# Patient Record
Sex: Female | Born: 1985
Health system: Southern US, Community
[De-identification: ages and names within clinical notes are randomized; demographics above are authoritative.]

---

## 2013-08-30 DIAGNOSIS — S82201B Unspecified fracture of shaft of right tibia, initial encounter for open fracture type I or II: Secondary | ICD-10-CM | POA: Insufficient documentation

## 2013-09-01 DIAGNOSIS — F1120 Opioid dependence, uncomplicated: Secondary | ICD-10-CM | POA: Insufficient documentation

## 2013-09-01 DIAGNOSIS — S93402A Sprain of unspecified ligament of left ankle, initial encounter: Secondary | ICD-10-CM | POA: Insufficient documentation

## 2013-09-01 DIAGNOSIS — S82209A Unspecified fracture of shaft of unspecified tibia, initial encounter for closed fracture: Secondary | ICD-10-CM | POA: Insufficient documentation

## 2019-05-26 ENCOUNTER — Emergency Department (HOSPITAL_COMMUNITY)
Admission: EM | Admit: 2019-05-26 | Discharge: 2019-05-26 | Disposition: A | Discharge status: Home / Self Care | Payer: Medicaid - Out of State | Attending: Emergency Medicine | Admitting: Emergency Medicine

## 2019-05-26 ENCOUNTER — Other Ambulatory Visit: Payer: Self-pay

## 2019-05-26 ENCOUNTER — Encounter (HOSPITAL_COMMUNITY): Payer: Self-pay | Admitting: Emergency Medicine

## 2019-05-26 DIAGNOSIS — Z5321 Procedure and treatment not carried out due to patient leaving prior to being seen by health care provider: Secondary | ICD-10-CM | POA: Insufficient documentation

## 2019-05-26 DIAGNOSIS — R519 Headache, unspecified: Secondary | ICD-10-CM | POA: Insufficient documentation
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
## 2019-05-27 ENCOUNTER — Encounter (HOSPITAL_COMMUNITY): Payer: Self-pay | Admitting: Emergency Medicine

## 2019-05-27 ENCOUNTER — Other Ambulatory Visit: Payer: Self-pay

## 2019-05-27 ENCOUNTER — Emergency Department (HOSPITAL_COMMUNITY)
Admission: EM | Admit: 2019-05-27 | Discharge: 2019-05-27 | Disposition: A | Discharge status: Home / Self Care | Payer: Medicaid - Out of State | Attending: Emergency Medicine | Admitting: Emergency Medicine

## 2019-05-27 DIAGNOSIS — R519 Headache, unspecified: Secondary | ICD-10-CM | POA: Diagnosis present

## 2019-05-27 DIAGNOSIS — Z5321 Procedure and treatment not carried out due to patient leaving prior to being seen by health care provider: Secondary | ICD-10-CM | POA: Insufficient documentation
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
## 2020-04-01 ENCOUNTER — Inpatient Hospital Stay (HOSPITAL_COMMUNITY)
Admission: EM | Admit: 2020-04-01 | Discharge: 2020-04-30 | DRG: 163 | Disposition: A | Discharge status: Home / Self Care | Payer: Medicaid Other | Attending: Internal Medicine | Admitting: Internal Medicine

## 2020-04-01 ENCOUNTER — Other Ambulatory Visit: Payer: Self-pay

## 2020-04-01 DIAGNOSIS — Z938 Other artificial opening status: Secondary | ICD-10-CM

## 2020-04-01 DIAGNOSIS — G629 Polyneuropathy, unspecified: Secondary | ICD-10-CM | POA: Diagnosis not present

## 2020-04-01 DIAGNOSIS — F32A Depression, unspecified: Secondary | ICD-10-CM | POA: Diagnosis present

## 2020-04-01 DIAGNOSIS — Z9689 Presence of other specified functional implants: Secondary | ICD-10-CM

## 2020-04-01 DIAGNOSIS — I071 Rheumatic tricuspid insufficiency: Secondary | ICD-10-CM | POA: Diagnosis present

## 2020-04-01 DIAGNOSIS — I313 Pericardial effusion (noninflammatory): Secondary | ICD-10-CM | POA: Diagnosis present

## 2020-04-01 DIAGNOSIS — F1510 Other stimulant abuse, uncomplicated: Secondary | ICD-10-CM | POA: Diagnosis present

## 2020-04-01 DIAGNOSIS — J90 Pleural effusion, not elsewhere classified: Secondary | ICD-10-CM

## 2020-04-01 DIAGNOSIS — J939 Pneumothorax, unspecified: Secondary | ICD-10-CM

## 2020-04-01 DIAGNOSIS — J850 Gangrene and necrosis of lung: Secondary | ICD-10-CM | POA: Diagnosis not present

## 2020-04-01 DIAGNOSIS — D75839 Thrombocytosis, unspecified: Secondary | ICD-10-CM

## 2020-04-01 DIAGNOSIS — U071 COVID-19: Secondary | ICD-10-CM

## 2020-04-01 DIAGNOSIS — J9811 Atelectasis: Secondary | ICD-10-CM | POA: Diagnosis present

## 2020-04-01 DIAGNOSIS — Z09 Encounter for follow-up examination after completed treatment for conditions other than malignant neoplasm: Secondary | ICD-10-CM

## 2020-04-01 DIAGNOSIS — R197 Diarrhea, unspecified: Secondary | ICD-10-CM | POA: Diagnosis present

## 2020-04-01 DIAGNOSIS — Z720 Tobacco use: Secondary | ICD-10-CM

## 2020-04-01 DIAGNOSIS — J9382 Other air leak: Secondary | ICD-10-CM | POA: Diagnosis not present

## 2020-04-01 DIAGNOSIS — E8809 Other disorders of plasma-protein metabolism, not elsewhere classified: Secondary | ICD-10-CM

## 2020-04-01 DIAGNOSIS — E871 Hypo-osmolality and hyponatremia: Secondary | ICD-10-CM

## 2020-04-01 DIAGNOSIS — F17210 Nicotine dependence, cigarettes, uncomplicated: Secondary | ICD-10-CM | POA: Diagnosis present

## 2020-04-01 DIAGNOSIS — J154 Pneumonia due to other streptococci: Secondary | ICD-10-CM | POA: Diagnosis present

## 2020-04-01 DIAGNOSIS — D539 Nutritional anemia, unspecified: Secondary | ICD-10-CM

## 2020-04-01 DIAGNOSIS — B953 Streptococcus pneumoniae as the cause of diseases classified elsewhere: Secondary | ICD-10-CM | POA: Diagnosis present

## 2020-04-01 DIAGNOSIS — Z681 Body mass index (BMI) 19 or less, adult: Secondary | ICD-10-CM

## 2020-04-01 DIAGNOSIS — R7989 Other specified abnormal findings of blood chemistry: Secondary | ICD-10-CM

## 2020-04-01 DIAGNOSIS — F1910 Other psychoactive substance abuse, uncomplicated: Secondary | ICD-10-CM | POA: Diagnosis present

## 2020-04-01 DIAGNOSIS — R9389 Abnormal findings on diagnostic imaging of other specified body structures: Secondary | ICD-10-CM | POA: Diagnosis present

## 2020-04-01 DIAGNOSIS — R634 Abnormal weight loss: Secondary | ICD-10-CM | POA: Diagnosis present

## 2020-04-01 DIAGNOSIS — F1990 Other psychoactive substance use, unspecified, uncomplicated: Secondary | ICD-10-CM | POA: Diagnosis present

## 2020-04-01 DIAGNOSIS — R7401 Elevation of levels of liver transaminase levels: Secondary | ICD-10-CM

## 2020-04-01 DIAGNOSIS — F1110 Opioid abuse, uncomplicated: Secondary | ICD-10-CM | POA: Diagnosis present

## 2020-04-01 DIAGNOSIS — R000 Tachycardia, unspecified: Secondary | ICD-10-CM

## 2020-04-01 DIAGNOSIS — E43 Unspecified severe protein-calorie malnutrition: Secondary | ICD-10-CM | POA: Insufficient documentation

## 2020-04-01 DIAGNOSIS — N644 Mastodynia: Secondary | ICD-10-CM | POA: Diagnosis not present

## 2020-04-01 DIAGNOSIS — F1410 Cocaine abuse, uncomplicated: Secondary | ICD-10-CM | POA: Diagnosis present

## 2020-04-01 DIAGNOSIS — J1282 Pneumonia due to coronavirus disease 2019: Secondary | ICD-10-CM | POA: Diagnosis present

## 2020-04-01 DIAGNOSIS — J9601 Acute respiratory failure with hypoxia: Secondary | ICD-10-CM | POA: Diagnosis present

## 2020-04-01 DIAGNOSIS — J869 Pyothorax without fistula: Principal | ICD-10-CM

## 2020-04-01 DIAGNOSIS — E222 Syndrome of inappropriate secretion of antidiuretic hormone: Secondary | ICD-10-CM | POA: Diagnosis present

## 2020-04-01 DIAGNOSIS — I959 Hypotension, unspecified: Secondary | ICD-10-CM

## 2020-04-01 DIAGNOSIS — J948 Other specified pleural conditions: Secondary | ICD-10-CM

## 2020-04-01 LAB — RESP PANEL BY RT-PCR (FLU A&B, COVID) ARPGX2
Influenza A by PCR: NEGATIVE
Influenza B by PCR: NEGATIVE
SARS Coronavirus 2 by RT PCR: POSITIVE — AB

## 2020-04-01 NOTE — ED Triage Notes (Signed)
Here with COVID s/s. Positive by affiliation- states that her family member in home was + 2 weeks ago. States that she has been having s/s for 6 days. Says that she has loss of taste and smell, cough, and has been feeling weak.

## 2020-04-02 ENCOUNTER — Inpatient Hospital Stay (HOSPITAL_COMMUNITY): Payer: Medicaid Other

## 2020-04-02 ENCOUNTER — Emergency Department (HOSPITAL_COMMUNITY): Payer: Medicaid Other

## 2020-04-02 ENCOUNTER — Encounter (HOSPITAL_COMMUNITY): Payer: Self-pay | Admitting: Internal Medicine

## 2020-04-02 DIAGNOSIS — J9811 Atelectasis: Secondary | ICD-10-CM | POA: Diagnosis present

## 2020-04-02 DIAGNOSIS — I959 Hypotension, unspecified: Secondary | ICD-10-CM | POA: Diagnosis present

## 2020-04-02 DIAGNOSIS — E8809 Other disorders of plasma-protein metabolism, not elsewhere classified: Secondary | ICD-10-CM

## 2020-04-02 DIAGNOSIS — F1990 Other psychoactive substance use, unspecified, uncomplicated: Secondary | ICD-10-CM | POA: Diagnosis present

## 2020-04-02 DIAGNOSIS — I071 Rheumatic tricuspid insufficiency: Secondary | ICD-10-CM | POA: Diagnosis present

## 2020-04-02 DIAGNOSIS — I313 Pericardial effusion (noninflammatory): Secondary | ICD-10-CM | POA: Diagnosis present

## 2020-04-02 DIAGNOSIS — R7989 Other specified abnormal findings of blood chemistry: Secondary | ICD-10-CM | POA: Diagnosis present

## 2020-04-02 DIAGNOSIS — Z681 Body mass index (BMI) 19 or less, adult: Secondary | ICD-10-CM | POA: Diagnosis not present

## 2020-04-02 DIAGNOSIS — E43 Unspecified severe protein-calorie malnutrition: Secondary | ICD-10-CM | POA: Diagnosis present

## 2020-04-02 DIAGNOSIS — J95812 Postprocedural air leak: Secondary | ICD-10-CM | POA: Diagnosis not present

## 2020-04-02 DIAGNOSIS — U071 COVID-19: Secondary | ICD-10-CM | POA: Diagnosis present

## 2020-04-02 DIAGNOSIS — J939 Pneumothorax, unspecified: Secondary | ICD-10-CM | POA: Diagnosis present

## 2020-04-02 DIAGNOSIS — J90 Pleural effusion, not elsewhere classified: Secondary | ICD-10-CM

## 2020-04-02 DIAGNOSIS — J948 Other specified pleural conditions: Secondary | ICD-10-CM | POA: Diagnosis present

## 2020-04-02 DIAGNOSIS — R000 Tachycardia, unspecified: Secondary | ICD-10-CM | POA: Diagnosis present

## 2020-04-02 DIAGNOSIS — J1282 Pneumonia due to coronavirus disease 2019: Secondary | ICD-10-CM | POA: Diagnosis present

## 2020-04-02 DIAGNOSIS — F32A Depression, unspecified: Secondary | ICD-10-CM | POA: Diagnosis present

## 2020-04-02 DIAGNOSIS — F1410 Cocaine abuse, uncomplicated: Secondary | ICD-10-CM | POA: Diagnosis present

## 2020-04-02 DIAGNOSIS — E871 Hypo-osmolality and hyponatremia: Secondary | ICD-10-CM

## 2020-04-02 DIAGNOSIS — J869 Pyothorax without fistula: Secondary | ICD-10-CM | POA: Diagnosis present

## 2020-04-02 DIAGNOSIS — I38 Endocarditis, valve unspecified: Secondary | ICD-10-CM | POA: Diagnosis not present

## 2020-04-02 DIAGNOSIS — Z8616 Personal history of COVID-19: Secondary | ICD-10-CM | POA: Diagnosis not present

## 2020-04-02 DIAGNOSIS — D539 Nutritional anemia, unspecified: Secondary | ICD-10-CM | POA: Diagnosis not present

## 2020-04-02 DIAGNOSIS — J154 Pneumonia due to other streptococci: Secondary | ICD-10-CM | POA: Diagnosis present

## 2020-04-02 DIAGNOSIS — Z9689 Presence of other specified functional implants: Secondary | ICD-10-CM | POA: Diagnosis not present

## 2020-04-02 DIAGNOSIS — D75839 Thrombocytosis, unspecified: Secondary | ICD-10-CM | POA: Diagnosis present

## 2020-04-02 DIAGNOSIS — J9601 Acute respiratory failure with hypoxia: Secondary | ICD-10-CM | POA: Diagnosis present

## 2020-04-02 DIAGNOSIS — E222 Syndrome of inappropriate secretion of antidiuretic hormone: Secondary | ICD-10-CM | POA: Diagnosis present

## 2020-04-02 DIAGNOSIS — R7401 Elevation of levels of liver transaminase levels: Secondary | ICD-10-CM | POA: Diagnosis not present

## 2020-04-02 DIAGNOSIS — F17210 Nicotine dependence, cigarettes, uncomplicated: Secondary | ICD-10-CM | POA: Diagnosis present

## 2020-04-02 DIAGNOSIS — J9382 Other air leak: Secondary | ICD-10-CM | POA: Diagnosis not present

## 2020-04-02 DIAGNOSIS — J850 Gangrene and necrosis of lung: Secondary | ICD-10-CM | POA: Diagnosis not present

## 2020-04-02 DIAGNOSIS — Z720 Tobacco use: Secondary | ICD-10-CM

## 2020-04-02 LAB — CBC WITH DIFFERENTIAL/PLATELET
Abs Immature Granulocytes: 0.19 10*3/uL — ABNORMAL HIGH (ref 0.00–0.07)
Basophils Absolute: 0.1 10*3/uL (ref 0.0–0.1)
Basophils Relative: 1 %
Eosinophils Absolute: 0 10*3/uL (ref 0.0–0.5)
Eosinophils Relative: 0 %
HCT: 36.2 % (ref 36.0–46.0)
Hemoglobin: 12.3 g/dL (ref 12.0–15.0)
Immature Granulocytes: 2 %
Lymphocytes Relative: 19 %
Lymphs Abs: 1.8 10*3/uL (ref 0.7–4.0)
MCH: 28.6 pg (ref 26.0–34.0)
MCHC: 34 g/dL (ref 30.0–36.0)
MCV: 84.2 fL (ref 80.0–100.0)
Monocytes Absolute: 0.7 10*3/uL (ref 0.1–1.0)
Monocytes Relative: 7 %
Neutro Abs: 6.8 10*3/uL (ref 1.7–7.7)
Neutrophils Relative %: 71 %
Platelets: 558 10*3/uL — ABNORMAL HIGH (ref 150–400)
RBC: 4.3 MIL/uL (ref 3.87–5.11)
RDW: 13.8 % (ref 11.5–15.5)
WBC: 9.5 10*3/uL (ref 4.0–10.5)
nRBC: 0 % (ref 0.0–0.2)

## 2020-04-02 LAB — COMPREHENSIVE METABOLIC PANEL
ALT: 41 U/L (ref 0–44)
AST: 50 U/L — ABNORMAL HIGH (ref 15–41)
Albumin: 2.4 g/dL — ABNORMAL LOW (ref 3.5–5.0)
Alkaline Phosphatase: 68 U/L (ref 38–126)
Anion gap: 10 (ref 5–15)
BUN: 15 mg/dL (ref 6–20)
CO2: 28 mmol/L (ref 22–32)
Calcium: 8.5 mg/dL — ABNORMAL LOW (ref 8.9–10.3)
Chloride: 87 mmol/L — ABNORMAL LOW (ref 98–111)
Creatinine, Ser: 0.69 mg/dL (ref 0.44–1.00)
GFR, Estimated: >60 mL/min (ref >60)
Glucose, Bld: 115 mg/dL — ABNORMAL HIGH (ref 70–99)
Potassium: 3.9 mmol/L (ref 3.5–5.1)
Sodium: 125 mmol/L — ABNORMAL LOW (ref 135–145)
Total Bilirubin: 0.7 mg/dL (ref 0.3–1.2)
Total Protein: 7.9 g/dL (ref 6.5–8.1)

## 2020-04-02 LAB — BODY FLUID CELL COUNT WITH DIFFERENTIAL
Eos, Fluid: 0 %
Lymphs, Fluid: 2 %
Monocyte-Macrophage-Serous Fluid: 33 % — ABNORMAL LOW (ref 50–90)
Neutrophil Count, Fluid: 65 % — ABNORMAL HIGH (ref 0–25)
Total Nucleated Cell Count, Fluid: 119595 cu mm — ABNORMAL HIGH (ref 0–1000)

## 2020-04-02 LAB — SEDIMENTATION RATE: Sed Rate: 98 mm/hr — ABNORMAL HIGH (ref 0–22)

## 2020-04-02 LAB — BASIC METABOLIC PANEL
Anion gap: 9 (ref 5–15)
Anion gap: 15 (ref 5–15)
BUN: 16 mg/dL (ref 6–20)
BUN: 21 mg/dL — ABNORMAL HIGH (ref 6–20)
CO2: 29 mmol/L (ref 22–32)
CO2: 26 mmol/L (ref 22–32)
Calcium: 8.4 mg/dL — ABNORMAL LOW (ref 8.9–10.3)
Calcium: 8.3 mg/dL — ABNORMAL LOW (ref 8.9–10.3)
Chloride: 87 mmol/L — ABNORMAL LOW (ref 98–111)
Chloride: 94 mmol/L — ABNORMAL LOW (ref 98–111)
Creatinine, Ser: 0.7 mg/dL (ref 0.44–1.00)
Creatinine, Ser: 0.7 mg/dL (ref 0.44–1.00)
GFR, Estimated: >60 mL/min (ref >60)
GFR, Estimated: >60 mL/min (ref >60)
Glucose, Bld: 110 mg/dL — ABNORMAL HIGH (ref 70–99)
Glucose, Bld: 96 mg/dL (ref 70–99)
Potassium: 4.3 mmol/L (ref 3.5–5.1)
Potassium: 3.9 mmol/L (ref 3.5–5.1)
Sodium: 125 mmol/L — ABNORMAL LOW (ref 135–145)
Sodium: 135 mmol/L (ref 135–145)

## 2020-04-02 LAB — PHOSPHORUS: Phosphorus: 4.2 mg/dL (ref 2.5–4.6)

## 2020-04-02 LAB — PROTEIN, PLEURAL OR PERITONEAL FLUID: Total protein, fluid: 5.3 g/dL

## 2020-04-02 LAB — D-DIMER, QUANTITATIVE: D-Dimer, Quant: 1.69 ug/mL-FEU — ABNORMAL HIGH (ref 0.00–0.50)

## 2020-04-02 LAB — CBG MONITORING, ED
Glucose-Capillary: 101 mg/dL — ABNORMAL HIGH (ref 70–99)
Glucose-Capillary: 116 mg/dL — ABNORMAL HIGH (ref 70–99)
Glucose-Capillary: 117 mg/dL — ABNORMAL HIGH (ref 70–99)

## 2020-04-02 LAB — GRAM STAIN

## 2020-04-02 LAB — OSMOLALITY: Osmolality: 277 mOsm/kg (ref 275–295)

## 2020-04-02 LAB — LACTATE DEHYDROGENASE, PLEURAL OR PERITONEAL FLUID: LD, Fluid: >10000 U/L — ABNORMAL HIGH (ref 3–23)

## 2020-04-02 LAB — OSMOLALITY, URINE: Osmolality, Ur: 277 mOsm/kg — ABNORMAL LOW (ref 300–900)

## 2020-04-02 LAB — GLUCOSE, PLEURAL OR PERITONEAL FLUID: Glucose, Fluid: 18 mg/dL

## 2020-04-02 LAB — GLUCOSE, CAPILLARY: Glucose-Capillary: 103 mg/dL — ABNORMAL HIGH (ref 70–99)

## 2020-04-02 LAB — MAGNESIUM: Magnesium: 1.8 mg/dL (ref 1.7–2.4)

## 2020-04-02 LAB — HIV ANTIBODY (ROUTINE TESTING W REFLEX): HIV Screen 4th Generation wRfx: NONREACTIVE

## 2020-04-02 MED ORDER — NICOTINE 14 MG/24HR TD PT24
14.0000 mg | MEDICATED_PATCH | Freq: Every day | TRANSDERMAL | Status: DC
  Administered 2020-04-02 – 2020-04-14 (×8): 14 mg via TRANSDERMAL
  Filled 2020-04-02 (×18): qty 1

## 2020-04-02 MED ORDER — HYDROXYZINE HCL 25 MG PO TABS: 25.0000 mg | ORAL_TABLET | Freq: Three times a day (TID) | ORAL | Status: DC | PRN

## 2020-04-02 MED ORDER — INSULIN ASPART 100 UNIT/ML ~~LOC~~ SOLN
0.0000 [IU] | Freq: Three times a day (TID) | SUBCUTANEOUS | Status: DC
  Administered 2020-04-05: 2 [IU] via SUBCUTANEOUS
  Administered 2020-04-06: 1 [IU] via SUBCUTANEOUS

## 2020-04-02 MED ORDER — ALBUTEROL SULFATE HFA 108 (90 BASE) MCG/ACT IN AERS
2.0000 | INHALATION_SPRAY | Freq: Four times a day (QID) | RESPIRATORY_TRACT | Status: DC
  Administered 2020-04-02 – 2020-04-12 (×39): 2 via RESPIRATORY_TRACT
  Filled 2020-04-02 (×2): qty 6.7

## 2020-04-02 MED ORDER — SODIUM CHLORIDE 0.9 % IV SOLN
100.0000 mg | Freq: Once | INTRAVENOUS | Status: AC
  Administered 2020-04-02: 100 mg via INTRAVENOUS
  Filled 2020-04-02: qty 20

## 2020-04-02 MED ORDER — KETOROLAC TROMETHAMINE 30 MG/ML IJ SOLN
30.0000 mg | Freq: Once | INTRAMUSCULAR | Status: AC
  Administered 2020-04-02: 30 mg via INTRAVENOUS
  Filled 2020-04-02: qty 1

## 2020-04-02 MED ORDER — DEXAMETHASONE SODIUM PHOSPHATE 10 MG/ML IJ SOLN
10.0000 mg | Freq: Once | INTRAMUSCULAR | Status: AC
  Administered 2020-04-02: 10 mg via INTRAVENOUS
  Filled 2020-04-02: qty 1

## 2020-04-02 MED ORDER — INSULIN ASPART 100 UNIT/ML ~~LOC~~ SOLN: 0.0000 [IU] | Freq: Every day | SUBCUTANEOUS | Status: DC

## 2020-04-02 MED ORDER — ALPRAZOLAM 0.5 MG PO TABS
0.5000 mg | ORAL_TABLET | Freq: Three times a day (TID) | ORAL | Status: DC | PRN
  Administered 2020-04-02 – 2020-04-29 (×47): 0.5 mg via ORAL
  Filled 2020-04-02 (×50): qty 1

## 2020-04-02 MED ORDER — PIPERACILLIN-TAZOBACTAM 3.375 G IVPB
3.3750 g | Freq: Three times a day (TID) | INTRAVENOUS | Status: DC
  Administered 2020-04-02 – 2020-04-05 (×8): 3.375 g via INTRAVENOUS
  Filled 2020-04-02 (×9): qty 50

## 2020-04-02 MED ORDER — ACETAMINOPHEN 325 MG PO TABS
650.0000 mg | ORAL_TABLET | Freq: Four times a day (QID) | ORAL | Status: DC | PRN
  Administered 2020-04-02: 650 mg via ORAL
  Filled 2020-04-02 (×3): qty 2

## 2020-04-02 MED ORDER — PREDNISONE 5 MG PO TABS
50.0000 mg | ORAL_TABLET | Freq: Every day | ORAL | Status: DC
  Administered 2020-04-05: 50 mg via ORAL
  Filled 2020-04-02: qty 2

## 2020-04-02 MED ORDER — GUAIFENESIN-DM 100-10 MG/5ML PO SYRP
10.0000 mL | ORAL_SOLUTION | ORAL | Status: DC | PRN
  Administered 2020-04-04 – 2020-04-07 (×6): 10 mL via ORAL
  Filled 2020-04-02 (×6): qty 10

## 2020-04-02 MED ORDER — VANCOMYCIN HCL 1250 MG/250ML IV SOLN: 1250.0000 mg | INTRAVENOUS | Status: DC

## 2020-04-02 MED ORDER — SODIUM CHLORIDE 0.9 % IV SOLN: 100.0000 mg | Freq: Every day | INTRAVENOUS | Status: DC

## 2020-04-02 MED ORDER — ENSURE ENLIVE PO LIQD
237.0000 mL | Freq: Two times a day (BID) | ORAL | Status: DC
  Filled 2020-04-02 (×8): qty 237

## 2020-04-02 MED ORDER — PIPERACILLIN-TAZOBACTAM 3.375 G IVPB 30 MIN
3.3750 g | Freq: Once | INTRAVENOUS | Status: AC
  Administered 2020-04-02: 3.375 g via INTRAVENOUS
  Filled 2020-04-02: qty 50

## 2020-04-02 MED ORDER — IOHEXOL 350 MG/ML SOLN
100.0000 mL | Freq: Once | INTRAVENOUS | Status: AC | PRN
  Administered 2020-04-02: 100 mL via INTRAVENOUS

## 2020-04-02 MED ORDER — METHYLPREDNISOLONE SODIUM SUCC 40 MG IJ SOLR
0.5000 mg/kg | Freq: Two times a day (BID) | INTRAMUSCULAR | Status: AC
  Administered 2020-04-02 – 2020-04-04 (×6): 26 mg via INTRAVENOUS
  Filled 2020-04-02 (×7): qty 1

## 2020-04-02 MED ORDER — VANCOMYCIN HCL 500 MG/100ML IV SOLN
500.0000 mg | Freq: Two times a day (BID) | INTRAVENOUS | Status: DC
  Administered 2020-04-03: 500 mg via INTRAVENOUS
  Filled 2020-04-02 (×6): qty 100

## 2020-04-02 MED ORDER — VANCOMYCIN HCL 1250 MG/250ML IV SOLN
1250.0000 mg | Freq: Once | INTRAVENOUS | Status: AC
  Administered 2020-04-02: 1250 mg via INTRAVENOUS
  Filled 2020-04-02: qty 250

## 2020-04-02 MED ORDER — SODIUM CHLORIDE 0.9 % IV SOLN
100.0000 mg | Freq: Every day | INTRAVENOUS | Status: AC
  Administered 2020-04-03 – 2020-04-06 (×4): 100 mg via INTRAVENOUS
  Filled 2020-04-02 (×3): qty 20
  Filled 2020-04-02: qty 2.78

## 2020-04-02 MED ORDER — ALBUTEROL SULFATE HFA 108 (90 BASE) MCG/ACT IN AERS
2.0000 | INHALATION_SPRAY | RESPIRATORY_TRACT | Status: DC | PRN
  Administered 2020-04-04: 2 via RESPIRATORY_TRACT
  Filled 2020-04-02: qty 6.7

## 2020-04-02 MED ORDER — HYDROCOD POLST-CPM POLST ER 10-8 MG/5ML PO SUER
5.0000 mL | Freq: Two times a day (BID) | ORAL | Status: DC | PRN
  Administered 2020-04-03: 5 mL via ORAL
  Filled 2020-04-02: qty 5

## 2020-04-02 MED ORDER — DM-GUAIFENESIN ER 30-600 MG PO TB12
1.0000 | ORAL_TABLET | Freq: Two times a day (BID) | ORAL | Status: DC
  Administered 2020-04-02 – 2020-04-30 (×49): 1 via ORAL
  Filled 2020-04-02 (×54): qty 1

## 2020-04-02 MED ORDER — SODIUM CHLORIDE 0.9 % IV SOLN: 200.0000 mg | Freq: Once | INTRAVENOUS | Status: DC

## 2020-04-02 MED ORDER — SODIUM CHLORIDE 0.9 % IV SOLN: Freq: Once | INTRAVENOUS | Status: AC

## 2020-04-02 MED ORDER — PANTOPRAZOLE SODIUM 40 MG PO TBEC
40.0000 mg | DELAYED_RELEASE_TABLET | Freq: Every day | ORAL | Status: DC
  Administered 2020-04-03 – 2020-04-30 (×27): 40 mg via ORAL
  Filled 2020-04-02 (×28): qty 1

## 2020-04-02 MED ORDER — BUSPIRONE HCL 10 MG PO TABS
10.0000 mg | ORAL_TABLET | Freq: Three times a day (TID) | ORAL | Status: DC
  Administered 2020-04-02 – 2020-04-30 (×83): 10 mg via ORAL
  Filled 2020-04-02 (×2): qty 2
  Filled 2020-04-02: qty 1
  Filled 2020-04-02: qty 2
  Filled 2020-04-02 (×2): qty 1
  Filled 2020-04-02: qty 2
  Filled 2020-04-02 (×4): qty 1
  Filled 2020-04-02: qty 2
  Filled 2020-04-02 (×6): qty 1
  Filled 2020-04-02 (×2): qty 2
  Filled 2020-04-02 (×3): qty 1
  Filled 2020-04-02: qty 2
  Filled 2020-04-02 (×3): qty 1
  Filled 2020-04-02: qty 2
  Filled 2020-04-02 (×4): qty 1
  Filled 2020-04-02 (×6): qty 2
  Filled 2020-04-02 (×6): qty 1
  Filled 2020-04-02 (×3): qty 2
  Filled 2020-04-02: qty 1
  Filled 2020-04-02 (×3): qty 2
  Filled 2020-04-02 (×2): qty 1
  Filled 2020-04-02: qty 2
  Filled 2020-04-02 (×3): qty 1
  Filled 2020-04-02: qty 2
  Filled 2020-04-02 (×2): qty 1
  Filled 2020-04-02: qty 2
  Filled 2020-04-02 (×3): qty 1
  Filled 2020-04-02 (×3): qty 2
  Filled 2020-04-02: qty 1
  Filled 2020-04-02: qty 2
  Filled 2020-04-02 (×4): qty 1
  Filled 2020-04-02: qty 2
  Filled 2020-04-02: qty 1
  Filled 2020-04-02: qty 2
  Filled 2020-04-02: qty 1
  Filled 2020-04-02: qty 2
  Filled 2020-04-02 (×3): qty 1
  Filled 2020-04-02 (×2): qty 2

## 2020-04-02 MED ORDER — TRAZODONE HCL 50 MG PO TABS
100.0000 mg | ORAL_TABLET | Freq: Every day | ORAL | Status: DC
  Administered 2020-04-02 – 2020-04-29 (×28): 100 mg via ORAL
  Filled 2020-04-02: qty 2
  Filled 2020-04-02 (×2): qty 1
  Filled 2020-04-02: qty 2
  Filled 2020-04-02: qty 1
  Filled 2020-04-02 (×3): qty 2
  Filled 2020-04-02: qty 1
  Filled 2020-04-02 (×16): qty 2
  Filled 2020-04-02: qty 1
  Filled 2020-04-02: qty 2

## 2020-04-02 MED ORDER — LACTATED RINGERS IV BOLUS
1000.0000 mL | Freq: Once | INTRAVENOUS | Status: AC
  Administered 2020-04-02: 1000 mL via INTRAVENOUS

## 2020-04-02 NOTE — Progress Notes (Signed)
Patient planned for placement of large bore chest with moderate sedation for left empyema 04/03/20 at 0900 in CT at [facility] - patient may have bed available for today at [facility], however currently in [facility] ED.   Discussed patient with Casimiro Needle, RN at [facility] who confirms patient is planned for bed placement, she will also require midline placement due to poor venous access - in the event she is not able to receive a bed at [facility] prior to tomorrow's procedure she will need to be transported to [facility] via [facility] by 0900. She will need to be brought directly to CT1 for procedure given her COVID (+) status. Casimiro Needle, RN to setup [facility] so that we can proceed even if she is not transferred to [facility] before tomorrow's procedure. If she is brought to [facility] tonight we will call for patient when ready for 0900 procedure.  Patient to be NPO after midnight, hold anticoagulation until post procedure, AM labs ordered. IR PA will see for consult/consent when arrives to [facility].  Please call on call interventional radiologist with any questions or concerns overnight/weekend.  Lynnette Caffey, PA-C
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
## 2020-04-02 NOTE — Progress Notes (Addendum)
Pharmacy Antibiotic Note  Alyssa Vargas is a 34 y.o. female admitted on 04/01/2020 with pneumonia.  Pharmacy has been consulted for Zosyn dosing.  Plan: Vancomycin 1250 mg IV x 1 dose. Vancomycin 500 mg IV every 12 hours. Zosyn 3.375g IV every 8 hours. Monitor labs, c/s, and patient improvement.  Height: 5\' 8"  (172.7 cm) Weight: 52.2 kg (115 lb) IBW/kg (Calculated) : 63.9  Temp (24hrs), Avg:98.1 F (36.7 C), Min:98 F (36.7 C), Max:98.1 F (36.7 C)  Recent Labs  Lab 04/02/20 0158 04/02/20 0728  WBC 9.5  --   CREATININE 0.69 0.70    Estimated Creatinine Clearance: 83.2 mL/min (by C-G formula based on SCr of 0.7 mg/dL).    No Known Allergies  Antimicrobials this admission: Zosyn 12/17 >> Vanco 12/17 >> Remdesivir 12/17 >>    12/17 BCx >> pending 12/17 Pleural fluid: pending COVID +    Thank you for allowing pharmacy to be a part of this patient's care.  1/18 04/02/2020 1:33 PM
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
## 2020-04-02 NOTE — Progress Notes (Addendum)
Patient Active Problem List   Diagnosis Date Noted  . Massive left-sided empyema lung with mediastinal shift 04/02/2020    Priority: High  . 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 COVID-19 virus infection 04/02/2020  . Pleural effusion on left 04/02/2020  . Tobacco abuse 04/02/2020  . Hyponatremia 04/02/2020  . Hypoalbuminemia 04/02/2020  . Thrombocytosis 04/02/2020  . Elevated d-dimer 04/02/2020      Patient seen and evaluated, chart reviewed, please see EMR for updated orders. Please see full H&P dictated by admitting physician Dr. Thomes Dinning for same date of service.    Brief Summary:- 34 yo WF with H/o IVDU (iv Heroin and iv Methamphetamine) and Tobacco abuse admitted on 04/02/2020 with dyspnea, productive cough, loss of sense of taste and smell, anorexia, generalized aches, pains, malaise fatigue and myalgias and found to be positive for COVID-19 infection with chest x-ray consistent with very very large left-sided pleural effusion with mediastinal shift,-left-sided thoracentesis on 04/02/2020 you did purulent fluid consistent with empyema with Gram stain showing GPC -Pulmonology and CT surgery consult appreciated -Patient awaiting IR to place large bore chest tube under CT guidance  A/p 1)Large left-sided Empyema--- as per Dr Tyrone Sage from cardiothoracic surgery we need to consult IR to place large bore chest tube to drain purulent / empyema fluid--- no plans for VATS at this time -IR consult from Dr. Fredia Sorrow requested, -- pt is scheduled  for CT guided chest tube placement on 04/03/20 at 0900 am at [facility] -Pleural fluid studies pending,  --started on IV Zosyn and Vancomycin on 04/02/20 by Dr Sherene Sires (PCCM) -Blood cultures and transthoracic echo requested -Status post left-sided ultrasound-guided thoracentesis on 04/02/2020 with   1.88 L of cloudy tan particulate-containing purulent appearing  -Gram stain with  GPC -Given IVDU, low threshold for getting TEE -Transfer to [facility]  2)Covid 19 Infection--- this does NOT appear to be the primary driver for patient's respiratory symptoms at this time -We will check and trend inflammatory markers -Continue IV steroids and remdesivir as ordered by admitting physician -Continue bronchodilators -Supplemental oxygen as ordered  3) acute Hypoxic respiratory Failure--- mostly due to #1 above -Management as above #1 #2   4)Depression and Anorexia--- denies suicidal or homicidal ideation or plan, will benefit from TTS/behavioral health consult prior to discharge -xanax prn   5) hyponatremia--multifactorial, hydrate, recheck BMP in a.m.  6)H/o IVDU (iv Heroin and iv Methamphetamine) --- patient admits that she last used IV methamphetamine and IV heroin on Sunday, 12/28/2019--patient is Not interested in drug rehab at this time  7) Tobacco abuse --- nicotine patch requested  --Total care time over 43 minutes  --Case discussed with pulmonary critical care service Dr. Sandrea Hughs, cardiothoracic surgery service Dr. Tyrone Sage...   And interventional radiology Dr. Fredia Sorrow -- Patient seen and evaluated, chart reviewed, please see EMR for updated orders. Please see full H&P dictated by admitting physician Dr. Thomes Dinning for same date of service.   -

## 2020-04-02 NOTE — Consult Note (Signed)
NAME:  Alyssa Vargas, MRN:  [id number], DOB:  1986/03/19, LOS: 0 ADMISSION DATE:  04/01/2020, CONSULTATION DATE:  12/17 REFERRING MD:  Dondra Prader , CHIEF COMPLAINT:  Massive L effusion in covid Pos pt    Brief History:  34 yowf smoker sick x either 1 week or 4 weeks (not clear which) with sob on prsentation to er 12/16 with tension hydrothorax which proved to by empyema on Tap 12/17 so PCCM asked to consult   History of Present Illness:    34 y.o. female with medical history significant for tobacco abuse who presents to the emergency department due to 1 week of worsening shortness of breath associated with cough, loss of sense of taste with decreased appetite and generalized body aches. Patient states that she was exposed to someone with Covid, she complained of chills and sweats, but was unsure if she had fever. She states that she has lost about 18 pounds within 1 month.  H/o IV drug use noted         Past Medical History:   Smoker without obvious sequelae, on no meds PTA  Significant Hospital Events:    Consults:  PCCM  12/17  Procedures:  L Tecentesis  12/17 :    1880 cc  purulent fluid c/w empyema with glucose 18  Echo 12/17 >>>  Significant Diagnostic Tests:  CT chest pre tap 12/17  There is a massive left pleural effusion which completely fills the left hemithorax, completely collapses the left lung, and demonstrates marked mass effect upon the mediastinum with marked mediastinal shift to the right. There is interstitial gas within a portion of the left lower lobe likely representing the lateral segment of the left lower lobe which may represent necrosis of the setting of necrotizing pneumonia. Mild patchy ground-glass infiltrate within the a right upper lobe anteriorly is nonspecific, possibly infectious or inflammatory. A bilobed fluid density structure is seen within the right middle lobe measuring 2.3 x 3.6 x 1.6 cm in greatest dimension, nonspecific, but possibly  the sequela of prior infection or trauma.  Micro Data:  Resp viral panel  12/16   POS COVID  Tcentesis  2/17 >>> gpc's >>>  Antimicrobials:  Remdesovir 12/17 >>> Zosyn 12/17 >>> gpc's Vanc 12/17   Scheduled Meds: . albuterol  2 puff Inhalation Q6H  . dextromethorphan-guaiFENesin  1 tablet Oral BID  . feeding supplement  237 mL Oral BID BM  . insulin aspart  0-5 Units Subcutaneous QHS  . insulin aspart  0-9 Units Subcutaneous TID WC  . methylPREDNISolone (SOLU-MEDROL) injection  0.5 mg/kg Intravenous Q12H   Followed by  . [START ON 04/05/2020] predniSONE  50 mg Oral Daily  . pantoprazole  40 mg Oral Daily   Continuous Infusions: . [START ON 04/03/2020] remdesivir 100 mg in NS 100 mL     PRN Meds:.acetaminophen, albuterol, chlorpheniramine-HYDROcodone, guaiFENesin-dextromethorphan    Interim History / Subjective:  Able to lie flat in fetal position L side down p tap today   Objective   Blood pressure 103/76, pulse (!) 110, temperature 98.1 F (36.7 C), temperature source Oral, resp. rate (!) 25, height 5\' 8"  (1.727 m), weight 52.2 kg, SpO2 95 %.        Intake/Output Summary (Last 24 hours) at 04/02/2020 1223 Last data filed at 04/02/2020 0824 Gross per 24 hour  Intake 5461.2 ml  Output --  Net 5461.2 ml   Filed Weights   04/01/20 2125  Weight: 52.2 kg    Examination: Tmax  98.1  General: chronically > acutely ill Lungs: decreaesed bs on L  Cardiovascular: RRR  Abdomen: soft Extremities: warm s edema Neuro: intact      I personally reviewed images and agree with radiology impression as follows:  CXR:   12/17 p tap portable: No pneumothorax following LEFT thoracentesis.  Resolution of LEFT to RIGHT mediastinal shift.  Persistent nodular foci at inferior RIGHT hemithorax.     Resolved Hospital Problem list      Assessment & Plan:   1)  Necrotizing Pna L lung with empyema in setting of covid 19  >>>  rx zosyn/vanc  pending ID and  sensivity >>> agree will need tx to [facility]/ cvts eval   2) Covd 19 pna without resp failure so far  3) Hyponatremia  ? siadh  4)  Smoker prior to admit    Clinical presentation is more c/w bacterial pna/empyema with superimposed COVID 19 than covid c/b bacterial pna but it's difficult to sort out.    >>> needs trx to [facility] for T surgery consultation/ pccm service can see there prn   Best practice (evaluated daily)  Diet: per [facility] Pain/Anxiety/Delirium protocol (if indicated): per [facility]  VAP protocol (if indicated):  DVT prophylaxis: per [facility] GI prophylaxis: per [facility] Glucose control: per [facility] Mobility: up as tol Disposition:to [facility]      Labs   CBC: Recent Labs  Lab 04/02/20 0158  WBC 9.5  NEUTROABS 6.8  HGB 12.3  HCT 36.2  MCV 84.2  PLT 558*    Basic Metabolic Panel: Recent Labs  Lab 04/02/20 0158 04/02/20 0728  NA 125* 125*  K 3.9 4.3  CL 87* 87*  CO2 28 29  GLUCOSE 115* 110*  BUN 15 16  CREATININE 0.69 0.70  CALCIUM 8.5* 8.4*  MG 1.8  --   PHOS 4.2  --    GFR: Estimated Creatinine Clearance: 83.2 mL/min (by C-G formula based on SCr of 0.7 mg/dL). Recent Labs  Lab 04/02/20 0158  WBC 9.5    Liver Function Tests: Recent Labs  Lab 04/02/20 0158  AST 50*  ALT 41  ALKPHOS 68  BILITOT 0.7  PROT 7.9  ALBUMIN 2.4*   No results for input(s): LIPASE, AMYLASE in the last 168 hours. No results for input(s): AMMONIA in the last 168 hours.  ABG No results found for: PHART, PCO2ART, PO2ART, HCO3, TCO2, ACIDBASEDEF, O2SAT   Coagulation Profile: No results for input(s): INR, PROTIME in the last 168 hours.  Cardiac Enzymes: No results for input(s): CKTOTAL, CKMB, CKMBINDEX, TROPONINI in the last 168 hours.  HbA1C: No results found for: HGBA1C  CBG: Recent Labs  Lab 04/02/20 1016 04/02/20 1120  GLUCAP 101* 116*       Past Medical History:  She,  has no past medical history on file.   Surgical History:  No past surgical history on file.    Social History:   reports that she has been smoking cigarettes. She has been smoking about 0.50 packs per day. She has never used smokeless tobacco. She reports previous alcohol use. She reports previous drug use.   Family History:  Her family history is not on file.   Allergies No Known Allergies   Home Medications  Prior to Admission medications   Not on File       Sandrea Hughs, MD Pulmonary and Critical Care Medicine [facility] Cell [phone]   After 7:00 pm call Elink  [phone]

## 2020-04-02 NOTE — ED Notes (Signed)
Report given to [facility]. 

## 2020-04-02 NOTE — ED Notes (Signed)
Date and time results received: 04/02/20 1440 (use smartphrase ".now" to insert current time)  Test: Pleural Fluid Stain Critical Value: Gram Positive Cocci  Name of Provider Notified: Jayme Cloud, MD  Orders Received? Or Actions Taken?:

## 2020-04-02 NOTE — Procedures (Signed)
PreOperative Dx: LEFT pleural effusion Postoperative Dx: LEFT empyema Procedure:   US guided left thoracentesis Radiologist:  Tyron Russell Anesthesia:  10 ml of 1% lidocaine Specimen:  1.88 L of cloudy tan particulate-containing purulent appearing fluid EBL:   < 1 ml Complications: None

## 2020-04-02 NOTE — Sedation Documentation (Signed)
PT tolerated left sided thoracentesis procedure well and labs collected and sent for processing. 1880 mL of purulent tannish cloudy fluid removed and Hospitalist made aware of description of pleural fluid. Post chest xray performed at bedside post procedure. Vital signs stable at completion of procedure.

## 2020-04-02 NOTE — ED Provider Notes (Signed)
[facility] EMERGENCY DEPARTMENT Provider Note   CSN: [id number] Arrival date & time: 04/01/20  2112   History Chief Complaint  Patient presents with  . Covid Exposure    Alyssa Vargas is a 34 y.o. female.  The history is provided by the patient.  She was exposed to COVID-19, and started getting sick about 1 week ago.  She has had a nonproductive cough, shortness of breath, loss of sense of smell, loss of sense of taste, generalized body aches.  She has also had some mild diarrhea.  She denies nausea or vomiting.  There have been chills and sweats but she has not had any documented fevers.  She has not tried any treatment for any of the symptoms.  She usually smokes cigarettes, but has not been able to smoke since she got sick.  No past medical history on file.  There are no problems to display for this patient.   No past surgical history on file.   OB History   No obstetric history on file.     No family history on file.  Social History   Tobacco Use  . Smoking status: Current Every Day Smoker    Packs/day: 0.50    Types: Cigarettes  . Smokeless tobacco: Never Used  Vaping Use  . Vaping Use: Never used  Substance Use Topics  . Alcohol use: Not Currently  . Drug use: Not Currently    Home Medications Prior to Admission medications   Not on File    Allergies    Patient has no known allergies.  Review of Systems   Review of Systems  All other systems reviewed and are negative.   Physical Exam Updated Vital Signs BP 107/84   Pulse (!) 129   Temp 98 F (36.7 C)   Resp 20   Ht 5\' 8"  (1.727 m)   Wt 52.2 kg   SpO2 96%   BMI 17.49 kg/m   Physical Exam Vitals and nursing note reviewed.   34 year old female, resting comfortably and in no acute distress. Vital signs are significant for rapid heart rate. Oxygen saturation is 96%, which is normal. Head is normocephalic and atraumatic. PERRLA, EOMI. Oropharynx is clear. Neck is nontender and supple  without adenopathy or JVD. Back is nontender and there is no CVA tenderness. Lungs have slightly diminished breath sounds on the left, coarse rhonchi present throughout. Chest is nontender. Heart is tachycardic without murmur. Abdomen is soft, flat, nontender without masses or hepatosplenomegaly and peristalsis is normoactive. Extremities have no cyanosis or edema, full range of motion is present. Skin is warm and dry without rash. Neurologic: Mental status is normal, cranial nerves are intact, there are no motor or sensory deficits.  ED Results / Procedures / Treatments   Labs (all labs ordered are listed, but only abnormal results are displayed) Labs Reviewed  RESP PANEL BY RT-PCR (FLU A&B, COVID) ARPGX2 - Abnormal; Notable for the following components:      Result Value   SARS Coronavirus 2 by RT PCR POSITIVE (*)    All other components within normal limits  CBC WITH DIFFERENTIAL/PLATELET - Abnormal; Notable for the following components:   Platelets 558 (*)    Abs Immature Granulocytes 0.19 (*)    All other components within normal limits  COMPREHENSIVE METABOLIC PANEL - Abnormal; Notable for the following components:   Sodium 125 (*)    Chloride 87 (*)    Glucose, Bld 115 (*)    Calcium 8.5 (*)  Albumin 2.4 (*)    AST 50 (*)    All other components within normal limits  D-DIMER, QUANTITATIVE (NOT AT [facility]) - Abnormal; Notable for the following components:   D-Dimer, Quant 1.69 (*)    All other components within normal limits   Radiology No results found.  Procedures Procedures   Medications Ordered in ED Medications  ketorolac (TORADOL) 30 MG/ML injection 30 mg (has no administration in time range)  lactated ringers bolus 1,000 mL (has no administration in time range)    ED Course  I have reviewed the triage vital signs and the nursing notes.  Pertinent labs & imaging results that were available during my care of the patient were reviewed by me and considered in  my medical decision making (see chart for details).  MDM Rules/Calculators/A&P Influenza-like illness.  In the setting of COVID-19 pandemic and with exposure to COVID-19, strongly suggestive of COVID-19 infection.  Respiratory pathogen panel is positive for COVID-19.  With her tachycardia, will give IV fluids and will check D-dimer.  Old records are reviewed, and she has no relevant past visits.  She has no comorbidities, so therefore is not a candidate for monoclonal antibody infusion.  Chest x-ray shows opacification of the left lung with tracheal shift to the right.  D-dimer is elevated so she is sent for CT angiogram which shows massive left pleural effusion.  This is felt to be unlikely to be due to Covid.  Labs show hyponatremia, minimal elevation of AST, thrombocytosis.  Plan will be to admit the patient and obtain both diagnostic and therapeutic thoracentesis later today.  Case is discussed with Dr. Thomes Dinning of [facility] hospital[facility], who agrees to admit the patient.  Alyssa Vargas was evaluated in Emergency Department on 04/02/2020 for the symptoms described in the history of present illness. She was evaluated in the context of the global COVID-19 pandemic, which necessitated consideration that the patient might be at risk for infection with the SARS-CoV-2 virus that causes COVID-19. Institutional protocols and algorithms that pertain to the evaluation of patients at risk for COVID-19 are in a state of rapid change based on information released by regulatory bodies including the CDC and federal and state organizations. These policies and algorithms were followed during the patient's care in the ED.  Final Clinical Impression(s) / ED Diagnoses Final diagnoses:  Pleural effusion, left  COVID-19 virus infection  Hyponatremia  Thrombocytosis  Elevated AST (SGOT)    Rx / DC Orders ED Discharge Orders    None       Dione Booze, MD 04/02/20 0745

## 2020-04-02 NOTE — H&P (Signed)
History and Physical  Alyssa Vargas JZP:[id number] DOB: [birth year] DOA: 04/01/2020  Referring physician: Dione Booze PCP: Patient, No Pcp Per  Patient coming from: Home  Chief Complaint: Shortness of breath  HPI: Alyssa Vargas is a 34 y.o. female with medical history significant for tobacco abuse who presents to the emergency department due to 1 week of worsening shortness of breath associated with cough, loss of sense of taste with decreased appetite and generalized body aches. Patient states that she was exposed to someone with Covid, she complained of chills and sweats, but was unsure if she had fever. She states that she has lost about 18 pounds within 1 month and states that this was due to being depressed prior to onset of current symptoms. Last smoking of cigarettes was yesterday. She denies any thoughts to harm herself or anyone.  ED Course: In the emergency department, she was tachycardic and intermittently tachypneic. Work-up in the ED showed thrombocytosis, hyponatremia, hypoalbuminemia, D-dimer 1.69, AST 50, ALT 41. SARS coronavirus 2 was positive. CT angiography chest with and without contrast showed no PE, but showed massive left pleural effusion, possibly representing a parapneumonic effusion or empyema, demonstrating marked mass-effect upon the mediastinal with marked left to right mediastinal shift. Chest x-ray showed complete opacification of the left hemithorax likely related to effusion and airspace disease. Patchy right midlung airspace disease also noted IV hydration was provided and patient was started on IV Decadron.  Review of Systems: Constitutional: Negative for chills and fever.  HENT: Negative for ear pain and sore throat.   Eyes: Negative for pain and visual disturbance.  Respiratory: Positive for cough and shortness of breath.   Cardiovascular: Negative for chest pain and palpitations.  Gastrointestinal: Negative for abdominal pain and vomiting.  Endocrine:  Negative for polyphagia and polyuria.  Genitourinary: Negative for decreased urine volume, dysuria, enuresis, hematuria Musculoskeletal: Negative for arthralgias and back pain.  Skin: Negative for color change and rash.  Allergic/Immunologic: Negative for immunocompromised state.  Neurological: Negative for tremors, syncope, speech difficulty, weakness, light-headedness and headaches.  Hematological: Does not bruise/bleed easily.  All other systems reviewed and are negative  No past medical history on file. No past surgical history on file.  Social History:  reports that she has been smoking cigarettes. She has been smoking about 0.50 packs per day. She has never used smokeless tobacco. She reports previous alcohol use. She reports previous drug use.   No Known Allergies  No family history on file.    Prior to Admission medications   Not on File    Physical Exam: BP 103/80   Pulse (!) 109   Temp 98.1 F (36.7 C) (Oral)   Resp (!) 25   Ht 5\' 8"  (1.727 m)   Wt 52.2 kg   SpO2 99%   BMI 17.49 kg/m   . General: 34 y.o. year-old female well developed well nourished in no acute distress.  Alert and oriented x3. 34 HEENT: NCAT, EOMI . Neck: Supple, trachea medial . Cardiovascular: Regular rate and rhythm with no rubs or gallops.  No thyromegaly or JVD noted.  No lower extremity edema. 2/4 pulses in all 4 extremities. Marland Kitchen Respiratory: Diminished breath sounds in left lobes. No wheezes  . Abdomen: Soft nontender nondistended with normal bowel sounds x4 quadrants. . Muskuloskeletal: No cyanosis, clubbing or edema noted bilaterally . Neuro: CN II-XII intact, strength, sensation, reflexes . Skin: No ulcerative lesions noted or rashes . Psychiatry: Judgement and insight appear normal. Mood is appropriate for condition and  setting          Labs on Admission:  Basic Metabolic Panel: Recent Labs  Lab 04/02/20 0158  NA 125*  K 3.9  CL 87*  CO2 28  GLUCOSE 115*  BUN 15   CREATININE 0.69  CALCIUM 8.5*  MG 1.8  PHOS 4.2   Liver Function Tests: Recent Labs  Lab 04/02/20 0158  AST 50*  ALT 41  ALKPHOS 68  BILITOT 0.7  PROT 7.9  ALBUMIN 2.4*   No results for input(s): LIPASE, AMYLASE in the last 168 hours. No results for input(s): AMMONIA in the last 168 hours. CBC: Recent Labs  Lab 04/02/20 0158  WBC 9.5  NEUTROABS 6.8  HGB 12.3  HCT 36.2  MCV 84.2  PLT 558*   Cardiac Enzymes: No results for input(s): CKTOTAL, CKMB, CKMBINDEX, TROPONINI in the last 168 hours.  BNP (last 3 results) No results for input(s): BNP in the last 8760 hours.  ProBNP (last 3 results) No results for input(s): PROBNP in the last 8760 hours.  CBG: No results for input(s): GLUCAP in the last 168 hours.  Radiological Exams on Admission: CT Angio Chest PE W and/or Wo Contrast  Result Date: 04/02/2020 CLINICAL DATA:  COVID exposure, dyspnea, cough, malaise, positive D-dimer EXAM: CT ANGIOGRAPHY CHEST WITH CONTRAST TECHNIQUE: Multidetector CT imaging of the chest was performed using the standard protocol during bolus administration of intravenous contrast. Multiplanar CT image reconstructions and MIPs were obtained to evaluate the vascular anatomy. CONTRAST:  OMNIPAQUE IOHEXOL 350 MG/ML SOLN COMPARISON:  None. FINDINGS: Cardiovascular: There is adequate opacification of the pulmonary arterial tree. There is no intraluminal filling defect identified to suggest acute pulmonary embolism. The central pulmonary arteries are of normal caliber. There is marked mediastinal shift to the right. Global cardiac size within normal limits. No significant coronary artery calcification. No pericardial effusion. The thoracic aorta is unremarkable. Mediastinum/Nodes: Thyroid unremarkable. Soft tissue within the anterior mediastinum likely represents rebound thymic or residual thymic tissue. The esophagus is unremarkable. No pathologic thoracic adenopathy is identified. Lungs/Pleura:  There is a massive left pleural effusion which completely fills the left hemithorax, completely collapses the left lung, and demonstrates marked mass effect upon the mediastinum with marked mediastinal shift to the right. There is interstitial gas within a portion of the left lower lobe likely representing the lateral segment of the left lower lobe which may represent necrosis of the setting of necrotizing pneumonia. Mild patchy ground-glass infiltrate within the a right upper lobe anteriorly is nonspecific, possibly infectious or inflammatory. A bilobed fluid density structure is seen within the right middle lobe measuring 2.3 x 3.6 x 1.6 cm in greatest dimension, nonspecific, but possibly the sequela of prior infection or trauma. Upper Abdomen: No acute abnormality. Musculoskeletal: No acute bone abnormality. Review of the MIP images confirms the above findings. IMPRESSION: No pulmonary embolism. Massive left pleural effusion, possibly representing a a parapneumonic effusion or empyema, demonstrating marked mass effect upon the mediastinum with marked left right mediastinal shift. Extensive interstitial gas within the probable lateral segment of the right lower lobe suggesting parenchymal necrosis in the setting of necrotizing pneumonia. Bilobed fluid-filled structure within the right middle lobe measuring 3.6 cm possibly representing the sequela of remote trauma or inflammation. Minimal patchy infiltrate within the right upper lobe, likely infectious or inflammatory. Electronically Signed   By: Helyn Numbers MD   On: 04/02/2020 04:18   DG Chest Port 1 View  Result Date: 04/02/2020 CLINICAL DATA:  COVID, shortness of  breath EXAM: PORTABLE CHEST 1 VIEW COMPARISON:  None. FINDINGS: Complete whiteout of the left hemithorax, likely related to a combination of effusion and airspace disease. Heart and mediastinal structures are shifted to the right. Right mid lung patchy airspace disease. No effusion on the right.  No acute bony abnormality. IMPRESSION: IMPRESSION Complete opacification of the left hemithorax, likely related to effusion and airspace disease. Patchy right mid lung airspace disease. Electronically Signed   By: Charlett Nose M.D.   On: 04/02/2020 02:48    EKG: I independently viewed the EKG done and my findings are as followed: EKG was not done in the ED  Assessment/Plan Present on Admission: . COVID-19 virus infection  Principal Problem:   COVID-19 virus infection Active Problems:   Pleural effusion on left   Tobacco abuse   Hyponatremia   Hypoalbuminemia   Thrombocytosis   Elevated d-dimer  Left pleural effusion in the setting of acute respiratory failure with hypoxia due to COVID-19 virus infection Chest x-ray showed showed complete opacification of the left hemithorax CT angiography of chest showed massive left pleural effusion Patient was requiring supplemental oxygen via [place] at 2 LPM IR will be consulted for left-sided paracentesis with subsequent pleural fluid analysis Continue albuterol q.6h Continue IV Solu-Medrol per pharmacy dosing  Continue IV Remdesivir per pharmacy protocol Continue Mucinex, Robitussin and Tussionex Continue Tylenol p.r.n. for fever Continue supplemental oxygen to maintain O2 sat > or = 94% with plan to wean patient off supplemental oxygen as tolerated (of note, patient does not use oxygen at baseline) Continue incentive spirometry and flutter valve q6min as tolerated Encourage proning, early ambulation, and side laying as tolerated Continue airborne isolation precaution Inflammatory markers: D-dimer: 1.69 Continue monitoring daily inflammatory markers Physician PPE:  Surgical mask with face shield, N-95, nonsterile gloves, disposable gown, head and shoe cover s Patient PPE:  Face mask   Hyponatremia Na 125 Continue gentle hydration Continue to monitor sodium with serial BMPs Urine osmolality and urine sodium will be  checked  Hypoalbuminemia possibly secondary to moderate protein calorie malnutrition Albumin 2.4; protein supplement will be provided  Thrombocytosis possibly reactive Platelets 558, Continue to monitor platelet level   Elevated D-dimer Possibly due to COVID-19 virus infection No PE per CT angiography of chest  Tobacco abuse Patient was counseled on tobacco abuse cessation  DVT prophylaxis: SCDs  Code Status: Full code  Family Communication: None at bedside  Disposition Plan:  Patient is from:                        home Anticipated DC to:                   home Anticipated DC date:               2-3 days Anticipated DC barriers:          Patient is unstable to be discharged at this time due to hypoxia secondary to COVID-19 virus infection and large pleural effusion requiring thoracentesis and pleural fluid analysis    Consults called: IR  Admission status: Inpatient    Frankey Shown MD [facility]  04/02/2020, 6:25 AM

## 2020-04-03 ENCOUNTER — Inpatient Hospital Stay (HOSPITAL_COMMUNITY): Payer: Medicaid Other

## 2020-04-03 ENCOUNTER — Encounter (HOSPITAL_COMMUNITY): Payer: Self-pay | Admitting: Family Medicine

## 2020-04-03 DIAGNOSIS — R7401 Elevation of levels of liver transaminase levels: Secondary | ICD-10-CM

## 2020-04-03 DIAGNOSIS — I38 Endocarditis, valve unspecified: Secondary | ICD-10-CM

## 2020-04-03 LAB — CBC WITH DIFFERENTIAL/PLATELET
Abs Immature Granulocytes: 0.35 10*3/uL — ABNORMAL HIGH (ref 0.00–0.07)
Basophils Absolute: 0 10*3/uL (ref 0.0–0.1)
Basophils Relative: 1 %
Eosinophils Absolute: 0 10*3/uL (ref 0.0–0.5)
Eosinophils Relative: 0 %
HCT: 33.4 % — ABNORMAL LOW (ref 36.0–46.0)
Hemoglobin: 11.2 g/dL — ABNORMAL LOW (ref 12.0–15.0)
Immature Granulocytes: 4 %
Lymphocytes Relative: 30 %
Lymphs Abs: 2.5 10*3/uL (ref 0.7–4.0)
MCH: 27.7 pg (ref 26.0–34.0)
MCHC: 33.5 g/dL (ref 30.0–36.0)
MCV: 82.5 fL (ref 80.0–100.0)
Monocytes Absolute: 0.9 10*3/uL (ref 0.1–1.0)
Monocytes Relative: 11 %
Neutro Abs: 4.7 10*3/uL (ref 1.7–7.7)
Neutrophils Relative %: 54 %
Platelets: 584 10*3/uL — ABNORMAL HIGH (ref 150–400)
RBC: 4.05 MIL/uL (ref 3.87–5.11)
RDW: 13.5 % (ref 11.5–15.5)
WBC: 8.5 10*3/uL (ref 4.0–10.5)
nRBC: 0 % (ref 0.0–0.2)

## 2020-04-03 LAB — FERRITIN: Ferritin: 249 ng/mL (ref 11–307)

## 2020-04-03 LAB — COMPREHENSIVE METABOLIC PANEL
ALT: 38 U/L (ref 0–44)
AST: 43 U/L — ABNORMAL HIGH (ref 15–41)
Albumin: 1.7 g/dL — ABNORMAL LOW (ref 3.5–5.0)
Alkaline Phosphatase: 56 U/L (ref 38–126)
Anion gap: 11 (ref 5–15)
BUN: 23 mg/dL — ABNORMAL HIGH (ref 6–20)
CO2: 26 mmol/L (ref 22–32)
Calcium: 8 mg/dL — ABNORMAL LOW (ref 8.9–10.3)
Chloride: 96 mmol/L — ABNORMAL LOW (ref 98–111)
Creatinine, Ser: 0.69 mg/dL (ref 0.44–1.00)
GFR, Estimated: >60 mL/min (ref >60)
Glucose, Bld: 107 mg/dL — ABNORMAL HIGH (ref 70–99)
Potassium: 3.6 mmol/L (ref 3.5–5.1)
Sodium: 133 mmol/L — ABNORMAL LOW (ref 135–145)
Total Bilirubin: 0.5 mg/dL (ref 0.3–1.2)
Total Protein: 5.7 g/dL — ABNORMAL LOW (ref 6.5–8.1)

## 2020-04-03 LAB — ECHOCARDIOGRAM LIMITED
Area-P 1/2: 5.23 cm2
Height: 67 in
S' Lateral: 2.3 cm
Weight: 1770.73 oz

## 2020-04-03 LAB — PROTIME-INR
INR: 1.1 (ref 0.8–1.2)
Prothrombin Time: 14.2 seconds (ref 11.4–15.2)

## 2020-04-03 LAB — C-REACTIVE PROTEIN: CRP: 6.1 mg/dL — ABNORMAL HIGH (ref <1.0)

## 2020-04-03 LAB — APTT: aPTT: 33 seconds (ref 24–36)

## 2020-04-03 LAB — MAGNESIUM: Magnesium: 1.9 mg/dL (ref 1.7–2.4)

## 2020-04-03 LAB — GLUCOSE, CAPILLARY: Glucose-Capillary: 115 mg/dL — ABNORMAL HIGH (ref 70–99)

## 2020-04-03 LAB — D-DIMER, QUANTITATIVE: D-Dimer, Quant: 1.52 ug/mL-FEU — ABNORMAL HIGH (ref 0.00–0.50)

## 2020-04-03 LAB — MRSA PCR SCREENING: MRSA by PCR: NEGATIVE

## 2020-04-03 LAB — AMYLASE, PLEURAL OR PERITONEAL FLUID: Amylase, Fluid: 26 U/L

## 2020-04-03 LAB — PHOSPHORUS: Phosphorus: 4.5 mg/dL (ref 2.5–4.6)

## 2020-04-03 MED ORDER — MIDAZOLAM HCL 2 MG/2ML IJ SOLN
INTRAMUSCULAR | Status: AC | PRN
  Administered 2020-04-03 (×2): 1 mg via INTRAVENOUS

## 2020-04-03 MED ORDER — LIDOCAINE HCL 1 % IJ SOLN
INTRAMUSCULAR | Status: AC
  Filled 2020-04-03: qty 20

## 2020-04-03 MED ORDER — FENTANYL CITRATE (PF) 100 MCG/2ML IJ SOLN
INTRAMUSCULAR | Status: AC
  Filled 2020-04-03: qty 4

## 2020-04-03 MED ORDER — HYDROMORPHONE HCL 1 MG/ML IJ SOLN
1.0000 mg | INTRAMUSCULAR | Status: DC | PRN
  Administered 2020-04-03 – 2020-04-05 (×11): 1 mg via INTRAVENOUS
  Filled 2020-04-03 (×11): qty 1

## 2020-04-03 MED ORDER — VANCOMYCIN HCL 750 MG/150ML IV SOLN
750.0000 mg | Freq: Two times a day (BID) | INTRAVENOUS | Status: DC
  Administered 2020-04-03 – 2020-04-05 (×4): 750 mg via INTRAVENOUS
  Filled 2020-04-03 (×4): qty 150

## 2020-04-03 MED ORDER — OXYCODONE-ACETAMINOPHEN 5-325 MG PO TABS
1.0000 | ORAL_TABLET | ORAL | Status: DC | PRN
  Administered 2020-04-03 – 2020-04-04 (×4): 2 via ORAL
  Administered 2020-04-05: 1 via ORAL
  Filled 2020-04-03 (×6): qty 2

## 2020-04-03 MED ORDER — FENTANYL CITRATE (PF) 100 MCG/2ML IJ SOLN
INTRAMUSCULAR | Status: AC | PRN
  Administered 2020-04-03 (×2): 50 ug via INTRAVENOUS

## 2020-04-03 MED ORDER — MIDAZOLAM HCL 2 MG/2ML IJ SOLN
INTRAMUSCULAR | Status: AC
  Filled 2020-04-03: qty 4

## 2020-04-03 MED ORDER — HYDROXYZINE HCL 25 MG PO TABS
25.0000 mg | ORAL_TABLET | Freq: Three times a day (TID) | ORAL | Status: DC | PRN
  Administered 2020-04-06 – 2020-04-22 (×4): 25 mg via ORAL
  Filled 2020-04-03 (×5): qty 1

## 2020-04-03 NOTE — Progress Notes (Signed)
[facility]                                                                              Patient Demographics  Alyssa Vargas, is a 34 y.o. female, DOB - 31-Jul-1985, ZOX:[id number]  Admit date - 04/01/2020   Admitting Physician Courage Mariea Clonts, MD  Outpatient Primary MD for the patient is Patient, No Pcp Per  Outpatient specialists:   LOS - 1  days   Medical records reviewed and are as summarized below:    Chief Complaint  Patient presents with  . Covid Exposure       Brief summary   34 yo WF with H/o IVDU (iv Heroin and iv Methamphetamine) and Tobacco abuse admitted on 04/02/2020 to [facility] with dyspnea, productive cough, loss of sense of taste and smell, anorexia, generalized aches, pains, malaise fatigue and myalgias and found to be positive for COVID-19 infection with chest x-ray consistent with very very large left-sided pleural effusion with mediastinal shift,-left-sided thoracentesis on 04/02/2020 you did purulent fluid consistent with empyema with Gram stain showing GPC -Pulmonology and CT surgery consult appreciated Patient was transferred to [facility] for chest tube placement under CT guidance.  Assessment & Plan    Principal Problem:   Massive left-sided empyema lung with mediastinal shift -Large left-sided empyema, CT surgery, Dr. Tyrone Sage was consulted by Dr Mariea Clonts, recommended IR to place large bore chest tube to drain the purulent/empyema fluid, no plans for VATS -IR consulted, scheduled for CT-guided chest tube placement today -Continue IV Zosyn and vancomycin, pulmonology following -Follow pleural fluid studies and cultures, blood cultures -Status post left-sided thoracentesis on 12/17, 1.88 L purulent appearing fluid removed, Gram stain with GPC, sensitivities still pending  Active Problems: Acute hypoxic respiratory failure  -Multifactorial due to acute COVID-19 viral pneumonia and due to large left-sided empyema - Continue IV solumedrol,   Remdesivir per pharmacy protocol,  - Continue Supportive care: albuterol, Tylenol, I-S, flutter valve, proning advised. - Continue to wean oxygen, ambulatory O2 screening daily as tolerated  - Oxygen - SpO2: 99 % O2 Flow Rate (L/min): 2 L/min - Continue to follow labs as below  Lab Results  Component Value Date   SARSCOV2NAA POSITIVE (A) 04/01/2020     Recent Labs  Lab 04/02/20 0158  DDIMER 1.69*  ALT 41    History of IV drug use (IV heroin, methamphetamine) -Patient reported she last used IV methamphetamine and heroin on Sunday, 03/28/20 -Follow closely for any withdrawals  Depression, anorexia -Denies suicidal or homicidal ideation or plan -Continue BuSpar   Hyponatremia -Sodium 125 on 12/17, awaiting labs, patient was hydrated with IV fluids  Tobacco use -Nicotine patch placed    Code Status: Full CODE STATUS DVT Prophylaxis: Held for procedure today Family Communication: Discussed all imaging results, lab results, explained to the patient  Disposition Plan:     Status is: Inpatient  Remains inpatient appropriate because:Inpatient level of care appropriate due to severity of illness   Dispo: The patient is from: Home              Anticipated d/c is to: Home  Anticipated d/c date is: > 3 days              Patient currently is not medically stable to d/c.  Will need chest tube placement, on active Covid treatment      Time Spent in minutes   35 minutes Procedures:  None  Consultants:   CT surgery Intervention radiology  Antimicrobials:   Anti-infectives (From admission, onward)   Start     Dose/Rate Route Frequency Ordered Stop   04/03/20 1000  remdesivir 100 mg in sodium chloride 0.9 % 100 mL IVPB  Status:  Discontinued       "Followed by" Linked Group Details   100 mg 200 mL/hr over 30 Minutes Intravenous Daily 04/02/20 0654 04/02/20 0656   04/03/20 1000  remdesivir 100 mg in sodium chloride 0.9 % 100 mL IVPB       "Followed by"  Linked Group Details   100 mg 200 mL/hr over 30 Minutes Intravenous Daily 04/02/20 0658 04/07/20 0959   04/03/20 0400  vancomycin (VANCOREADY) IVPB 500 mg/100 mL        500 mg 100 mL/hr over 60 Minutes Intravenous Every 12 hours 04/02/20 1459     04/02/20 2200  piperacillin-tazobactam (ZOSYN) IVPB 3.375 g        3.375 g 12.5 mL/hr over 240 Minutes Intravenous Every 8 hours 04/02/20 1332     04/02/20 1515  vancomycin (VANCOREADY) IVPB 1250 mg/250 mL  Status:  Discontinued        1,250 mg 166.7 mL/hr over 90 Minutes Intravenous Every 24 hours 04/02/20 1449 04/02/20 1449   04/02/20 1515  vancomycin (VANCOREADY) IVPB 1250 mg/250 mL        1,250 mg 166.7 mL/hr over 90 Minutes Intravenous  Once 04/02/20 1449 04/02/20 1701   04/02/20 1345  piperacillin-tazobactam (ZOSYN) IVPB 3.375 g        3.375 g 100 mL/hr over 30 Minutes Intravenous  Once 04/02/20 1332 04/02/20 1434   04/02/20 0900  remdesivir 100 mg in sodium chloride 0.9 % 100 mL IVPB       "Followed by" Linked Group Details   100 mg 200 mL/hr over 30 Minutes Intravenous  Once 04/02/20 0658 04/02/20 1251   04/02/20 0800  remdesivir 100 mg in sodium chloride 0.9 % 100 mL IVPB       "Followed by" Linked Group Details   100 mg 200 mL/hr over 30 Minutes Intravenous  Once 04/02/20 0658 04/02/20 0853   04/02/20 0700  remdesivir 200 mg in sodium chloride 0.9% 250 mL IVPB  Status:  Discontinued       "Followed by" Linked Group Details   200 mg 580 mL/hr over 30 Minutes Intravenous Once 04/02/20 0654 04/02/20 0656          Medications  Scheduled Meds: . albuterol  2 puff Inhalation Q6H  . busPIRone  10 mg Oral TID  . dextromethorphan-guaiFENesin  1 tablet Oral BID  . feeding supplement  237 mL Oral BID BM  . insulin aspart  0-5 Units Subcutaneous QHS  . insulin aspart  0-9 Units Subcutaneous TID WC  . methylPREDNISolone (SOLU-MEDROL) injection  0.5 mg/kg Intravenous Q12H   Followed by  . [START ON 04/05/2020] predniSONE  50 mg  Oral Daily  . nicotine  14 mg Transdermal Daily  . pantoprazole  40 mg Oral Daily  . traZODone  100 mg Oral QHS   Continuous Infusions: . piperacillin-tazobactam (ZOSYN)  IV 3.375 g (04/03/20 0609)  . remdesivir 100 mg  in NS 100 mL 100 mg (04/03/20 1040)  . vancomycin 500 mg (04/03/20 0436)   PRN Meds:.acetaminophen, albuterol, ALPRAZolam, chlorpheniramine-HYDROcodone, guaiFENesin-dextromethorphan, hydrOXYzine      Subjective:   Rosalena Mccorry was seen and examined today.  No acute complaints except pleuritic left chest pain 4-5/10.  No fevers or chills.  Patient denies dizziness, abdominal pain, new weakness, numbess, tingling. No acute events overnight.    Objective:   Vitals:   04/02/20 2000 04/02/20 2220 04/03/20 0449 04/03/20 0812  BP: 107/79 112/80  110/78  Pulse:  (!) 102 100 96  Resp:  20 (!) 22 19  Temp:  98.1 F (36.7 C) 98.4 F (36.9 C) 97.8 F (36.6 C)  TempSrc:  Oral Oral Oral  SpO2:  98%  99%  Weight:  50.2 kg    Height:  5\' 7"  (1.702 m)      Intake/Output Summary (Last 24 hours) at 04/03/2020 1040 Last data filed at 04/02/2020 1701 Gross per 24 hour  Intake 250 ml  Output --  Net 250 ml     Wt Readings from Last 3 Encounters:  04/02/20 50.2 kg  05/27/19 58.1 kg  05/26/19 58.1 kg     Exam  General: Alert and oriented x 3, NAD  Cardiovascular: S1 S2 auscultated, no murmurs, RRR  Respiratory: Diminished breath sounds left side, no wheezing  Gastrointestinal: Soft, nontender, nondistended, + bowel sounds  Ext: no pedal edema bilaterally  Neuro no new FND's  Musculoskeletal: No digital cyanosis, clubbing  Skin: No rashes  Psych: Normal affect and demeanor, alert and oriented x3    Data Reviewed:  I have personally reviewed following labs and imaging studies  Micro Results Recent Results (from the past 240 hour(s))  Resp Panel by RT-PCR (Flu A&B, Covid) Nasopharyngeal Swab     Status: Abnormal   Collection Time: 04/01/20 10:42 PM    Specimen: Nasopharyngeal Swab; Nasopharyngeal(NP) swabs in vial transport medium  Result Value Ref Range Status   SARS Coronavirus 2 by RT PCR POSITIVE (A) NEGATIVE Final    Comment: RESULT CALLED TO, READ BACK BY AND VERIFIED WITH: T WALKER,RN@2337  04/01/20 MKELLY (NOTE) SARS-CoV-2 target nucleic acids are DETECTED.  The SARS-CoV-2 RNA is generally detectable in upper respiratory specimens during the acute phase of infection. Positive results are indicative of the presence of the identified virus, but do not rule out bacterial infection or co-infection with other pathogens not detected by the test. Clinical correlation with patient history and other diagnostic information is necessary to determine patient infection status. The expected result is Negative.  Fact Sheet for Patients: 04/03/20  Fact Sheet for Healthcare Providers: BloggerCourse.com  This test is not yet approved or cleared by the [place] FDA and  has been authorized for detection and/or diagnosis of SARS-CoV-2 by FDA under an Emergency Use Authorization (EUA).  This EUA will remain in effect (meaning this test can be  used) for the duration of  the COVID-19 declaration under Section 564(b)(1) of the Act, 21 U.S.C. section 360bbb-3(b)(1), unless the authorization is terminated or revoked sooner.     Influenza A by PCR NEGATIVE NEGATIVE Final   Influenza B by PCR NEGATIVE NEGATIVE Final    Comment: (NOTE) The Xpert Xpress SARS-CoV-2/FLU/RSV plus assay is intended as an aid in the diagnosis of influenza from Nasopharyngeal swab specimens and should not be used as a sole basis for treatment. Nasal washings and aspirates are unacceptable for Xpert Xpress SARS-CoV-2/FLU/RSV testing.  Fact Sheet for Patients: [place]  Fact  Sheet for Healthcare Providers: SeriousBroker.ithttps://www.fda.gov/media/152162/download  This test is not  yet approved or cleared by the [place] FDA and has been authorized for detection and/or diagnosis of SARS-CoV-2 by FDA under an Emergency Use Authorization (EUA). This EUA will remain in effect (meaning this test can be used) for the duration of the COVID-19 declaration under Section 564(b)(1) of the Act, 21 U.S.C. section 360bbb-3(b)(1), unless the authorization is terminated or revoked.  Performed at [facility], [address] [place], [place] [address]   Gram stain     Status: None   Collection Time: 04/02/20 11:40 AM   Specimen: Pleura; Body Fluid  Result Value Ref Range Status   Specimen Description PLEURAL  Final   Special Requests NONE  Final   Gram Stain   Final    GRAM POSITIVE COCCI Gram Stain Report Called to,Read Back By and Verified With: EASTER,T@1417  by MATTHEWS, B 12.17.21 WBC PRESENT,BOTH PMN AND MONONUCLEAR Performed at [facility], [address] [place], [place] [address]    Report Status 04/02/2020 FINAL  Final  Culture, body fluid-bottle     Status: None (Preliminary result)   Collection Time: 04/02/20 11:40 AM   Specimen: Pleura  Result Value Ref Range Status   Specimen Description PLEURAL  Final   Special Requests NONE  Final   Gram Stain   Final    GRAM POSITIVE COCCI AEROBIC BOTTLE Gram Stain Report Called to,Read Back By and Verified With: EASTER,T ON 04/02/20 AT 2040 BY LOY,C Performed at [facility] ANAEROBIC BOTTLE ALSO    Culture   Final    NO GROWTH < 24 HOURS Performed at [facility], [address] [place], [place] [address]    Report Status PENDING  Incomplete  Culture, blood (Routine X 2) w Reflex to ID Panel     Status: None (Preliminary result)   Collection Time: 04/02/20  2:48 PM   Specimen: BLOOD  Result Value Ref Range Status   Specimen Description BLOOD LEFT ANTECUBITAL  Final   Special Requests   Final    BOTTLES DRAWN AEROBIC AND ANAEROBIC Blood Culture adequate volume   Culture   Final    NO GROWTH < 12  HOURS Performed at [facility], [address] [place], [place] [address]    Report Status PENDING  Incomplete  MRSA PCR Screening     Status: None   Collection Time: 04/03/20  7:11 AM   Specimen: Nasal Mucosa; Nasopharyngeal  Result Value Ref Range Status   MRSA by PCR NEGATIVE NEGATIVE Final    Comment:        The GeneXpert MRSA Assay (FDA approved for NASAL specimens only), is one component of a comprehensive MRSA colonization surveillance program. It is not intended to diagnose MRSA infection nor to guide or monitor treatment for MRSA infections. Performed at [facility], [address] [place], [place] [address]     Radiology Reports CT Angio Chest PE W and/or Wo Contrast  Result Date: 04/02/2020 CLINICAL DATA:  COVID exposure, dyspnea, cough, malaise, positive D-dimer EXAM: CT ANGIOGRAPHY CHEST WITH CONTRAST TECHNIQUE: Multidetector CT imaging of the chest was performed using the standard protocol during bolus administration of intravenous contrast. Multiplanar CT image reconstructions and MIPs were obtained to evaluate the vascular anatomy. CONTRAST:  100mL OMNIPAQUE IOHEXOL 350 MG/ML SOLN COMPARISON:  None. FINDINGS: Cardiovascular: There is adequate opacification of the pulmonary arterial tree. There is no intraluminal filling defect identified to suggest acute pulmonary embolism. The central pulmonary  arteries are of normal caliber. There is marked mediastinal shift to the right. Global cardiac size within normal limits. No significant coronary artery calcification. No pericardial effusion. The thoracic aorta is unremarkable. Mediastinum/Nodes: Thyroid unremarkable. Soft tissue within the anterior mediastinum likely represents rebound thymic or residual thymic tissue. The esophagus is unremarkable. No pathologic thoracic adenopathy is identified. Lungs/Pleura: There is a massive left pleural effusion which completely fills the left hemithorax, completely collapses the  left lung, and demonstrates marked mass effect upon the mediastinum with marked mediastinal shift to the right. There is interstitial gas within a portion of the left lower lobe likely representing the lateral segment of the left lower lobe which may represent necrosis of the setting of necrotizing pneumonia. Mild patchy ground-glass infiltrate within the a right upper lobe anteriorly is nonspecific, possibly infectious or inflammatory. A bilobed fluid density structure is seen within the right middle lobe measuring 2.3 x 3.6 x 1.6 cm in greatest dimension, nonspecific, but possibly the sequela of prior infection or trauma. Upper Abdomen: No acute abnormality. Musculoskeletal: No acute bone abnormality. Review of the MIP images confirms the above findings. IMPRESSION: No pulmonary embolism. Massive left pleural effusion, possibly representing a a parapneumonic effusion or empyema, demonstrating marked mass effect upon the mediastinum with marked left right mediastinal shift. Extensive interstitial gas within the probable lateral segment of the right lower lobe suggesting parenchymal necrosis in the setting of necrotizing pneumonia. Bilobed fluid-filled structure within the right middle lobe measuring 3.6 cm possibly representing the sequela of remote trauma or inflammation. Minimal patchy infiltrate within the right upper lobe, likely infectious or inflammatory. Electronically Signed   By: Helyn Numbers MD   On: 04/02/2020 04:18   DG Chest Portable 1 View  Result Date: 04/02/2020 CLINICAL DATA:  Large LEFT pleural effusion EXAM: PORTABLE CHEST 1 VIEW COMPARISON:  Portable exam at 1203 compared to 0227 hrs FINDINGS: Resolution of previously identified LEFT to RIGHT mediastinal shift. Unable to assess heart size due to subtotal opacification of the LEFT hemithorax. Minimal aeration now seen in LEFT lung. No pneumothorax. Nodular foci at inferior RIGHT lung again seen with minimal RIGHT basilar atelectasis.  Osseous structures unremarkable. IMPRESSION: No pneumothorax following LEFT thoracentesis. Resolution of LEFT to RIGHT mediastinal shift. Persistent nodular foci at inferior RIGHT hemithorax. Electronically Signed   By: Ulyses Southward M.D.   On: 04/02/2020 12:32   DG Chest Port 1 View  Result Date: 04/02/2020 CLINICAL DATA:  COVID, shortness of breath EXAM: PORTABLE CHEST 1 VIEW COMPARISON:  None. FINDINGS: Complete whiteout of the left hemithorax, likely related to a combination of effusion and airspace disease. Heart and mediastinal structures are shifted to the right. Right mid lung patchy airspace disease. No effusion on the right. No acute bony abnormality. IMPRESSION: IMPRESSION Complete opacification of the left hemithorax, likely related to effusion and airspace disease. Patchy right mid lung airspace disease. Electronically Signed   By: Charlett Nose M.D.   On: 04/02/2020 02:48   US THORACENTESIS ASP PLEURAL SPACE W/IMG GUIDE  Result Date: 04/02/2020 INDICATION: Large LEFT pleural effusion EXAM: ULTRASOUND GUIDED DIAGNSOTIC AND THERAPEUTIC LEFT THORACENTESIS MEDICATIONS: None. COMPLICATIONS: None immediate. PROCEDURE: Procedure, benefits, and risks of procedure were discussed with patient. Written informed consent for procedure was obtained. Time out protocol followed. Pleural effusion localized by ultrasound at the posterior LEFT hemithorax. Fluid is markedly complex containing diffuse internal echogenicity as well as dependent echogenicity. Skin prepped and draped in usual sterile fashion. Skin and soft tissues anesthetized with 10  mL of 1% lidocaine. 8 French thoracentesis catheter placed into the LEFT pleural space. 1.88 L of complex cloudy tan fluid with particulates with purulent appearance aspirated by syringe pump. Findings most likely represent empyema. Procedure tolerated well by patient without immediate complication. FINDINGS: Complex purulent appearing LEFT pleural fluid likely  representing empyema. IMPRESSION: Successful ultrasound guided LEFT thoracentesis yielding 1.88 L of pleural fluid. Electronically Signed   By: Ulyses Southward M.D.   On: 04/02/2020 12:39    Lab Data:  CBC: Recent Labs  Lab 04/02/20 0158 04/03/20 0008  WBC 9.5 8.5  NEUTROABS 6.8 4.7  HGB 12.3 11.2*  HCT 36.2 33.4*  MCV 84.2 82.5  PLT 558* 584*   Basic Metabolic Panel: Recent Labs  Lab 04/02/20 0158 04/02/20 0728  NA 125* 125*  K 3.9 4.3  CL 87* 87*  CO2 28 29  GLUCOSE 115* 110*  BUN 15 16  CREATININE 0.69 0.70  CALCIUM 8.5* 8.4*  MG 1.8  --   PHOS 4.2  --    GFR: Estimated Creatinine Clearance: 80 mL/min (by C-G formula based on SCr of 0.7 mg/dL). Liver Function Tests: Recent Labs  Lab 04/02/20 0158  AST 50*  ALT 41  ALKPHOS 68  BILITOT 0.7  PROT 7.9  ALBUMIN 2.4*   No results for input(s): LIPASE, AMYLASE in the last 168 hours. No results for input(s): AMMONIA in the last 168 hours. Coagulation Profile: No results for input(s): INR, PROTIME in the last 168 hours. Cardiac Enzymes: No results for input(s): CKTOTAL, CKMB, CKMBINDEX, TROPONINI in the last 168 hours. BNP (last 3 results) No results for input(s): PROBNP in the last 8760 hours. HbA1C: No results for input(s): HGBA1C in the last 72 hours. CBG: Recent Labs  Lab 04/02/20 1016 04/02/20 1120 04/02/20 1623 04/02/20 2323  GLUCAP 101* 116* 117* 103*   Lipid Profile: No results for input(s): CHOL, HDL, LDLCALC, TRIG, CHOLHDL, LDLDIRECT in the last 72 hours. Thyroid Function Tests: No results for input(s): TSH, T4TOTAL, FREET4, T3FREE, THYROIDAB in the last 72 hours. Anemia Panel: No results for input(s): VITAMINB12, FOLATE, FERRITIN, TIBC, IRON, RETICCTPCT in the last 72 hours. Urine analysis: No results found for: COLORURINE, APPEARANCEUR, LABSPEC, PHURINE, GLUCOSEU, HGBUR, BILIRUBINUR, KETONESUR, PROTEINUR, UROBILINOGEN, NITRITE, LEUKOCYTESUR   Mikalah Skyles M.D. [facility] 04/03/2020, 10:40 AM   Call night coverage person covering after 7pm

## 2020-04-03 NOTE — Progress Notes (Addendum)
Pharmacy Antibiotic Note  Alyssa Vargas is a 34 y.o. female admitted on 04/01/2020 with empyema.  Pharmacy has been consulted for Vancomycin and Zosyn dosing.  SCr is stable. WBC wnl. Large empyema - to go for IR drain placement today. Pleural fluid with GPC - speciation and susceptibilities pending. Blood cultures are no growth x1 day.   Plan: Continue Zosyn 3.375g IV every 8 hours - extended infusion.  Increase Vancomycin to 750 mg IV every 12 hours based on CrCl and age and site of infection.  Monitor renal function, culture results for ability to narrow, and clinical status.   Height: 5\' 7"  (170.2 cm) Weight: 50.2 kg (110 lb 10.7 oz) IBW/kg (Calculated) : 61.6  Temp (24hrs), Avg:98.2 F (36.8 C), Min:97.8 F (36.6 C), Max:98.4 F (36.9 C)  Recent Labs  Lab 04/02/20 0158 04/02/20 0728 04/03/20 0008  WBC 9.5  --  8.5  CREATININE 0.69 0.70  --     Estimated Creatinine Clearance: 80 mL/min (by C-G formula based on SCr of 0.7 mg/dL).    No Known Allergies  Antimicrobials this admission: Vanc 12/18 >> Zosyn 12/18 >>  Dose adjustments this admission:  Microbiology results: 12/17 Pleural fluid culture >> GPC  Thank you for allowing pharmacy to be a part of this patient's care.  1/18, PharmD, BCPS, BCCCP Clinical Pharmacist Please refer to [facility] for [facility] Pharmacy numbers 04/03/2020 2:55 PM

## 2020-04-03 NOTE — Procedures (Signed)
Interventional Radiology Procedure Note  Procedure: CT Guided Drainage of left empyema  Complications: None  Estimated Blood Loss: < 10 mL  Findings: 14 Fr drain placed in left pleural empyema with return of puruent fluid. Drain attached to [employer].  Will follow.  Jodi Marble. Fredia Sorrow, M.D Pager:  [phone]

## 2020-04-03 NOTE — Progress Notes (Signed)
  Echocardiogram 2D Echocardiogram has been performed.  Delcie Roch 04/03/2020, 2:19 PM

## 2020-04-03 NOTE — Progress Notes (Signed)
Chief Complaint: Patient was seen in consultation today for left chest drain  Referring Physician(s): Dr. Isidoro Donningai  Supervising Physician: Irish LackYamagata, Glenn  Patient Status: [facility] - [facility]pt  History of Present Illness: Alyssa Vargas is a 34 y.o. female with hx of IVDU [facility] with large left pleural effusion and COVID+. Thoracentesis was performed there with concerns for empyema. [facility] level [facility] care. IR is asked to place chest tube for evacuation. PMHx, meds, labs, imaging, allergies reviewed. Has been NPO today as directed.   History reviewed. No pertinent past medical history.  History reviewed. No pertinent surgical history.  Allergies: Patient has no known allergies.  Medications:  Current Facility-Administered Medications:  .  fentaNYL (SUBLIMAZE) 100 MCG/2ML injection, , , ,  .  midazolam (VERSED) 2 MG/2ML injection, , , ,  .  acetaminophen (TYLENOL) tablet 650 mg, 650 mg, Oral, Q6H PRN, Adefeso, Oladapo, DO, 650 mg at 04/02/20 2013 .  albuterol (VENTOLIN HFA) 108 (90 Base) MCG/ACT inhaler 2 puff, 2 puff, Inhalation, Q4H PRN, Adefeso, Oladapo, DO .  albuterol (VENTOLIN HFA) 108 (90 Base) MCG/ACT inhaler 2 puff, 2 puff, Inhalation, Q6H, Adefeso, Oladapo, DO, 2 puff at 04/03/20 1042 .  ALPRAZolam Prudy Feeler(XANAX) tablet 0.5 mg, 0.5 mg, Oral, TID PRN, Mariea ClontsEmokpae, Courage, MD, 0.5 mg at 04/03/20 0608 .  busPIRone (BUSPAR) tablet 10 mg, 10 mg, Oral, TID, Emokpae, Courage, MD, 10 mg at 04/02/20 2309 .  chlorpheniramine-HYDROcodone (TUSSIONEX) 10-8 MG/5ML suspension 5 mL, 5 mL, Oral, Q12H PRN, Adefeso, Oladapo, DO, 5 mL at 04/03/20 0607 .  dextromethorphan-guaiFENesin (MUCINEX DM) 30-600 MG per 12 hr tablet 1 tablet, 1 tablet, Oral, BID, Adefeso, Oladapo, DO, 1 tablet at 04/02/20 2309 .  feeding supplement (ENSURE ENLIVE / ENSURE PLUS) liquid 237 mL, 237 mL, Oral, BID BM, Adefeso, Oladapo, DO .  guaiFENesin-dextromethorphan (ROBITUSSIN DM) 100-10 MG/5ML  syrup 10 mL, 10 mL, Oral, Q4H PRN, Adefeso, Oladapo, DO .  hydrOXYzine (ATARAX/VISTARIL) tablet 25 mg, 25 mg, Oral, TID PRN, Rai, Ripudeep K, MD .  insulin aspart (novoLOG) injection 0-5 Units, 0-5 Units, Subcutaneous, QHS, Adefeso, Oladapo, DO .  insulin aspart (novoLOG) injection 0-9 Units, 0-9 Units, Subcutaneous, TID WC, Adefeso, Oladapo, DO .  methylPREDNISolone sodium succinate (SOLU-MEDROL) 40 mg/mL injection 26 mg, 0.5 mg/kg, Intravenous, Q12H, 26 mg at 04/03/20 1035 **FOLLOWED BY** [START ON 04/05/2020] predniSONE (DELTASONE) tablet 50 mg, 50 mg, Oral, Daily, Adefeso, Oladapo, DO .  nicotine (NICODERM CQ - dosed in mg/24 hours) patch 14 mg, 14 mg, Transdermal, Daily, Emokpae, Courage, MD, 14 mg at 04/03/20 1034 .  pantoprazole (PROTONIX) EC tablet 40 mg, 40 mg, Oral, Daily, Adefeso, Oladapo, DO .  piperacillin-tazobactam (ZOSYN) IVPB 3.375 g, 3.375 g, Intravenous, Q8H, Emokpae, Courage, MD, Last Rate: 12.5 mL/hr at 04/03/20 0609, 3.375 g at 04/03/20 0609 .  [COMPLETED] remdesivir 100 mg in sodium chloride 0.9 % 100 mL IVPB, 100 mg, Intravenous, Once, Stopped at 04/02/20 0853 **FOLLOWED BY** [COMPLETED] remdesivir 100 mg in sodium chloride 0.9 % 100 mL IVPB, 100 mg, Intravenous, Once, Stopped at 04/02/20 1251 **FOLLOWED BY** remdesivir 100 mg in sodium chloride 0.9 % 100 mL IVPB, 100 mg, Intravenous, Daily, Bell, Michelle T, RPH, Last Rate: 200 mL/hr at 04/03/20 1040, 100 mg at 04/03/20 1040 .  traZODone (DESYREL) tablet 100 mg, 100 mg, Oral, QHS, Emokpae, Courage, MD, 100 mg at 04/02/20 2309 .  vancomycin (VANCOREADY) IVPB 500 mg/100 mL, 500 mg, Intravenous, Q12H, Nyoka CowdenWert, Michael B, MD, Last Rate:  100 mL/hr at 04/03/20 0436, 500 mg at 04/03/20 0436    History reviewed. No pertinent family history.  Social History   Socioeconomic History  . Marital status: Single    Spouse name: Not on file  . Number of children: Not on file  . Years of education: Not on file  . Highest education level:  Not on file  Occupational History  . Not on file  Tobacco Use  . Smoking status: Current Every Day Smoker    Packs/day: 0.50    Types: Cigarettes  . Smokeless tobacco: Never Used  Vaping Use  . Vaping Use: Never used  Substance and Sexual Activity  . Alcohol use: Not Currently  . Drug use: Not Currently    Comment: hx of opoid abuse and methadone  . Sexual activity: Yes  Other Topics Concern  . Not on file  Social History Narrative  . Not on file   Social Determinants of Health   Financial Resource Strain: Not on file  Food Insecurity: Not on file  Transportation Needs: Not on file  Physical Activity: Not on file  Stress: Not on file  Social Connections: Not on file    Review of Systems: A 12 point ROS discussed and pertinent positives are indicated in the HPI above.  All other systems are negative.  Review of Systems  Vital Signs: BP 110/78 (BP Location: Right Arm)   Pulse 96   Temp 97.8 F (36.6 C) (Oral)   Resp 19   Ht 5\' 7"  (1.702 m)   Wt 50.2 kg   SpO2 99%   BMI 17.33 kg/m   Physical Exam Constitutional:      Appearance: Normal appearance.  HENT:     Mouth/Throat:     Mouth: Mucous membranes are moist.     Pharynx: Oropharynx is clear.  Cardiovascular:     Rate and Rhythm: Normal rate and regular rhythm.     Heart sounds: Normal heart sounds.  Pulmonary:     Effort: Pulmonary effort is normal. No respiratory distress.     Comments: Absent left BS Skin:    General: Skin is warm and dry.  Neurological:     General: No focal deficit present.     Mental Status: She is alert and oriented to person, place, and time.  Psychiatric:        Mood and Affect: Mood normal.        Thought Content: Thought content normal.        Judgment: Judgment normal.     Imaging: CT Angio Chest PE W and/or Wo Contrast  Result Date: 04/02/2020 CLINICAL DATA:  COVID exposure, dyspnea, cough, malaise, positive D-dimer EXAM: CT ANGIOGRAPHY CHEST WITH CONTRAST  TECHNIQUE: Multidetector CT imaging of the chest was performed using the standard protocol during bolus administration of intravenous contrast. Multiplanar CT image reconstructions and MIPs were obtained to evaluate the vascular anatomy. CONTRAST:  04/04/2020 OMNIPAQUE IOHEXOL 350 MG/ML SOLN COMPARISON:  None. FINDINGS: Cardiovascular: There is adequate opacification of the pulmonary arterial tree. There is no intraluminal filling defect identified to suggest acute pulmonary embolism. The central pulmonary arteries are of normal caliber. There is marked mediastinal shift to the right. Global cardiac size within normal limits. No significant coronary artery calcification. No pericardial effusion. The thoracic aorta is unremarkable. Mediastinum/Nodes: Thyroid unremarkable. Soft tissue within the anterior mediastinum likely represents rebound thymic or residual thymic tissue. The esophagus is unremarkable. No pathologic thoracic adenopathy is identified. Lungs/Pleura: There is a massive left  pleural effusion which completely fills the left hemithorax, completely collapses the left lung, and demonstrates marked mass effect upon the mediastinum with marked mediastinal shift to the right. There is interstitial gas within a portion of the left lower lobe likely representing the lateral segment of the left lower lobe which may represent necrosis of the setting of necrotizing pneumonia. Mild patchy ground-glass infiltrate within the a right upper lobe anteriorly is nonspecific, possibly infectious or inflammatory. A bilobed fluid density structure is seen within the right middle lobe measuring 2.3 x 3.6 x 1.6 cm in greatest dimension, nonspecific, but possibly the sequela of prior infection or trauma. Upper Abdomen: No acute abnormality. Musculoskeletal: No acute bone abnormality. Review of the MIP images confirms the above findings. IMPRESSION: No pulmonary embolism. Massive left pleural effusion, possibly representing a a  parapneumonic effusion or empyema, demonstrating marked mass effect upon the mediastinum with marked left right mediastinal shift. Extensive interstitial gas within the probable lateral segment of the right lower lobe suggesting parenchymal necrosis in the setting of necrotizing pneumonia. Bilobed fluid-filled structure within the right middle lobe measuring 3.6 cm possibly representing the sequela of remote trauma or inflammation. Minimal patchy infiltrate within the right upper lobe, likely infectious or inflammatory. Electronically Signed   By: Helyn Numbers MD   On: 04/02/2020 04:18   DG Chest Portable 1 View  Result Date: 04/02/2020 CLINICAL DATA:  Large LEFT pleural effusion EXAM: PORTABLE CHEST 1 VIEW COMPARISON:  Portable exam at 1203 compared to 0227 hrs FINDINGS: Resolution of previously identified LEFT to RIGHT mediastinal shift. Unable to assess heart size due to subtotal opacification of the LEFT hemithorax. Minimal aeration now seen in LEFT lung. No pneumothorax. Nodular foci at inferior RIGHT lung again seen with minimal RIGHT basilar atelectasis. Osseous structures unremarkable. IMPRESSION: No pneumothorax following LEFT thoracentesis. Resolution of LEFT to RIGHT mediastinal shift. Persistent nodular foci at inferior RIGHT hemithorax. Electronically Signed   By: Ulyses Southward M.D.   On: 04/02/2020 12:32   DG Chest Port 1 View  Result Date: 04/02/2020 CLINICAL DATA:  COVID, shortness of breath EXAM: PORTABLE CHEST 1 VIEW COMPARISON:  None. FINDINGS: Complete whiteout of the left hemithorax, likely related to a combination of effusion and airspace disease. Heart and mediastinal structures are shifted to the right. Right mid lung patchy airspace disease. No effusion on the right. No acute bony abnormality. IMPRESSION: IMPRESSION Complete opacification of the left hemithorax, likely related to effusion and airspace disease. Patchy right mid lung airspace disease. Electronically Signed   By:  Charlett Nose M.D.   On: 04/02/2020 02:48   US THORACENTESIS ASP PLEURAL SPACE W/IMG GUIDE  Result Date: 04/02/2020 INDICATION: Large LEFT pleural effusion EXAM: ULTRASOUND GUIDED DIAGNSOTIC AND THERAPEUTIC LEFT THORACENTESIS MEDICATIONS: None. COMPLICATIONS: None immediate. PROCEDURE: Procedure, benefits, and risks of procedure were discussed with patient. Written informed consent for procedure was obtained. Time out protocol followed. Pleural effusion localized by ultrasound at the posterior LEFT hemithorax. Fluid is markedly complex containing diffuse internal echogenicity as well as dependent echogenicity. Skin prepped and draped in usual sterile fashion. Skin and soft tissues anesthetized with 10 mL of 1% lidocaine. 8 French thoracentesis catheter placed into the LEFT pleural space. 1.88 L of complex cloudy tan fluid with particulates with purulent appearance aspirated by syringe pump. Findings most likely represent empyema. Procedure tolerated well by patient without immediate complication. FINDINGS: Complex purulent appearing LEFT pleural fluid likely representing empyema. IMPRESSION: Successful ultrasound guided LEFT thoracentesis yielding 1.88 L of pleural fluid.  Electronically Signed   By: Ulyses Southward M.D.   On: 04/02/2020 12:39    Labs:  CBC: Recent Labs    04/02/20 0158 04/03/20 0008  WBC 9.5 8.5  HGB 12.3 11.2*  HCT 36.2 33.4*  PLT 558* 584*    COAGS: No results for input(s): INR, APTT in the last 8760 hours.  BMP: Recent Labs    04/02/20 0158 04/02/20 0728  NA 125* 125*  K 3.9 4.3  CL 87* 87*  CO2 28 29  GLUCOSE 115* 110*  BUN 15 16  CALCIUM 8.5* 8.4*  CREATININE 0.69 0.70  GFRNONAA >60 >60    LIVER FUNCTION TESTS: Recent Labs    04/02/20 0158  BILITOT 0.7  AST 50*  ALT 41  ALKPHOS 68  PROT 7.9  ALBUMIN 2.4*    TUMOR MARKERS: No results for input(s): AFPTM, CEA, CA199, CHROMGRNA in the last 8760 hours.  Assessment and Plan: Left chest empyema For  image guided chest tube placement. Labs reviewed. Risks and benefits discussed with the patient including bleeding, infection, damage to adjacent structures, lung perforation/fistula connection, and sepsis.  All of the patient's questions were answered, patient is agreeable to proceed. Consent signed and in chart.    Thank you for this interesting consult.  I greatly enjoyed meeting Alyssa Vargas and look forward to participating in their care.  A copy of this report was sent to the requesting provider on this date.  Electronically Signed: Brayton El, PA-C 04/03/2020, 11:15 AM   I spent a total of 20 minutes in face to face in clinical consultation, greater than 50% of which was counseling/coordinating care for left chest empyema drain

## 2020-04-04 ENCOUNTER — Inpatient Hospital Stay (HOSPITAL_COMMUNITY): Payer: Medicaid Other

## 2020-04-04 DIAGNOSIS — J869 Pyothorax without fistula: Secondary | ICD-10-CM

## 2020-04-04 DIAGNOSIS — Z9689 Presence of other specified functional implants: Secondary | ICD-10-CM

## 2020-04-04 DIAGNOSIS — F1990 Other psychoactive substance use, unspecified, uncomplicated: Secondary | ICD-10-CM

## 2020-04-04 LAB — COMPREHENSIVE METABOLIC PANEL
ALT: 30 U/L (ref 0–44)
AST: 27 U/L (ref 15–41)
Albumin: 1.7 g/dL — ABNORMAL LOW (ref 3.5–5.0)
Alkaline Phosphatase: 47 U/L (ref 38–126)
Anion gap: 10 (ref 5–15)
BUN: 17 mg/dL (ref 6–20)
CO2: 25 mmol/L (ref 22–32)
Calcium: 7.9 mg/dL — ABNORMAL LOW (ref 8.9–10.3)
Chloride: 101 mmol/L (ref 98–111)
Creatinine, Ser: 0.73 mg/dL (ref 0.44–1.00)
GFR, Estimated: >60 mL/min (ref >60)
Glucose, Bld: 121 mg/dL — ABNORMAL HIGH (ref 70–99)
Potassium: 4 mmol/L (ref 3.5–5.1)
Sodium: 136 mmol/L (ref 135–145)
Total Bilirubin: 0.5 mg/dL (ref 0.3–1.2)
Total Protein: 5.8 g/dL — ABNORMAL LOW (ref 6.5–8.1)

## 2020-04-04 LAB — D-DIMER, QUANTITATIVE: D-Dimer, Quant: 1.41 ug/mL-FEU — ABNORMAL HIGH (ref 0.00–0.50)

## 2020-04-04 LAB — PHOSPHORUS: Phosphorus: 3.1 mg/dL (ref 2.5–4.6)

## 2020-04-04 LAB — CBC WITH DIFFERENTIAL/PLATELET
Abs Immature Granulocytes: 0.28 10*3/uL — ABNORMAL HIGH (ref 0.00–0.07)
Basophils Absolute: 0 10*3/uL (ref 0.0–0.1)
Basophils Relative: 0 %
Eosinophils Absolute: 0 10*3/uL (ref 0.0–0.5)
Eosinophils Relative: 0 %
HCT: 38.7 % (ref 36.0–46.0)
Hemoglobin: 12.3 g/dL (ref 12.0–15.0)
Immature Granulocytes: 5 %
Lymphocytes Relative: 27 %
Lymphs Abs: 1.6 10*3/uL (ref 0.7–4.0)
MCH: 27.2 pg (ref 26.0–34.0)
MCHC: 31.8 g/dL (ref 30.0–36.0)
MCV: 85.4 fL (ref 80.0–100.0)
Monocytes Absolute: 0.4 10*3/uL (ref 0.1–1.0)
Monocytes Relative: 7 %
Neutro Abs: 3.7 10*3/uL (ref 1.7–7.7)
Neutrophils Relative %: 61 %
Platelets: 485 10*3/uL — ABNORMAL HIGH (ref 150–400)
RBC: 4.53 MIL/uL (ref 3.87–5.11)
RDW: 13.6 % (ref 11.5–15.5)
WBC: 6 10*3/uL (ref 4.0–10.5)
nRBC: 0 % (ref 0.0–0.2)

## 2020-04-04 LAB — GLUCOSE, CAPILLARY
Glucose-Capillary: 66 mg/dL — ABNORMAL LOW (ref 70–99)
Glucose-Capillary: 91 mg/dL (ref 70–99)
Glucose-Capillary: 93 mg/dL (ref 70–99)

## 2020-04-04 LAB — MAGNESIUM: Magnesium: 1.8 mg/dL (ref 1.7–2.4)

## 2020-04-04 LAB — C-REACTIVE PROTEIN: CRP: 1.9 mg/dL — ABNORMAL HIGH (ref <1.0)

## 2020-04-04 LAB — FERRITIN: Ferritin: 264 ng/mL (ref 11–307)

## 2020-04-04 MED ORDER — SODIUM CHLORIDE 0.9% FLUSH
10.0000 mL | INTRAVENOUS | Status: DC | PRN
Start: 2020-04-04 — End: 2020-04-05

## 2020-04-04 MED ORDER — SODIUM CHLORIDE 0.9% FLUSH: 10.0000 mL | Freq: Two times a day (BID) | INTRAVENOUS | Status: DC

## 2020-04-04 MED ORDER — ENOXAPARIN SODIUM 40 MG/0.4ML ~~LOC~~ SOLN
40.0000 mg | SUBCUTANEOUS | Status: DC
  Administered 2020-04-04 – 2020-04-15 (×11): 40 mg via SUBCUTANEOUS
  Filled 2020-04-04 (×13): qty 0.4

## 2020-04-04 NOTE — Progress Notes (Signed)
Patient able to ambulate to bathroom with standby assist, denies SOB.  PRN dilaudid given for complaint of pain.  Chest tube dressing c/d/i, draining without difficulty.  50 ml dark yellow drainage noted.  Patient resting comfortably in bed at this time, side rails up x 3.  Told to call for assistance getting up.

## 2020-04-04 NOTE — Progress Notes (Addendum)
[address]       [place] [address]             [phone]         Subjective: Feels pain from chest, not significantly SOB, Covid + , large empyema on left  Objective: Vital signs in last 24 hours: Temp:  [97.7 F (36.5 C)-98.7 F (37.1 C)] 97.7 F (36.5 C) (12/19 1201) Pulse Rate:  [86-104] 101 (12/19 1201) Cardiac Rhythm: Normal sinus rhythm (12/19 0833) Resp:  [19-21] 19 (12/19 1201) BP: (89-113)/(62-82) 111/81 (12/19 1201) SpO2:  [99 %-100 %] 100 % (12/19 1201)  Hemodynamic parameters for last 24 hours:    Intake/Output from previous day: 12/18 0701 - 12/19 0700 In: 838 [P.O.:838] Out: 510 [Chest Tube:510] Intake/Output this shift: No intake/output data recorded.  Physical Exam  Constitutional: No distress. She appears acutely ill.  HENT:  Nose: No nasal discharge.  Mouth/Throat: No dental caries. Oropharynx is clear. Pharynx is normal.  Eyes: Pupils are equal, round, and reactive to light.  Neck: No JVD present. No neck adenopathy.  Cardiovascular: Normal rate, regular rhythm, normal heart sounds and normal pulses.  Pulmonary/Chest: She has no wheezes. She has no rales.  Dim BS left hemithorax  Abdominal: Soft. Bowel sounds are normal. She exhibits no distension and no mass. There is no abdominal tenderness.  Musculoskeletal:        General: Normal range of motion.     Cervical back: Normal range of motion and neck supple.  Neurological: She is oriented to person, place, and time.  Skin: Skin is warm and dry. No cyanosis. No jaundice. Nails show no clubbing.   History reviewed. No pertinent past medical history. History reviewed. No pertinent surgical history.  NKA  Review of Systems  Respiratory: Positive for cough and sputum production.   All other systems reviewed and are negative.   Social History   Occupational History  . Not on file  Tobacco Use  . Smoking status: Current Every Day Smoker    Packs/day: 0.50     Types: Cigarettes  . Smokeless tobacco: Never Used  Vaping Use  . Vaping Use: Never used  Substance and Sexual Activity  . Alcohol use: Not Currently  . Drug use: Not Currently    Comment: hx of opoid abuse and methadone  . Sexual activity: Yes    Lab Results: Recent Labs    04/03/20 0008 04/04/20 0415  WBC 8.5 6.0  HGB 11.2* 12.3  HCT 33.4* 38.7  PLT 584* 485*   BMET:  Recent Labs    04/02/20 0728 04/04/20 0415  NA 125* 136  K 4.3 4.0  CL 87* 101  CO2 29 25  GLUCOSE 110* 121*  BUN 16 17  CREATININE 0.70 0.73  CALCIUM 8.4* 7.9*    PT/INR: No results for input(s): LABPROT, INR in the last 72 hours. ABG No results found for: PHART, HCO3, TCO2, ACIDBASEDEF, O2SAT CBG (last 3)  Recent Labs    04/02/20 2323 04/03/20 2028 04/04/20 0756  GLUCAP 103* 115* 66*    Meds Scheduled Meds: . albuterol  2 puff Inhalation Q6H  . busPIRone  10 mg Oral TID  . dextromethorphan-guaiFENesin  1 tablet Oral BID  . enoxaparin (LOVENOX) injection  40 mg Subcutaneous Q24H  . feeding supplement  237 mL Oral BID BM  . insulin aspart  0-5 Units Subcutaneous QHS  . insulin aspart  0-9 Units Subcutaneous TID WC  . methylPREDNISolone (SOLU-MEDROL)  injection  0.5 mg/kg Intravenous Q12H   Followed by  . [START ON 04/05/2020] predniSONE  50 mg Oral Daily  . nicotine  14 mg Transdermal Daily  . pantoprazole  40 mg Oral Daily  . traZODone  100 mg Oral QHS   Continuous Infusions: . piperacillin-tazobactam (ZOSYN)  IV 3.375 g (04/04/20 0514)  . remdesivir 100 mg in NS 100 mL 100 mg (04/04/20 0842)  . vancomycin 750 mg (04/04/20 0358)   PRN Meds:.acetaminophen, albuterol, ALPRAZolam, chlorpheniramine-HYDROcodone, guaiFENesin-dextromethorphan, HYDROmorphone (DILAUDID) injection, hydrOXYzine, oxyCODONE-acetaminophen  Xrays DG Chest Port 1 View  Result Date: 04/04/2020 CLINICAL DATA:  Shortness of breath.  History of COVID. EXAM: PORTABLE CHEST 1 VIEW COMPARISON:  Chest radiograph  04/02/2020. FINDINGS: Monitoring leads overlie the patient. Cardiac and mediastinal contours largely obscured. Interval insertion left chest tube. Interval decrease in size of left pleural fluid within the upper hemithorax and mid hemithorax. Similar-appearing nodular opacity right mid lung. IMPRESSION: 1. Interval insertion left chest tube with interval decrease in size of left pleural fluid. 2. Similar-appearing nodular opacity right mid lung. Electronically Signed   By: Annia Beltrew  Vinsant M.D.   On: 04/04/2020 10:46   DG Chest Portable 1 View  Result Date: 04/02/2020 CLINICAL DATA:  Large LEFT pleural effusion EXAM: PORTABLE CHEST 1 VIEW COMPARISON:  Portable exam at 1203 compared to 0227 hrs FINDINGS: Resolution of previously identified LEFT to RIGHT mediastinal shift. Unable to assess heart size due to subtotal opacification of the LEFT hemithorax. Minimal aeration now seen in LEFT lung. No pneumothorax. Nodular foci at inferior RIGHT lung again seen with minimal RIGHT basilar atelectasis. Osseous structures unremarkable. IMPRESSION: No pneumothorax following LEFT thoracentesis. Resolution of LEFT to RIGHT mediastinal shift. Persistent nodular foci at inferior RIGHT hemithorax. Electronically Signed   By: Ulyses SouthwardMark  Boles M.D.   On: 04/02/2020 12:32   [facility] CATH W/IMG GUIDE  Result Date: 04/03/2020 CLINICAL DATA:  Large left pleural empyema, COVID-19 infection and history of IV drug use. Request for percutaneous thoracostomy tube placement to drain the empyema. EXAM: CT GUIDED CATHETER DRAINAGE OF LEFT PLEURAL EMPYEMA ANESTHESIA/SEDATION: 2.0 mg IV Versed 100 mcg IV Fentanyl Total Moderate Sedation Time:  10 minutes The patient's level of consciousness and physiologic status were continuously monitored during the procedure by Radiology nursing. PROCEDURE: The procedure, risks, benefits, and alternatives were explained to the patient. Questions regarding the procedure were encouraged and  answered. The patient understands and consents to the procedure. A time out was performed prior to initiating the procedure. CT was performed through the chest in a supine position. The left chest wall was prepped with chlorhexidine in a sterile fashion, and a sterile drape was applied covering the operative field. A sterile gown and sterile gloves were used for the procedure. Local anesthesia was provided with 1% Lidocaine. Under CT guidance, an 18 gauge trocar needle was advanced into the left pleural space from an anterolateral approach. After confirming needle tip position, fluid was aspirated. A guidewire was advanced and the needle removed. The percutaneous tract was dilated and a 14 French pigtail drainage catheter advanced into the left pleural space. Catheter position was confirmed by CT. The pigtail catheter was connected to a Pleur-evac device. The catheter was secured at the skin with a Prolene retention suture, StatLock device and dressed with a Vaseline gauze dressing. COMPLICATIONS: None FINDINGS: A large amount of residual left pleural fluid remains despite large volume thoracentesis yesterday. Aspiration from the pleural space yielded grossly purulent fluid. Repeat laboratory  analysis was not performed as the fluid removed at the time of thoracentesis yesterday was sent for laboratory testing. After placement of the 14 French drainage catheter, there is good return of purulent fluid. The Pleur-evac will be connected to wall suction at -20 cm of water when the patient returns to her hospital room. IMPRESSION: CT-guided placement of 14 French left chest tube. A 14 French drainage catheter was placed with return of purulent fluid. The drainage catheter was attached to a Pleur-evac device which will be attached to wall suction. Electronically Signed   By: Irish Lack M.D.   On: 04/03/2020 13:33   ECHOCARDIOGRAM LIMITED  Result Date: 04/03/2020    ECHOCARDIOGRAM LIMITED REPORT   Patient Name:    TAUNIA Ocallaghan Date of Exam: 04/03/2020 Medical Rec #:  [id number]     Height:       67.0 in Accession #:    [id number]    Weight:       110.7 lb Date of Birth:  1985/09/04     BSA:          1.573 m Patient Age:    34 years      BP:           109/79 mmHg Patient Gender: F             HR:           99 bpm. Exam Location:  Inpatient Procedure: Limited Echo, Limited Color Doppler and Cardiac Doppler Indications:    endocarditis  History:        Patient has no prior history of Echocardiogram examinations.                 Covid. Pleural effusion.; Risk Factors:Current Smoker and IV                 drug use.  Sonographer:    Delcie Roch Referring Phys: [id number] COURAGE EMOKPAE  Sonographer Comments: Image acquisition challenging due to respiratory motion. IMPRESSIONS  1. Left ventricular ejection fraction, by estimation, is 60 to 65%. The left ventricle has normal function.  2. Right ventricular systolic function is normal. The right ventricular size is normal.  3. A small pericardial effusion is present. The pericardial effusion is circumferential.  4. The mitral valve is normal in structure. No evidence of mitral valve regurgitation. No evidence of mitral stenosis.  5. The aortic valve was not well visualized. Aortic valve regurgitation is not visualized. No aortic stenosis is present.  6. The inferior vena cava is normal in size with greater than 50% respiratory variability, suggesting right atrial pressure of 3 mmHg. FINDINGS  Left Ventricle: Left ventricular ejection fraction, by estimation, is 60 to 65%. The left ventricle has normal function. Right Ventricle: The right ventricular size is normal. Right vetricular wall thickness was not well visualized. Right ventricular systolic function is normal. Pericardium: A small pericardial effusion is present. The pericardial effusion is circumferential. Mitral Valve: The mitral valve is normal in structure. No evidence of mitral valve stenosis. Tricuspid Valve: The  tricuspid valve is normal in structure. Tricuspid valve regurgitation is not demonstrated. No evidence of tricuspid stenosis. Aortic Valve: The aortic valve was not well visualized. Aortic valve regurgitation is not visualized. No aortic stenosis is present. Pulmonic Valve: The pulmonic valve was normal in structure. Pulmonic valve regurgitation is not visualized. No evidence of pulmonic stenosis. Venous: The inferior vena cava is normal in size with greater than 50% respiratory variability, suggesting right atrial pressure  of 3 mmHg. LEFT VENTRICLE PLAX 2D LVIDd:         3.90 cm LVIDs:         2.30 cm LV IVS:        0.70 cm LVOT diam:     1.60 cm LVOT Area:     2.01 cm  IVC IVC diam: 1.90 cm LEFT ATRIUM         Index LA diam:    2.10 cm 1.34 cm/m   AORTA Ao Root diam: 2.20 cm MITRAL VALVE MV Area (PHT): 5.23 cm    SHUNTS MV Decel Time: 145 msec    Systemic Diam: 1.60 cm MV E velocity: 88.70 cm/s MV A velocity: 61.70 cm/s MV E/A ratio:  1.44 Dina Rich MD Electronically signed by Dina Rich MD Signature Date/Time: 04/03/2020/2:31:28 PM    Final     Assessment/Plan:  Large left empyema, will make NPO after MN Poss VATS tomorrow       LOS: 2 days    Rowe Clack PA-C 04/04/2020   Will keep patient NPO past MN Follow up chest xray in am If no improvement  will consider left VATS tomorrow  I have seen and examined Bennie Pierini and agree with the above assessment  and plan.  Delight Ovens MD Beeper [phone] Office [phone] 04/04/2020 2:55 PM

## 2020-04-04 NOTE — Progress Notes (Signed)
[facility]                                                                              Patient Demographics  Alyssa Vargas, is a 34 y.o. female, DOB - [birth year], ZOX:[id number]  Admit date - 04/01/2020   Admitting Physician Courage Mariea Clonts, MD  Outpatient Primary MD for the patient is Patient, No Pcp Per  Outpatient specialists:   LOS - 2  days   Medical records reviewed and are as summarized below:    Chief Complaint  Patient presents with  . Covid Exposure       Brief summary   34 yo WF with H/o IVDU (iv Heroin and iv Methamphetamine) and Tobacco abuse admitted on 04/02/2020 to [facility] with dyspnea, productive cough, loss of sense of taste and smell, anorexia, generalized aches, pains, malaise fatigue and myalgias and found to be positive for COVID-19 infection with chest x-ray consistent with very very large left-sided pleural effusion with mediastinal shift,-left-sided thoracentesis on 04/02/2020 you did purulent fluid consistent with empyema with Gram stain showing GPC -Pulmonology and CT surgery consult appreciated Patient was transferred to [facility] for chest tube placement under CT guidance.  Subjective:   Alyssa Vargas was seen and examined today, she does deny any fever, chills, she reports generalized weakness, and planes of pain at chest tube insertion site.  Assessment & Plan   Massive left-sided empyema lung with mediastinal shift -Large left-sided empyema, CT surgery, Dr. Tyrone Sage was consulted by Dr Mariea Clonts, recommended IR to place large bore chest tube to drain the purulent/empyema fluid, no plans for VATS -Continue IV Zosyn and vancomycin, pulmonology following -Pleural cultures growing gram-positive cocci. -Status post left-sided thoracentesis on 12/17, 1.88 L purulent appearing fluid removed, Gram stain with GPC, sensitivities still pending -Status post 14 French drain placement in the left pleural empyema by IR on 12/18.  Active  Problems: Acute hypoxic respiratory failure  -Multifactorial due to acute COVID-19 viral pneumonia and due to large left-sided empyema - Continue IV solumedrol,  Remdesivir per pharmacy protocol,  - Continue Supportive care: albuterol, Tylenol, I-S, flutter valve, proning advised. - Continue to wean oxygen, ambulatory O2 screening daily as tolerated  - Oxygen - SpO2: 99 % O2 Flow Rate (L/min): 2 L/min - Continue to follow labs as below  COVID-19 infection -Continue with IV steroids and Remdesivir  Lab Results  Component Value Date   SARSCOV2NAA POSITIVE (A) 04/01/2020     Recent Labs  Lab 04/02/20 0158 04/04/20 0415  DDIMER 1.69* 1.41*  FERRITIN  --  264  CRP  --  1.9*  ALT 41 30    History of IV drug use (IV heroin, methamphetamine) -Patient reported she last used IV methamphetamine and heroin on Sunday, 03/28/20 -Follow closely for any withdrawals  Depression, anorexia -Denies suicidal or homicidal ideation or plan -Continue BuSpar   Hyponatremia -Resolved with hydration.  Tobacco use -Nicotine patch placed    Code Status: Full CODE STATUS DVT Prophylaxis: Subcu Lovenox Family Communication: Discussed all imaging results, lab results, explained to the patient  Disposition Plan:     Status is: Inpatient  Remains inpatient appropriate because:Inpatient  level of care appropriate due to severity of illness   Dispo: The patient is from: Home              Anticipated d/c is to: Home              Anticipated d/c date is: > 3 days              Patient currently is not medically stable to d/c.  Will need chest tube placement, on active Covid treatment       Procedures:  -Status post left-sided thoracentesis on 12/17, 1.88 L purulent appearing fluid removed, Gram stain with GPC, sensitivities still pending -Status post 59 French drain placement in the left pleural empyema by IR on 12/18.  Consultants:   PCCM CT surgery Intervention  radiology  Antimicrobials:   Anti-infectives (From admission, onward)   Start     Dose/Rate Route Frequency Ordered Stop   04/03/20 1600  vancomycin (VANCOREADY) IVPB 750 mg/150 mL        750 mg 150 mL/hr over 60 Minutes Intravenous Every 12 hours 04/03/20 1500     04/03/20 1000  remdesivir 100 mg in sodium chloride 0.9 % 100 mL IVPB  Status:  Discontinued       "Followed by" Linked Group Details   100 mg 200 mL/hr over 30 Minutes Intravenous Daily 04/02/20 0654 04/02/20 0656   04/03/20 1000  remdesivir 100 mg in sodium chloride 0.9 % 100 mL IVPB       "Followed by" Linked Group Details   100 mg 200 mL/hr over 30 Minutes Intravenous Daily 04/02/20 0658 04/07/20 0959   04/03/20 0400  vancomycin (VANCOREADY) IVPB 500 mg/100 mL  Status:  Discontinued        500 mg 100 mL/hr over 60 Minutes Intravenous Every 12 hours 04/02/20 1459 04/03/20 1500   04/02/20 2200  piperacillin-tazobactam (ZOSYN) IVPB 3.375 g        3.375 g 12.5 mL/hr over 240 Minutes Intravenous Every 8 hours 04/02/20 1332     04/02/20 1515  vancomycin (VANCOREADY) IVPB 1250 mg/250 mL  Status:  Discontinued        1,250 mg 166.7 mL/hr over 90 Minutes Intravenous Every 24 hours 04/02/20 1449 04/02/20 1449   04/02/20 1515  vancomycin (VANCOREADY) IVPB 1250 mg/250 mL        1,250 mg 166.7 mL/hr over 90 Minutes Intravenous  Once 04/02/20 1449 04/02/20 1701   04/02/20 1345  piperacillin-tazobactam (ZOSYN) IVPB 3.375 g        3.375 g 100 mL/hr over 30 Minutes Intravenous  Once 04/02/20 1332 04/02/20 1434   04/02/20 0900  remdesivir 100 mg in sodium chloride 0.9 % 100 mL IVPB       "Followed by" Linked Group Details   100 mg 200 mL/hr over 30 Minutes Intravenous  Once 04/02/20 0658 04/02/20 1251   04/02/20 0800  remdesivir 100 mg in sodium chloride 0.9 % 100 mL IVPB       "Followed by" Linked Group Details   100 mg 200 mL/hr over 30 Minutes Intravenous  Once 04/02/20 0658 04/02/20 0853   04/02/20 0700  remdesivir 200 mg in  sodium chloride 0.9% 250 mL IVPB  Status:  Discontinued       "Followed by" Linked Group Details   200 mg 580 mL/hr over 30 Minutes Intravenous Once 04/02/20 0654 04/02/20 0656         Medications  Scheduled Meds: . albuterol  2 puff Inhalation Q6H  .  busPIRone  10 mg Oral TID  . dextromethorphan-guaiFENesin  1 tablet Oral BID  . feeding supplement  237 mL Oral BID BM  . insulin aspart  0-5 Units Subcutaneous QHS  . insulin aspart  0-9 Units Subcutaneous TID WC  . methylPREDNISolone (SOLU-MEDROL) injection  0.5 mg/kg Intravenous Q12H   Followed by  . [START ON 04/05/2020] predniSONE  50 mg Oral Daily  . nicotine  14 mg Transdermal Daily  . pantoprazole  40 mg Oral Daily  . traZODone  100 mg Oral QHS   Continuous Infusions: . piperacillin-tazobactam (ZOSYN)  IV 3.375 g (04/04/20 0514)  . remdesivir 100 mg in NS 100 mL 100 mg (04/04/20 0842)  . vancomycin 750 mg (04/04/20 0358)   PRN Meds:.acetaminophen, albuterol, ALPRAZolam, chlorpheniramine-HYDROcodone, guaiFENesin-dextromethorphan, HYDROmorphone (DILAUDID) injection, hydrOXYzine, oxyCODONE-acetaminophen       Objective:   Vitals:   04/03/20 1938 04/04/20 0036 04/04/20 0447 04/04/20 0752  BP: 107/73 111/82 102/71 113/79  Pulse: 100  86 100  Resp: 20  (!) 21 20  Temp: 98 F (36.7 C) 98.2 F (36.8 C) 98.6 F (37 C) 98.2 F (36.8 C)  TempSrc: Oral Oral Oral Oral  SpO2: 99%  100% 99%  Weight:      Height:        Intake/Output Summary (Last 24 hours) at 04/04/2020 1138 Last data filed at 04/04/2020 0514 Gross per 24 hour  Intake 838 ml  Output 510 ml  Net 328 ml     Wt Readings from Last 3 Encounters:  04/02/20 50.2 kg  05/27/19 58.1 kg  05/26/19 58.1 kg     Exam  Awake Alert, Oriented X 3, No new F.N deficits, Normal affect Symmetrical Chest wall movement, left side chest tube, diminished air entry in the lrft lung RRR,No Gallops,Rubs or new Murmurs, No Parasternal Heave +ve B.Sounds, Abd  Soft, No tenderness, No rebound - guarding or rigidity. No Cyanosis, Clubbing or edema, No new Rash or bruise     Data Reviewed:  I have personally reviewed following labs and imaging studies  Micro Results Recent Results (from the past 240 hour(s))  Resp Panel by RT-PCR (Flu A&B, Covid) Nasopharyngeal Swab     Status: Abnormal   Collection Time: 04/01/20 10:42 PM   Specimen: Nasopharyngeal Swab; Nasopharyngeal(NP) swabs in vial transport medium  Result Value Ref Range Status   SARS Coronavirus 2 by RT PCR POSITIVE (A) NEGATIVE Final    Comment: RESULT CALLED TO, READ BACK BY AND VERIFIED WITH: T WALKER,RN@2337  04/01/20 MKELLY (NOTE) SARS-CoV-2 target nucleic acids are DETECTED.  The SARS-CoV-2 RNA is generally detectable in upper respiratory specimens during the acute phase of infection. Positive results are indicative of the presence of the identified virus, but do not rule out bacterial infection or co-infection with other pathogens not detected by the test. Clinical correlation with patient history and other diagnostic information is necessary to determine patient infection status. The expected result is Negative.  Fact Sheet for Patients: BloggerCourse.com  Fact Sheet for Healthcare Providers: SeriousBroker.it  This test is not yet approved or cleared by the [place] FDA and  has been authorized for detection and/or diagnosis of SARS-CoV-2 by FDA under an Emergency Use Authorization (EUA).  This EUA will remain in effect (meaning this test can be  used) for the duration of  the COVID-19 declaration under Section 564(b)(1) of the Act, 21 U.S.C. section 360bbb-3(b)(1), unless the authorization is terminated or revoked sooner.     Influenza A by PCR NEGATIVE  NEGATIVE Final   Influenza B by PCR NEGATIVE NEGATIVE Final    Comment: (NOTE) The Xpert Xpress SARS-CoV-2/FLU/RSV plus assay is intended as an aid in the  diagnosis of influenza from Nasopharyngeal swab specimens and should not be used as a sole basis for treatment. Nasal washings and aspirates are unacceptable for Xpert Xpress SARS-CoV-2/FLU/RSV testing.  Fact Sheet for Patients: BloggerCourse.com  Fact Sheet for Healthcare Providers: SeriousBroker.it  This test is not yet approved or cleared by the [place] FDA and has been authorized for detection and/or diagnosis of SARS-CoV-2 by FDA under an Emergency Use Authorization (EUA). This EUA will remain in effect (meaning this test can be used) for the duration of the COVID-19 declaration under Section 564(b)(1) of the Act, 21 U.S.C. section 360bbb-3(b)(1), unless the authorization is terminated or revoked.  Performed at [facility], [address] [place], [place] [address]   Gram stain     Status: None   Collection Time: 04/02/20 11:40 AM   Specimen: Pleura; Body Fluid  Result Value Ref Range Status   Specimen Description PLEURAL  Final   Special Requests NONE  Final   Gram Stain   Final    GRAM POSITIVE COCCI Gram Stain Report Called to,Read Back By and Verified With: EASTER,T@1417  by MATTHEWS, B 12.17.21 WBC PRESENT,BOTH PMN AND MONONUCLEAR Performed at [facility], [address], [place], [place] [address]    Report Status 04/02/2020 FINAL  Final  Culture, body fluid-bottle     Status: None (Preliminary result)   Collection Time: 04/02/20 11:40 AM   Specimen: Pleura  Result Value Ref Range Status   Specimen Description   Final    PLEURAL Performed at [facility], [address] [place], [place] [address]    Special Requests   Final    NONE Performed at [facility], [address] [place], [place] [address]    Gram Stain   Final    GRAM POSITIVE COCCI AEROBIC BOTTLE Gram Stain Report Called to,Read Back By and Verified With: EASTER,T ON 04/02/20 AT 2040 BY LOY,C Performed at [facility] ANAEROBIC  BOTTLE ALSO Performed at [facility], [address], [place], [place] [address]    Culture [facility] POSITIVE COCCI  Final   Report Status PENDING  Incomplete  Culture, blood (Routine X 2) w Reflex to ID Panel     Status: None (Preliminary result)   Collection Time: 04/02/20  2:48 PM   Specimen: BLOOD  Result Value Ref Range Status   Specimen Description BLOOD LEFT ANTECUBITAL  Final   Special Requests   Final    BOTTLES DRAWN AEROBIC AND ANAEROBIC Blood Culture adequate volume   Culture   Final    NO GROWTH < 12 HOURS Performed at [facility], [address] [place], [place] [address]    Report Status PENDING  Incomplete  MRSA PCR Screening     Status: None   Collection Time: 04/03/20  7:11 AM   Specimen: Nasal Mucosa; Nasopharyngeal  Result Value Ref Range Status   MRSA by PCR NEGATIVE NEGATIVE Final    Comment:        The GeneXpert MRSA Assay (FDA approved for NASAL specimens only), is one component of a comprehensive MRSA colonization surveillance program. It is not intended to diagnose MRSA infection nor to guide or monitor treatment for MRSA infections. Performed at [facility], [address], [place], [place] [address]     Radiology Reports CT Angio Chest PE W and/or  Wo Contrast  Result Date: 04/02/2020 CLINICAL DATA:  COVID exposure, dyspnea, cough, malaise, positive D-dimer EXAM: CT ANGIOGRAPHY CHEST WITH CONTRAST TECHNIQUE: Multidetector CT imaging of the chest was performed using the standard protocol during bolus administration of intravenous contrast. Multiplanar CT image reconstructions and MIPs were obtained to evaluate the vascular anatomy. CONTRAST:  OMNIPAQUE IOHEXOL 350 MG/ML SOLN COMPARISON:  None. FINDINGS: Cardiovascular: There is adequate opacification of the pulmonary arterial tree. There is no intraluminal filling defect identified to suggest acute pulmonary embolism. The central pulmonary arteries are of normal caliber. There is marked  mediastinal shift to the right. Global cardiac size within normal limits. No significant coronary artery calcification. No pericardial effusion. The thoracic aorta is unremarkable. Mediastinum/Nodes: Thyroid unremarkable. Soft tissue within the anterior mediastinum likely represents rebound thymic or residual thymic tissue. The esophagus is unremarkable. No pathologic thoracic adenopathy is identified. Lungs/Pleura: There is a massive left pleural effusion which completely fills the left hemithorax, completely collapses the left lung, and demonstrates marked mass effect upon the mediastinum with marked mediastinal shift to the right. There is interstitial gas within a portion of the left lower lobe likely representing the lateral segment of the left lower lobe which may represent necrosis of the setting of necrotizing pneumonia. Mild patchy ground-glass infiltrate within the a right upper lobe anteriorly is nonspecific, possibly infectious or inflammatory. A bilobed fluid density structure is seen within the right middle lobe measuring 2.3 x 3.6 x 1.6 cm in greatest dimension, nonspecific, but possibly the sequela of prior infection or trauma. Upper Abdomen: No acute abnormality. Musculoskeletal: No acute bone abnormality. Review of the MIP images confirms the above findings. IMPRESSION: No pulmonary embolism. Massive left pleural effusion, possibly representing a a parapneumonic effusion or empyema, demonstrating marked mass effect upon the mediastinum with marked left right mediastinal shift. Extensive interstitial gas within the probable lateral segment of the right lower lobe suggesting parenchymal necrosis in the setting of necrotizing pneumonia. Bilobed fluid-filled structure within the right middle lobe measuring 3.6 cm possibly representing the sequela of remote trauma or inflammation. Minimal patchy infiltrate within the right upper lobe, likely infectious or inflammatory. Electronically Signed   By: Helyn Numbers MD   On: 04/02/2020 04:18   DG Chest Port 1 View  Result Date: 04/04/2020 CLINICAL DATA:  Shortness of breath.  History of COVID. EXAM: PORTABLE CHEST 1 VIEW COMPARISON:  Chest radiograph 04/02/2020. FINDINGS: Monitoring leads overlie the patient. Cardiac and mediastinal contours largely obscured. Interval insertion left chest tube. Interval decrease in size of left pleural fluid within the upper hemithorax and mid hemithorax. Similar-appearing nodular opacity right mid lung. IMPRESSION: 1. Interval insertion left chest tube with interval decrease in size of left pleural fluid. 2. Similar-appearing nodular opacity right mid lung. Electronically Signed   By: Annia Belt M.D.   On: 04/04/2020 10:46   DG Chest Portable 1 View  Result Date: 04/02/2020 CLINICAL DATA:  Large LEFT pleural effusion EXAM: PORTABLE CHEST 1 VIEW COMPARISON:  Portable exam at 1203 compared to 0227 hrs FINDINGS: Resolution of previously identified LEFT to RIGHT mediastinal shift. Unable to assess heart size due to subtotal opacification of the LEFT hemithorax. Minimal aeration now seen in LEFT lung. No pneumothorax. Nodular foci at inferior RIGHT lung again seen with minimal RIGHT basilar atelectasis. Osseous structures unremarkable. IMPRESSION: No pneumothorax following LEFT thoracentesis. Resolution of LEFT to RIGHT mediastinal shift. Persistent nodular foci at inferior RIGHT hemithorax. Electronically Signed   By: Angelyn Punt.D.  On: 04/02/2020 12:32   DG Chest Port 1 View  Result Date: 04/02/2020 CLINICAL DATA:  COVID, shortness of breath EXAM: PORTABLE CHEST 1 VIEW COMPARISON:  None. FINDINGS: Complete whiteout of the left hemithorax, likely related to a combination of effusion and airspace disease. Heart and mediastinal structures are shifted to the right. Right mid lung patchy airspace disease. No effusion on the right. No acute bony abnormality. IMPRESSION: IMPRESSION Complete opacification of the left  hemithorax, likely related to effusion and airspace disease. Patchy right mid lung airspace disease. Electronically Signed   By: Charlett NoseKevin  Dover M.D.   On: 04/02/2020 02:48   [facility]/IMG GUIDE  Result Date: 04/03/2020 CLINICAL DATA:  Large left pleural empyema, COVID-19 infection and history of IV drug use. Request for percutaneous thoracostomy tube placement to drain the empyema. EXAM: CT GUIDED CATHETER DRAINAGE OF LEFT PLEURAL EMPYEMA ANESTHESIA/SEDATION: 2.0 mg IV Versed 100 mcg IV Fentanyl Total Moderate Sedation Time:  10 minutes The patient's level of consciousness and physiologic status were continuously monitored during the procedure by Radiology nursing. PROCEDURE: The procedure, risks, benefits, and alternatives were explained to the patient. Questions regarding the procedure were encouraged and answered. The patient understands and consents to the procedure. A time out was performed prior to initiating the procedure. CT was performed through the chest in a supine position. The left chest wall was prepped with chlorhexidine in a sterile fashion, and a sterile drape was applied covering the operative field. A sterile gown and sterile gloves were used for the procedure. Local anesthesia was provided with 1% Lidocaine. Under CT guidance, an 18 gauge trocar needle was advanced into the left pleural space from an anterolateral approach. After confirming needle tip position, fluid was aspirated. A guidewire was advanced and the needle removed. The percutaneous tract was dilated and a 14 French pigtail drainage catheter advanced into the left pleural space. Catheter position was confirmed by CT. The pigtail catheter was connected to a Pleur-evac device. The catheter was secured at the skin with a Prolene retention suture, StatLock device and dressed with a Vaseline gauze dressing. COMPLICATIONS: None FINDINGS: A large amount of residual left pleural fluid remains despite large  volume thoracentesis yesterday. Aspiration from the pleural space yielded grossly purulent fluid. Repeat laboratory analysis was not performed as the fluid removed at the time of thoracentesis yesterday was sent for laboratory testing. After placement of the 14 French drainage catheter, there is good return of purulent fluid. The Pleur-evac will be connected to wall suction at -20 cm of water when the patient returns to her hospital room. IMPRESSION: CT-guided placement of 14 French left chest tube. A 14 French drainage catheter was placed with return of purulent fluid. The drainage catheter was attached to a Pleur-evac device which will be attached to wall suction. Electronically Signed   By: Irish LackGlenn  Yamagata M.D.   On: 04/03/2020 13:33   ECHOCARDIOGRAM LIMITED  Result Date: 04/03/2020    ECHOCARDIOGRAM LIMITED REPORT   Patient Name:   Herbert SetaHEATHER Settlemyre Date of Exam: 04/03/2020 Medical Rec #:  [id number]     Height:       67.0 in Accession #:    [id number]    Weight:       110.7 lb Date of Birth:  Dec 29, 1985     BSA:          1.573 m Patient Age:    34 years      BP:  109/79 mmHg Patient Gender: F             HR:           99 bpm. Exam Location:  Inpatient Procedure: Limited Echo, Limited Color Doppler and Cardiac Doppler Indications:    endocarditis  History:        Patient has no prior history of Echocardiogram examinations.                 Covid. Pleural effusion.; Risk Factors:Current Smoker and IV                 drug use.  Sonographer:    Delcie Roch Referring Phys: [id number] COURAGE EMOKPAE  Sonographer Comments: Image acquisition challenging due to respiratory motion. IMPRESSIONS  1. Left ventricular ejection fraction, by estimation, is 60 to 65%. The left ventricle has normal function.  2. Right ventricular systolic function is normal. The right ventricular size is normal.  3. A small pericardial effusion is present. The pericardial effusion is circumferential.  4. The mitral valve is normal in  structure. No evidence of mitral valve regurgitation. No evidence of mitral stenosis.  5. The aortic valve was not well visualized. Aortic valve regurgitation is not visualized. No aortic stenosis is present.  6. The inferior vena cava is normal in size with greater than 50% respiratory variability, suggesting right atrial pressure of 3 mmHg. FINDINGS  Left Ventricle: Left ventricular ejection fraction, by estimation, is 60 to 65%. The left ventricle has normal function. Right Ventricle: The right ventricular size is normal. Right vetricular wall thickness was not well visualized. Right ventricular systolic function is normal. Pericardium: A small pericardial effusion is present. The pericardial effusion is circumferential. Mitral Valve: The mitral valve is normal in structure. No evidence of mitral valve stenosis. Tricuspid Valve: The tricuspid valve is normal in structure. Tricuspid valve regurgitation is not demonstrated. No evidence of tricuspid stenosis. Aortic Valve: The aortic valve was not well visualized. Aortic valve regurgitation is not visualized. No aortic stenosis is present. Pulmonic Valve: The pulmonic valve was normal in structure. Pulmonic valve regurgitation is not visualized. No evidence of pulmonic stenosis. Venous: The inferior vena cava is normal in size with greater than 50% respiratory variability, suggesting right atrial pressure of 3 mmHg. LEFT VENTRICLE PLAX 2D LVIDd:         3.90 cm LVIDs:         2.30 cm LV IVS:        0.70 cm LVOT diam:     1.60 cm LVOT Area:     2.01 cm  IVC IVC diam: 1.90 cm LEFT ATRIUM         Index LA diam:    2.10 cm 1.34 cm/m   AORTA Ao Root diam: 2.20 cm MITRAL VALVE MV Area (PHT): 5.23 cm    SHUNTS MV Decel Time: 145 msec    Systemic Diam: 1.60 cm MV E velocity: 88.70 cm/s MV A velocity: 61.70 cm/s MV E/A ratio:  1.44 Dina Rich MD Electronically signed by Dina Rich MD Signature Date/Time: 04/03/2020/2:31:28 PM    Final    US THORACENTESIS ASP  PLEURAL SPACE W/IMG GUIDE  Result Date: 04/02/2020 INDICATION: Large LEFT pleural effusion EXAM: ULTRASOUND GUIDED DIAGNSOTIC AND THERAPEUTIC LEFT THORACENTESIS MEDICATIONS: None. COMPLICATIONS: None immediate. PROCEDURE: Procedure, benefits, and risks of procedure were discussed with patient. Written informed consent for procedure was obtained. Time out protocol followed. Pleural effusion localized by ultrasound at the posterior LEFT hemithorax. Fluid is markedly  complex containing diffuse internal echogenicity as well as dependent echogenicity. Skin prepped and draped in usual sterile fashion. Skin and soft tissues anesthetized with 10 mL of 1% lidocaine. 8 French thoracentesis catheter placed into the LEFT pleural space. 1.88 L of complex cloudy tan fluid with particulates with purulent appearance aspirated by syringe pump. Findings most likely represent empyema. Procedure tolerated well by patient without immediate complication. FINDINGS: Complex purulent appearing LEFT pleural fluid likely representing empyema. IMPRESSION: Successful ultrasound guided LEFT thoracentesis yielding 1.88 L of pleural fluid. Electronically Signed   By: Ulyses Southward M.D.   On: 04/02/2020 12:39    Lab Data:  CBC: Recent Labs  Lab 04/02/20 0158 04/03/20 0008 04/04/20 0415  WBC 9.5 8.5 6.0  NEUTROABS 6.8 4.7 3.7  HGB 12.3 11.2* 12.3  HCT 36.2 33.4* 38.7  MCV 84.2 82.5 85.4  PLT 558* 584* 485*   Basic Metabolic Panel: Recent Labs  Lab 04/02/20 0158 04/02/20 0728 04/04/20 0415  NA 125* 125* 136  K 3.9 4.3 4.0  CL 87* 87* 101  CO2 28 29 25   GLUCOSE 115* 110* 121*  BUN 15 16 17   CREATININE 0.69 0.70 0.73  CALCIUM 8.5* 8.4* 7.9*  MG 1.8  --  1.8  PHOS 4.2  --  3.1   GFR: Estimated Creatinine Clearance: 80 mL/min (by C-G formula based on SCr of 0.73 mg/dL). Liver Function Tests: Recent Labs  Lab 04/02/20 0158 04/04/20 0415  AST 50* 27  ALT 41 30  ALKPHOS 68 47  BILITOT 0.7 0.5  PROT 7.9 5.8*   ALBUMIN 2.4* 1.7*   No results for input(s): LIPASE, AMYLASE in the last 168 hours. No results for input(s): AMMONIA in the last 168 hours. Coagulation Profile: No results for input(s): INR, PROTIME in the last 168 hours. Cardiac Enzymes: No results for input(s): CKTOTAL, CKMB, CKMBINDEX, TROPONINI in the last 168 hours. BNP (last 3 results) No results for input(s): PROBNP in the last 8760 hours. HbA1C: No results for input(s): HGBA1C in the last 72 hours. CBG: Recent Labs  Lab 04/02/20 1120 04/02/20 1623 04/02/20 2323 04/03/20 2028 04/04/20 0756  GLUCAP 116* 117* 103* 115* 66*   Lipid Profile: No results for input(s): CHOL, HDL, LDLCALC, TRIG, CHOLHDL, LDLDIRECT in the last 72 hours. Thyroid Function Tests: No results for input(s): TSH, T4TOTAL, FREET4, T3FREE, THYROIDAB in the last 72 hours. Anemia Panel: Recent Labs    04/04/20 0415  FERRITIN 264   Urine analysis: No results found for: COLORURINE, APPEARANCEUR, LABSPEC, PHURINE, GLUCOSEU, HGBUR, BILIRUBINUR, KETONESUR, PROTEINUR, UROBILINOGEN, NITRITE, [place] Lakelyn Straus M.D. [facility] 04/04/2020, 11:38 AM   Call night coverage person covering after 7pm

## 2020-04-05 ENCOUNTER — Inpatient Hospital Stay (HOSPITAL_COMMUNITY): Payer: Medicaid Other

## 2020-04-05 ENCOUNTER — Encounter (HOSPITAL_COMMUNITY)
Admission: EM | Disposition: A | Discharge status: Home / Self Care | Payer: Self-pay | Source: Home / Self Care | Attending: Internal Medicine

## 2020-04-05 ENCOUNTER — Inpatient Hospital Stay (HOSPITAL_COMMUNITY): Payer: Medicaid Other | Admitting: Registered Nurse

## 2020-04-05 DIAGNOSIS — J869 Pyothorax without fistula: Principal | ICD-10-CM

## 2020-04-05 HISTORY — PX: VIDEO ASSISTED THORACOSCOPY (VATS)/EMPYEMA: SHX6172

## 2020-04-05 LAB — CBC WITH DIFFERENTIAL/PLATELET
Abs Immature Granulocytes: 0 10*3/uL (ref 0.00–0.07)
Basophils Absolute: 0 10*3/uL (ref 0.0–0.1)
Basophils Relative: 0 %
Eosinophils Absolute: 0 10*3/uL (ref 0.0–0.5)
Eosinophils Relative: 0 %
HCT: 32.9 % — ABNORMAL LOW (ref 36.0–46.0)
Hemoglobin: 10.6 g/dL — ABNORMAL LOW (ref 12.0–15.0)
Lymphocytes Relative: 25 %
Lymphs Abs: 1.8 10*3/uL (ref 0.7–4.0)
MCH: 27.8 pg (ref 26.0–34.0)
MCHC: 32.2 g/dL (ref 30.0–36.0)
MCV: 86.4 fL (ref 80.0–100.0)
Monocytes Absolute: 0.3 10*3/uL (ref 0.1–1.0)
Monocytes Relative: 4 %
Neutro Abs: 5.2 10*3/uL (ref 1.7–7.7)
Neutrophils Relative %: 71 %
Platelets: 584 10*3/uL — ABNORMAL HIGH (ref 150–400)
RBC: 3.81 MIL/uL — ABNORMAL LOW (ref 3.87–5.11)
RDW: 13.9 % (ref 11.5–15.5)
WBC: 7.3 10*3/uL (ref 4.0–10.5)
nRBC: 0 % (ref 0.0–0.2)
nRBC: 0 /100 WBC

## 2020-04-05 LAB — CYTOLOGY - NON PAP

## 2020-04-05 LAB — CULTURE, BODY FLUID W GRAM STAIN -BOTTLE

## 2020-04-05 LAB — D-DIMER, QUANTITATIVE: D-Dimer, Quant: 1.85 ug/mL-FEU — ABNORMAL HIGH (ref 0.00–0.50)

## 2020-04-05 LAB — ABO/RH: ABO/RH(D): A POS

## 2020-04-05 LAB — COMPREHENSIVE METABOLIC PANEL
ALT: 21 U/L (ref 0–44)
AST: 19 U/L (ref 15–41)
Albumin: 1.6 g/dL — ABNORMAL LOW (ref 3.5–5.0)
Alkaline Phosphatase: 40 U/L (ref 38–126)
Anion gap: 8 (ref 5–15)
BUN: 13 mg/dL (ref 6–20)
CO2: 25 mmol/L (ref 22–32)
Calcium: 7.5 mg/dL — ABNORMAL LOW (ref 8.9–10.3)
Chloride: 106 mmol/L (ref 98–111)
Creatinine, Ser: 0.76 mg/dL (ref 0.44–1.00)
GFR, Estimated: >60 mL/min (ref >60)
Glucose, Bld: 114 mg/dL — ABNORMAL HIGH (ref 70–99)
Potassium: 4.4 mmol/L (ref 3.5–5.1)
Sodium: 139 mmol/L (ref 135–145)
Total Bilirubin: 0.3 mg/dL (ref 0.3–1.2)
Total Protein: 5.2 g/dL — ABNORMAL LOW (ref 6.5–8.1)

## 2020-04-05 LAB — GLUCOSE, CAPILLARY
Glucose-Capillary: 100 mg/dL — ABNORMAL HIGH (ref 70–99)
Glucose-Capillary: 158 mg/dL — ABNORMAL HIGH (ref 70–99)

## 2020-04-05 LAB — C-REACTIVE PROTEIN: CRP: 1 mg/dL — ABNORMAL HIGH (ref <1.0)

## 2020-04-05 LAB — TYPE AND SCREEN
ABO/RH(D): A POS
Antibody Screen: NEGATIVE

## 2020-04-05 LAB — MAGNESIUM: Magnesium: 1.7 mg/dL (ref 1.7–2.4)

## 2020-04-05 LAB — PHOSPHORUS: Phosphorus: 3.1 mg/dL (ref 2.5–4.6)

## 2020-04-05 LAB — FERRITIN: Ferritin: 191 ng/mL (ref 11–307)

## 2020-04-05 SURGERY — VIDEO ASSISTED THORACOSCOPY (VATS)/EMPYEMA
Anesthesia: General | Site: Chest | Laterality: Left

## 2020-04-05 MED ORDER — KETAMINE HCL 10 MG/ML IJ SOLN
INTRAMUSCULAR | Status: DC | PRN
  Administered 2020-04-05: 50 mg via INTRAVENOUS

## 2020-04-05 MED ORDER — HYDROMORPHONE HCL 1 MG/ML IJ SOLN
0.2500 mg | INTRAMUSCULAR | Status: DC | PRN
  Administered 2020-04-05 (×4): 0.5 mg via INTRAVENOUS

## 2020-04-05 MED ORDER — SODIUM CHLORIDE 0.9 % IV SOLN: INTRAVENOUS | Status: DC

## 2020-04-05 MED ORDER — MAGNESIUM SULFATE 2 GM/50ML IV SOLN
2.0000 g | Freq: Once | INTRAVENOUS | Status: AC
  Administered 2020-04-05: 2 g via INTRAVENOUS
  Filled 2020-04-05: qty 50

## 2020-04-05 MED ORDER — HYDROMORPHONE HCL 1 MG/ML IJ SOLN
1.0000 mg | INTRAMUSCULAR | Status: DC | PRN
Start: 2020-04-05 — End: 2020-04-06
  Administered 2020-04-05 – 2020-04-06 (×5): 1 mg via INTRAVENOUS
  Filled 2020-04-05 (×6): qty 1

## 2020-04-05 MED ORDER — ONDANSETRON HCL 4 MG/2ML IJ SOLN
INTRAMUSCULAR | Status: DC | PRN
  Administered 2020-04-05: 4 mg via INTRAVENOUS

## 2020-04-05 MED ORDER — SODIUM CHLORIDE 0.9 % IV SOLN: INTRAVENOUS | Status: DC | PRN

## 2020-04-05 MED ORDER — SUGAMMADEX SODIUM 200 MG/2ML IV SOLN
INTRAVENOUS | Status: DC | PRN
  Administered 2020-04-05 (×2): 100 mg via INTRAVENOUS

## 2020-04-05 MED ORDER — FENTANYL CITRATE (PF) 250 MCG/5ML IJ SOLN
INTRAMUSCULAR | Status: AC
  Filled 2020-04-05: qty 5

## 2020-04-05 MED ORDER — HYDROMORPHONE HCL 1 MG/ML IJ SOLN
1.0000 mg | INTRAMUSCULAR | Status: DC | PRN
  Administered 2020-04-05: 1 mg via INTRAVENOUS
  Filled 2020-04-05: qty 1

## 2020-04-05 MED ORDER — HYDROMORPHONE HCL 1 MG/ML IJ SOLN
0.5000 mg | INTRAMUSCULAR | Status: AC | PRN
Start: 2020-04-05 — End: 2020-04-05
  Administered 2020-04-05 (×2): 0.5 mg via INTRAVENOUS

## 2020-04-05 MED ORDER — MIDAZOLAM HCL 2 MG/2ML IJ SOLN
INTRAMUSCULAR | Status: AC
  Filled 2020-04-05: qty 2

## 2020-04-05 MED ORDER — ACETAMINOPHEN 160 MG/5ML PO SOLN: 1000.0000 mg | Freq: Four times a day (QID) | ORAL | Status: DC

## 2020-04-05 MED ORDER — ACETAMINOPHEN 500 MG PO TABS
1000.0000 mg | ORAL_TABLET | Freq: Four times a day (QID) | ORAL | Status: DC
  Administered 2020-04-05 – 2020-04-07 (×8): 1000 mg via ORAL
  Filled 2020-04-05 (×8): qty 2

## 2020-04-05 MED ORDER — STERILE WATER FOR IRRIGATION IR SOLN
Status: DC | PRN
  Administered 2020-04-05: 1000 mL

## 2020-04-05 MED ORDER — DEXAMETHASONE SODIUM PHOSPHATE 10 MG/ML IJ SOLN
INTRAMUSCULAR | Status: DC | PRN
  Administered 2020-04-05: 10 mg via INTRAVENOUS

## 2020-04-05 MED ORDER — SODIUM CHLORIDE 0.9 % IR SOLN
Status: DC | PRN
  Administered 2020-04-05: 3000 mL

## 2020-04-05 MED ORDER — ROCURONIUM BROMIDE 10 MG/ML (PF) SYRINGE
PREFILLED_SYRINGE | INTRAVENOUS | Status: DC | PRN
  Administered 2020-04-05: 30 mg via INTRAVENOUS
  Administered 2020-04-05: 60 mg via INTRAVENOUS

## 2020-04-05 MED ORDER — HYDROMORPHONE HCL 1 MG/ML IJ SOLN
INTRAMUSCULAR | Status: AC
  Filled 2020-04-05: qty 2

## 2020-04-05 MED ORDER — SUCCINYLCHOLINE CHLORIDE 200 MG/10ML IV SOSY
PREFILLED_SYRINGE | INTRAVENOUS | Status: DC | PRN
  Administered 2020-04-05: 120 mg via INTRAVENOUS

## 2020-04-05 MED ORDER — ENSURE ENLIVE PO LIQD
237.0000 mL | Freq: Three times a day (TID) | ORAL | Status: DC
  Administered 2020-04-06 – 2020-04-29 (×28): 237 mL via ORAL

## 2020-04-05 MED ORDER — ACETAMINOPHEN 10 MG/ML IV SOLN
1000.0000 mg | Freq: Once | INTRAVENOUS | Status: AC
  Administered 2020-04-05: 1000 mg via INTRAVENOUS

## 2020-04-05 MED ORDER — LACTATED RINGERS IV SOLN: INTRAVENOUS | Status: DC | PRN

## 2020-04-05 MED ORDER — BUPIVACAINE HCL (PF) 0.5 % IJ SOLN
INTRAMUSCULAR | Status: AC
  Filled 2020-04-05: qty 30

## 2020-04-05 MED ORDER — ADULT MULTIVITAMIN W/MINERALS CH
1.0000 | ORAL_TABLET | Freq: Every day | ORAL | Status: DC
  Administered 2020-04-06 – 2020-04-29 (×23): 1 via ORAL
  Filled 2020-04-05 (×22): qty 1

## 2020-04-05 MED ORDER — ACETAMINOPHEN 10 MG/ML IV SOLN
INTRAVENOUS | Status: AC
  Filled 2020-04-05: qty 100

## 2020-04-05 MED ORDER — SODIUM CHLORIDE 0.9 % IV SOLN: 2.0000 g | INTRAVENOUS | Status: DC

## 2020-04-05 MED ORDER — PROPOFOL 10 MG/ML IV BOLUS
INTRAVENOUS | Status: DC | PRN
  Administered 2020-04-05: 160 mg via INTRAVENOUS

## 2020-04-05 MED ORDER — PROPOFOL 10 MG/ML IV BOLUS
INTRAVENOUS | Status: AC
  Filled 2020-04-05: qty 20

## 2020-04-05 MED ORDER — FENTANYL CITRATE (PF) 250 MCG/5ML IJ SOLN
INTRAMUSCULAR | Status: DC | PRN
  Administered 2020-04-05 (×3): 50 ug via INTRAVENOUS
  Administered 2020-04-05: 100 ug via INTRAVENOUS

## 2020-04-05 MED ORDER — BISACODYL 5 MG PO TBEC
10.0000 mg | DELAYED_RELEASE_TABLET | Freq: Every day | ORAL | Status: DC
  Administered 2020-04-05 – 2020-04-06 (×2): 10 mg via ORAL
  Filled 2020-04-05 (×4): qty 2

## 2020-04-05 MED ORDER — DEXMEDETOMIDINE (PRECEDEX) IN NS 20 MCG/5ML (4 MCG/ML) IV SYRINGE
PREFILLED_SYRINGE | INTRAVENOUS | Status: DC | PRN
  Administered 2020-04-05: 4 ug via INTRAVENOUS
  Administered 2020-04-05 (×4): 8 ug via INTRAVENOUS

## 2020-04-05 MED ORDER — CHLORHEXIDINE GLUCONATE CLOTH 2 % EX PADS
6.0000 | MEDICATED_PAD | Freq: Every day | CUTANEOUS | Status: DC
  Administered 2020-04-05 – 2020-04-20 (×14): 6 via TOPICAL

## 2020-04-05 MED ORDER — 0.9 % SODIUM CHLORIDE (POUR BTL) OPTIME
TOPICAL | Status: DC | PRN
  Administered 2020-04-05: 2000 mL

## 2020-04-05 MED ORDER — KETAMINE HCL 50 MG/5ML IJ SOSY
PREFILLED_SYRINGE | INTRAMUSCULAR | Status: AC
  Filled 2020-04-05: qty 5

## 2020-04-05 MED ORDER — METHYLPREDNISOLONE SODIUM SUCC 40 MG IJ SOLR
40.0000 mg | Freq: Every day | INTRAMUSCULAR | Status: DC
  Administered 2020-04-06: 40 mg via INTRAVENOUS
  Filled 2020-04-05: qty 1

## 2020-04-05 MED ORDER — BUPIVACAINE LIPOSOME 1.3 % IJ SUSP
INTRAMUSCULAR | Status: DC | PRN
  Administered 2020-04-05: 30 mL

## 2020-04-05 MED ORDER — OXYCODONE HCL 5 MG PO TABS
5.0000 mg | ORAL_TABLET | ORAL | Status: DC | PRN
Start: 2020-04-05 — End: 2020-04-07
  Administered 2020-04-05 – 2020-04-07 (×8): 5 mg via ORAL
  Filled 2020-04-05 (×8): qty 1

## 2020-04-05 MED ORDER — ONDANSETRON HCL 4 MG/2ML IJ SOLN: 4.0000 mg | Freq: Four times a day (QID) | INTRAMUSCULAR | Status: DC | PRN

## 2020-04-05 MED ORDER — MIDAZOLAM HCL 5 MG/5ML IJ SOLN
INTRAMUSCULAR | Status: DC | PRN
  Administered 2020-04-05: 2 mg via INTRAVENOUS

## 2020-04-05 MED ORDER — SENNOSIDES-DOCUSATE SODIUM 8.6-50 MG PO TABS
1.0000 | ORAL_TABLET | Freq: Every day | ORAL | Status: DC
  Administered 2020-04-05 – 2020-04-08 (×3): 1 via ORAL
  Filled 2020-04-05 (×3): qty 1

## 2020-04-05 SURGICAL SUPPLY — 88 items
APPLICATOR TIP COSEAL (VASCULAR PRODUCTS) IMPLANT
APPLICATOR TIP EXT COSEAL (VASCULAR PRODUCTS) IMPLANT
BLADE CLIPPER SURG (BLADE) IMPLANT
BLADE SURG 11 STRL SS (BLADE) ×2 IMPLANT
CANISTER SUCT 3000ML PPV (MISCELLANEOUS) ×4 IMPLANT
CATH KIT ON-Q SILVERSOAK 5IN (CATHETERS) IMPLANT
CATH THORACIC 28FR (CATHETERS) ×2 IMPLANT
CATH THORACIC 36FR (CATHETERS) IMPLANT
CATH THORACIC 36FR RT ANG (CATHETERS) IMPLANT
CLEANER TIP ELECTROSURG 2X2 (MISCELLANEOUS) IMPLANT
CLIP VESOCCLUDE MED 6/CT (CLIP) IMPLANT
CNTNR URN SCR LID CUP LEK RST (MISCELLANEOUS) ×2 IMPLANT
CONN ST 1/4X3/8  BEN (MISCELLANEOUS) ×1
CONN ST 1/4X3/8 BEN (MISCELLANEOUS) ×1 IMPLANT
CONN Y 3/8X3/8X3/8  BEN (MISCELLANEOUS) ×1
CONN Y 3/8X3/8X3/8 BEN (MISCELLANEOUS) ×1 IMPLANT
CONT SPEC 4OZ STRL OR WHT (MISCELLANEOUS) ×2
COVER SURGICAL LIGHT HANDLE (MISCELLANEOUS) ×2 IMPLANT
DEFOGGER SCOPE WARMER CLEARIFY (MISCELLANEOUS) ×2 IMPLANT
DERMABOND ADVANCED (GAUZE/BANDAGES/DRESSINGS)
DERMABOND ADVANCED .7 DNX12 (GAUZE/BANDAGES/DRESSINGS) IMPLANT
DISSECTOR BLUNT TIP ENDO 5MM (MISCELLANEOUS) IMPLANT
DRAIN CHANNEL 28F RND 3/8 FF (WOUND CARE) ×2 IMPLANT
DRAPE LAPAROSCOPIC ABDOMINAL (DRAPES) ×2 IMPLANT
DRAPE WARM FLUID 44X44 (DRAPES) ×2 IMPLANT
ELECT BLADE 4.0 EZ CLEAN MEGAD (MISCELLANEOUS) ×2
ELECT REM PT RETURN 9FT ADLT (ELECTROSURGICAL) ×2
ELECTRODE BLDE 4.0 EZ CLN MEGD (MISCELLANEOUS) ×1 IMPLANT
ELECTRODE REM PT RTRN 9FT ADLT (ELECTROSURGICAL) ×1 IMPLANT
FILTER SMOKE EVAC ULPA (FILTER) ×2 IMPLANT
GAUZE SPONGE 4X4 12PLY STRL (GAUZE/BANDAGES/DRESSINGS) ×2 IMPLANT
GAUZE SPONGE 4X4 12PLY STRL LF (GAUZE/BANDAGES/DRESSINGS) ×2 IMPLANT
GLOVE BIO SURGEON STRL SZ 6.5 (GLOVE) ×10 IMPLANT
GOWN STRL REUS W/ TWL LRG LVL3 (GOWN DISPOSABLE) ×4 IMPLANT
GOWN STRL REUS W/TWL LRG LVL3 (GOWN DISPOSABLE) ×4
HANDPIECE INTERPULSE COAX TIP (DISPOSABLE) ×1
KIT BASIN OR (CUSTOM PROCEDURE TRAY) ×2 IMPLANT
KIT SUCTION CATH 14FR (SUCTIONS) IMPLANT
KIT TURNOVER KIT B (KITS) ×2 IMPLANT
NS IRRIG 1000ML POUR BTL (IV SOLUTION) ×4 IMPLANT
PACK CHEST (CUSTOM PROCEDURE TRAY) ×2 IMPLANT
PAD ARMBOARD 7.5X6 YLW CONV (MISCELLANEOUS) ×4 IMPLANT
PASSER SUT SWANSON 36MM LOOP (INSTRUMENTS) IMPLANT
PENCIL SMOKE EVACUATOR (MISCELLANEOUS) ×2 IMPLANT
SCISSORS LAP 5X35 DISP (ENDOMECHANICALS) IMPLANT
SEALANT PROGEL (MISCELLANEOUS) IMPLANT
SEALANT SURG COSEAL 4ML (VASCULAR PRODUCTS) IMPLANT
SEALANT SURG COSEAL 8ML (VASCULAR PRODUCTS) IMPLANT
SET HNDPC FAN SPRY TIP SCT (DISPOSABLE) ×1 IMPLANT
SLEEVE SUCTION 125 (MISCELLANEOUS) ×2 IMPLANT
SOL ANTI FOG 6CC (MISCELLANEOUS) ×1 IMPLANT
SOL PREP POV-IOD 4OZ 10% (MISCELLANEOUS) ×2 IMPLANT
SOL PREP PROV IODINE SCRUB 4OZ (MISCELLANEOUS) ×2 IMPLANT
SOLUTION ANTI FOG 6CC (MISCELLANEOUS) ×1
SUT PROLENE 3 0 SH DA (SUTURE) IMPLANT
SUT PROLENE 4 0 RB 1 (SUTURE) ×1
SUT PROLENE 4-0 RB1 .5 CRCL 36 (SUTURE) ×1 IMPLANT
SUT SILK  1 MH (SUTURE) ×3
SUT SILK 1 MH (SUTURE) ×3 IMPLANT
SUT SILK 1 TIES 10X30 (SUTURE) IMPLANT
SUT SILK 2 0SH CR/8 30 (SUTURE) IMPLANT
SUT SILK 3 0SH CR/8 30 (SUTURE) IMPLANT
SUT VIC AB 1 CTX 18 (SUTURE) ×2 IMPLANT
SUT VIC AB 1 CTX 36 (SUTURE)
SUT VIC AB 1 CTX36XBRD ANBCTR (SUTURE) IMPLANT
SUT VIC AB 2-0 CTX 36 (SUTURE) ×2 IMPLANT
SUT VIC AB 3-0 X1 27 (SUTURE) ×2 IMPLANT
SUT VICRYL 0 UR6 27IN ABS (SUTURE) IMPLANT
SUT VICRYL 2 TP 1 (SUTURE) IMPLANT
SWAB COLLECTION DEVICE MRSA (MISCELLANEOUS) IMPLANT
SWAB CULTURE ESWAB REG 1ML (MISCELLANEOUS) IMPLANT
SYR BULB IRRIG 60ML STRL (SYRINGE) ×2 IMPLANT
SYR CONTROL 10ML LL (SYRINGE) ×2 IMPLANT
SYSTEM SAHARA CHEST DRAIN ATS (WOUND CARE) ×2 IMPLANT
TAPE CLOTH 4X10 WHT NS (GAUZE/BANDAGES/DRESSINGS) ×2 IMPLANT
TAPE CLOTH SURG 4X10 WHT LF (GAUZE/BANDAGES/DRESSINGS) ×2 IMPLANT
TAPE UMBILICAL COTTON 1/8X30 (MISCELLANEOUS) IMPLANT
TIP APPLICATOR SPRAY EXTEND 16 (VASCULAR PRODUCTS) IMPLANT
TOWEL GREEN STERILE (TOWEL DISPOSABLE) ×2 IMPLANT
TOWEL GREEN STERILE FF (TOWEL DISPOSABLE) ×2 IMPLANT
TRAP FLUID SMOKE EVACUATOR (MISCELLANEOUS) ×2 IMPLANT
TRAP SPECIMEN MUCUS 40CC (MISCELLANEOUS) ×2 IMPLANT
TRAY FOLEY SLVR 16FR LF STAT (SET/KITS/TRAYS/PACK) IMPLANT
TROCAR XCEL 12X100 BLDLESS (ENDOMECHANICALS) ×2 IMPLANT
TROCAR XCEL NON-BLD 5MMX100MML (ENDOMECHANICALS) ×2 IMPLANT
TUBING LAP HI FLOW INSUFFLATIO (TUBING) ×2 IMPLANT
TUNNELER SHEATH ON-Q 11GX8 DSP (PAIN MANAGEMENT) IMPLANT
WATER STERILE IRR 1000ML POUR (IV SOLUTION) ×4 IMPLANT

## 2020-04-05 NOTE — Progress Notes (Signed)
[facility]                                                                              Patient Demographics  Alyssa Vargas, is a 34 y.o. female, DOB - January 17, 1986, WUJ:[id number]  Admit date - 04/01/2020   Admitting Physician Alyssa Mariea Clonts, MD  Outpatient Primary MD for the patient is Patient, No Pcp Per  Outpatient specialists:   LOS - 3  days   Medical records reviewed and are as summarized below:    Chief Complaint  Patient presents with  . Covid Exposure       Brief summary   34 yo WF with H/o IVDU (iv Heroin and iv Methamphetamine) and Tobacco abuse admitted on 04/02/2020 to [facility] with dyspnea, productive cough, loss of sense of taste and smell, anorexia, generalized aches, pains, malaise fatigue and myalgias and found to be positive for COVID-19 infection with chest x-ray consistent with very very large left-sided pleural effusion with mediastinal shift,-left-sided thoracentesis on 04/02/2020 you did purulent fluid consistent with empyema with Gram stain showing GPC -Pulmonology and CT surgery consult appreciated Patient was transferred to [facility] for chest tube placement under CT guidance.  Subjective:   Alyssa Vargas was seen and examined today, reports some pain at the chest tube insertion site, otherwise denies any dyspnea, chest pain, fever or chills.   Assessment & Plan   Massive left-sided empyema lung with mediastinal shift -Large left-sided empyema, CT surgery, Dr. Tyrone Sage was consulted by Dr Mariea Vargas, recommended IR to place large bore chest tube to drain the purulent/empyema fluid. -Continue IV Zosyn and vancomycin, pulmonology following -Pleural cultures growing up to coccus pneumonia, pansensitive, will narrow vancomycin and Zosyn to IV Rocephin. -Status post left-sided thoracentesis on 12/17, 1.88 L purulent appearing fluid removed, CT surgery input greatly appreciated, upon further evaluation decision has been made to proceed with a  VATS surgery, . -Status post 14 French drain placement in the left pleural empyema by IR on 12/18.  Active Problems: Acute hypoxic respiratory failure  -Multifactorial due to acute COVID-19 viral pneumonia and due to large left-sided empyema - Continue IV solumedrol,  Remdesivir per pharmacy protocol,  - Continue Supportive care: albuterol, Tylenol, I-S, flutter valve, proning advised. - Continue to wean oxygen, ambulatory O2 screening daily as tolerated  - Oxygen - SpO2: 100 % O2 Flow Rate (L/min): 2 L/min - Continue to follow labs as below  COVID-19 infection -Continue with IV steroids and Remdesivir  Lab Results  Component Value Date   SARSCOV2NAA POSITIVE (A) 04/01/2020     Recent Labs  Lab 04/02/20 0158 04/04/20 0415 04/05/20 0342  DDIMER 1.69* 1.41* 1.85*  FERRITIN  --  264 191  CRP  --  1.9* 1.0*  ALT 41 30 21    History of IV drug use (IV heroin, methamphetamine) -Patient reported she last used IV methamphetamine and heroin on Sunday, 03/28/20 -Follow closely for any withdrawals  Depression, anorexia -Denies suicidal or homicidal ideation or plan -Continue BuSpar   Hyponatremia -Resolved with hydration.  Tobacco use -Nicotine patch placed    Code Status: Full CODE STATUS DVT Prophylaxis: Subcu Lovenox Family Communication:  Discussed all imaging results, lab results, explained to the patient  Disposition Plan:     Status is: Inpatient  Remains inpatient appropriate because:Inpatient level of care appropriate due to severity of illness   Dispo: The patient is from: Home              Anticipated d/c is to: Home              Anticipated d/c date is: > 3 days              Patient currently is not medically stable to d/c.  Will need chest tube placement, on active Covid treatment       Procedures:  -Status post left-sided thoracentesis on 12/17, 1.88 L purulent appearing fluid removed, Gram stain with GPC, sensitivities still pending -Status post  59 French drain placement in the left pleural empyema by IR on 12/18.  Consultants:   PCCM CT surgery Intervention radiology  Antimicrobials:   Anti-infectives (From admission, onward)   Start     Dose/Rate Route Frequency Ordered Stop   04/03/20 1600  vancomycin (VANCOREADY) IVPB 750 mg/150 mL        750 mg 150 mL/hr over 60 Minutes Intravenous Every 12 hours 04/03/20 1500     04/03/20 1000  remdesivir 100 mg in sodium chloride 0.9 % 100 mL IVPB  Status:  Discontinued       "Followed by" Linked Group Details   100 mg 200 mL/hr over 30 Minutes Intravenous Daily 04/02/20 0654 04/02/20 0656   04/03/20 1000  remdesivir 100 mg in sodium chloride 0.9 % 100 mL IVPB       "Followed by" Linked Group Details   100 mg 200 mL/hr over 30 Minutes Intravenous Daily 04/02/20 0658 04/07/20 0959   04/03/20 0400  vancomycin (VANCOREADY) IVPB 500 mg/100 mL  Status:  Discontinued        500 mg 100 mL/hr over 60 Minutes Intravenous Every 12 hours 04/02/20 1459 04/03/20 1500   04/02/20 2200  piperacillin-tazobactam (ZOSYN) IVPB 3.375 g        3.375 g 12.5 mL/hr over 240 Minutes Intravenous Every 8 hours 04/02/20 1332     04/02/20 1515  vancomycin (VANCOREADY) IVPB 1250 mg/250 mL  Status:  Discontinued        1,250 mg 166.7 mL/hr over 90 Minutes Intravenous Every 24 hours 04/02/20 1449 04/02/20 1449   04/02/20 1515  vancomycin (VANCOREADY) IVPB 1250 mg/250 mL        1,250 mg 166.7 mL/hr over 90 Minutes Intravenous  Once 04/02/20 1449 04/02/20 1701   04/02/20 1345  piperacillin-tazobactam (ZOSYN) IVPB 3.375 g        3.375 g 100 mL/hr over 30 Minutes Intravenous  Once 04/02/20 1332 04/02/20 1434   04/02/20 0900  remdesivir 100 mg in sodium chloride 0.9 % 100 mL IVPB       "Followed by" Linked Group Details   100 mg 200 mL/hr over 30 Minutes Intravenous  Once 04/02/20 0658 04/02/20 1251   04/02/20 0800  remdesivir 100 mg in sodium chloride 0.9 % 100 mL IVPB       "Followed by" Linked Group Details    100 mg 200 mL/hr over 30 Minutes Intravenous  Once 04/02/20 0658 04/02/20 0853   04/02/20 0700  remdesivir 200 mg in sodium chloride 0.9% 250 mL IVPB  Status:  Discontinued       "Followed by" Linked Group Details   200 mg 580 mL/hr over 30 Minutes Intravenous  Once 04/02/20 0654 04/02/20 0656         Medications  Scheduled Meds: . albuterol  2 puff Inhalation Q6H  . busPIRone  10 mg Oral TID  . dextromethorphan-guaiFENesin  1 tablet Oral BID  . enoxaparin (LOVENOX) injection  40 mg Subcutaneous Q24H  . [START ON 04/06/2020] feeding supplement  237 mL Oral TID BM  . insulin aspart  0-5 Units Subcutaneous QHS  . insulin aspart  0-9 Units Subcutaneous TID WC  . [START ON 04/06/2020] multivitamin with minerals  1 tablet Oral Daily  . nicotine  14 mg Transdermal Daily  . pantoprazole  40 mg Oral Daily  . predniSONE  50 mg Oral Daily  . sodium chloride flush  10-40 mL Intracatheter Q12H  . traZODone  100 mg Oral QHS   Continuous Infusions: . piperacillin-tazobactam (ZOSYN)  IV 3.375 g (04/05/20 0522)  . remdesivir 100 mg in NS 100 mL 100 mg (04/05/20 0950)  . vancomycin 750 mg (04/05/20 0421)   PRN Meds:.acetaminophen, albuterol, ALPRAZolam, chlorpheniramine-HYDROcodone, guaiFENesin-dextromethorphan, HYDROmorphone (DILAUDID) injection, hydrOXYzine, oxyCODONE-acetaminophen, sodium chloride flush       Objective:   Vitals:   04/05/20 0000 04/05/20 0356 04/05/20 0736 04/05/20 0754  BP: 111/82 113/79 108/78   Pulse: 99 95 83   Resp: 17 18 20    Temp: 97.8 F (36.6 C) 97.8 F (36.6 C) (!) 97.4 F (36.3 C)   TempSrc: Oral Oral Oral   SpO2: 99% 99% 96% 100%  Weight:      Height:        Intake/Output Summary (Last 24 hours) at 04/05/2020 1017 Last data filed at 04/05/2020 0700 Gross per 24 hour  Intake 480 ml  Output 70 ml  Net 410 ml     Wt Readings from Last 3 Encounters:  04/02/20 50.2 kg  05/27/19 58.1 kg  05/26/19 58.1 kg     Exam  Awake Alert,  Oriented X 3, No new F.N deficits, Normal affect Symmetrical Chest wall movement, diminished air entry in the left lung with Rales RRR,No Gallops,Rubs or new Murmurs, No Parasternal Heave +ve B.Sounds, Abd Soft, No tenderness, No rebound - guarding or rigidity. No Cyanosis, Clubbing or edema, No new Rash or bruise     Data Reviewed:  I have personally reviewed following labs and imaging studies  Micro Results Recent Results (from the past 240 hour(s))  Resp Panel by RT-PCR (Flu A&B, Covid) Nasopharyngeal Swab     Status: Abnormal   Collection Time: 04/01/20 10:42 PM   Specimen: Nasopharyngeal Swab; Nasopharyngeal(NP) swabs in vial transport medium  Result Value Ref Range Status   SARS Coronavirus 2 by RT PCR POSITIVE (A) NEGATIVE Final    Comment: RESULT CALLED TO, READ BACK BY AND VERIFIED WITH: T WALKER,RN@2337  04/01/20 MKELLY (NOTE) SARS-CoV-2 target nucleic acids are DETECTED.  The SARS-CoV-2 RNA is generally detectable in upper respiratory specimens during the acute phase of infection. Positive results are indicative of the presence of the identified virus, but do not rule out bacterial infection or co-infection with other pathogens not detected by the test. Clinical correlation with patient history and other diagnostic information is necessary to determine patient infection status. The expected result is Negative.  Fact Sheet for Patients: BloggerCourse.com  Fact Sheet for Healthcare Providers: SeriousBroker.it  This test is not yet approved or cleared by the [place] FDA and  has been authorized for detection and/or diagnosis of SARS-CoV-2 by FDA under an Emergency Use Authorization (EUA).  This EUA will remain in  effect (meaning this test can be  used) for the duration of  the COVID-19 declaration under Section 564(b)(1) of the Act, 21 U.S.C. section 360bbb-3(b)(1), unless the authorization is terminated or revoked  sooner.     Influenza A by PCR NEGATIVE NEGATIVE Final   Influenza B by PCR NEGATIVE NEGATIVE Final    Comment: (NOTE) The Xpert Xpress SARS-CoV-2/FLU/RSV plus assay is intended as an aid in the diagnosis of influenza from Nasopharyngeal swab specimens and should not be used as a sole basis for treatment. Nasal washings and aspirates are unacceptable for Xpert Xpress SARS-CoV-2/FLU/RSV testing.  Fact Sheet for Patients: BloggerCourse.com  Fact Sheet for Healthcare Providers: SeriousBroker.it  This test is not yet approved or cleared by the [place] FDA and has been authorized for detection and/or diagnosis of SARS-CoV-2 by FDA under an Emergency Use Authorization (EUA). This EUA will remain in effect (meaning this test can be used) for the duration of the COVID-19 declaration under Section 564(b)(1) of the Act, 21 U.S.C. section 360bbb-3(b)(1), unless the authorization is terminated or revoked.  Performed at [facility], [address] [place], [place] [address]   Gram stain     Status: None   Collection Time: 04/02/20 11:40 AM   Specimen: Pleura; Body Fluid  Result Value Ref Range Status   Specimen Description PLEURAL  Final   Special Requests NONE  Final   Gram Stain   Final    GRAM POSITIVE COCCI Gram Stain Report Called to,Read Back By and Verified With: EASTER,T@1417  by MATTHEWS, B 12.17.21 WBC PRESENT,BOTH PMN AND MONONUCLEAR Performed at [facility], [address] [place], [place] [address]    Report Status 04/02/2020 FINAL  Final  Culture, body fluid-bottle     Status: Abnormal   Collection Time: 04/02/20 11:40 AM   Specimen: Pleura  Result Value Ref Range Status   Specimen Description   Final    PLEURAL Performed at [facility], [address] [place], [place] [address]    Special Requests   Final    NONE Performed at [facility], [address], [place], [place] [address]    Gram Stain   Final     GRAM POSITIVE COCCI AEROBIC BOTTLE Gram Stain Report Called to,Read Back By and Verified With: EASTER,T ON 04/02/20 AT 2040 BY LOY,C Performed at [facility] ANAEROBIC BOTTLE ALSO Performed at [facility], [address] [place], [place] [address]    Culture STREPTOCOCCUS PNEUMONIAE (A)  Final   Report Status 04/05/2020 FINAL  Final   Organism ID, Bacteria STREPTOCOCCUS PNEUMONIAE  Final      Susceptibility   Streptococcus pneumoniae - MIC*    ERYTHROMYCIN <=0.12 SENSITIVE Sensitive     LEVOFLOXACIN 1 SENSITIVE Sensitive     VANCOMYCIN 0.5 SENSITIVE Sensitive     PENICILLIN (meningitis) <=0.06 SENSITIVE Sensitive     PENO - penicillin <=0.06      PENICILLIN (non-meningitis) <=0.06 SENSITIVE Sensitive     PENICILLIN (oral) <=0.06 SENSITIVE Sensitive     CEFTRIAXONE (non-meningitis) <=0.12 SENSITIVE Sensitive     CEFTRIAXONE (meningitis) <=0.12 SENSITIVE Sensitive     * STREPTOCOCCUS PNEUMONIAE  Culture, blood (Routine X 2) w Reflex to ID Panel     Status: None (Preliminary result)   Collection Time: 04/02/20  2:48 PM   Specimen: BLOOD  Result Value Ref Range Status   Specimen Description BLOOD LEFT ANTECUBITAL  Final   Special Requests   Final    BOTTLES DRAWN AEROBIC AND ANAEROBIC Blood  Culture adequate volume   Culture   Final    NO GROWTH 2 DAYS Performed at [facility], [address], [place], [place] [address]    Report Status PENDING  Incomplete  MRSA PCR Screening     Status: None   Collection Time: 04/03/20  7:11 AM   Specimen: Nasal Mucosa; Nasopharyngeal  Result Value Ref Range Status   MRSA by PCR NEGATIVE NEGATIVE Final    Comment:        The GeneXpert MRSA Assay (FDA approved for NASAL specimens only), is one component of a comprehensive MRSA colonization surveillance program. It is not intended to diagnose MRSA infection nor to guide or monitor treatment for MRSA infections. Performed at [facility], [address] [place], [place]  [address]     Radiology Reports CT Angio Chest PE W and/or Wo Contrast  Result Date: 04/02/2020 CLINICAL DATA:  COVID exposure, dyspnea, cough, malaise, positive D-dimer EXAM: CT ANGIOGRAPHY CHEST WITH CONTRAST TECHNIQUE: Multidetector CT imaging of the chest was performed using the standard protocol during bolus administration of intravenous contrast. Multiplanar CT image reconstructions and MIPs were obtained to evaluate the vascular anatomy. CONTRAST:  OMNIPAQUE IOHEXOL 350 MG/ML SOLN COMPARISON:  None. FINDINGS: Cardiovascular: There is adequate opacification of the pulmonary arterial tree. There is no intraluminal filling defect identified to suggest acute pulmonary embolism. The central pulmonary arteries are of normal caliber. There is marked mediastinal shift to the right. Global cardiac size within normal limits. No significant coronary artery calcification. No pericardial effusion. The thoracic aorta is unremarkable. Mediastinum/Nodes: Thyroid unremarkable. Soft tissue within the anterior mediastinum likely represents rebound thymic or residual thymic tissue. The esophagus is unremarkable. No pathologic thoracic adenopathy is identified. Lungs/Pleura: There is a massive left pleural effusion which completely fills the left hemithorax, completely collapses the left lung, and demonstrates marked mass effect upon the mediastinum with marked mediastinal shift to the right. There is interstitial gas within a portion of the left lower lobe likely representing the lateral segment of the left lower lobe which may represent necrosis of the setting of necrotizing pneumonia. Mild patchy ground-glass infiltrate within the a right upper lobe anteriorly is nonspecific, possibly infectious or inflammatory. A bilobed fluid density structure is seen within the right middle lobe measuring 2.3 x 3.6 x 1.6 cm in greatest dimension, nonspecific, but possibly the sequela of prior infection or trauma. Upper Abdomen: No  acute abnormality. Musculoskeletal: No acute bone abnormality. Review of the MIP images confirms the above findings. IMPRESSION: No pulmonary embolism. Massive left pleural effusion, possibly representing a a parapneumonic effusion or empyema, demonstrating marked mass effect upon the mediastinum with marked left right mediastinal shift. Extensive interstitial gas within the probable lateral segment of the right lower lobe suggesting parenchymal necrosis in the setting of necrotizing pneumonia. Bilobed fluid-filled structure within the right middle lobe measuring 3.6 cm possibly representing the sequela of remote trauma or inflammation. Minimal patchy infiltrate within the right upper lobe, likely infectious or inflammatory. Electronically Signed   By: Helyn Numbers MD   On: 04/02/2020 04:18   DG Chest Port 1 View  Result Date: 04/05/2020 CLINICAL DATA:  Shortness of breath, COVID-19 positivity EXAM: PORTABLE CHEST 1 VIEW COMPARISON:  04/04/2020 FINDINGS: Cardiac shadow is obscured by the large left empyema. Right lung remains clear. Left hemithorax demonstrates opacification relatively similar to that seen on the prior exam. Pigtail catheter is noted in place. Slight improved aeration in the left upper lobe is noted. No  bony abnormality is seen. IMPRESSION: Slight improved aeration on the left. Pigtail catheter remains in place with relatively CT stable left empyema. Electronically Signed   By: Alcide Clever M.D.   On: 04/05/2020 09:04   DG Chest Port 1 View  Result Date: 04/04/2020 CLINICAL DATA:  Shortness of breath.  History of COVID. EXAM: PORTABLE CHEST 1 VIEW COMPARISON:  Chest radiograph 04/02/2020. FINDINGS: Monitoring leads overlie the patient. Cardiac and mediastinal contours largely obscured. Interval insertion left chest tube. Interval decrease in size of left pleural fluid within the upper hemithorax and mid hemithorax. Similar-appearing nodular opacity right mid lung. IMPRESSION: 1. Interval  insertion left chest tube with interval decrease in size of left pleural fluid. 2. Similar-appearing nodular opacity right mid lung. Electronically Signed   By: Annia Belt M.D.   On: 04/04/2020 10:46   DG Chest Portable 1 View  Result Date: 04/02/2020 CLINICAL DATA:  Large LEFT pleural effusion EXAM: PORTABLE CHEST 1 VIEW COMPARISON:  Portable exam at 1203 compared to 0227 hrs FINDINGS: Resolution of previously identified LEFT to RIGHT mediastinal shift. Unable to assess heart size due to subtotal opacification of the LEFT hemithorax. Minimal aeration now seen in LEFT lung. No pneumothorax. Nodular foci at inferior RIGHT lung again seen with minimal RIGHT basilar atelectasis. Osseous structures unremarkable. IMPRESSION: No pneumothorax following LEFT thoracentesis. Resolution of LEFT to RIGHT mediastinal shift. Persistent nodular foci at inferior RIGHT hemithorax. Electronically Signed   By: Ulyses Southward M.D.   On: 04/02/2020 12:32   DG Chest Port 1 View  Result Date: 04/02/2020 CLINICAL DATA:  COVID, shortness of breath EXAM: PORTABLE CHEST 1 VIEW COMPARISON:  None. FINDINGS: Complete whiteout of the left hemithorax, likely related to a combination of effusion and airspace disease. Heart and mediastinal structures are shifted to the right. Right mid lung patchy airspace disease. No effusion on the right. No acute bony abnormality. IMPRESSION: IMPRESSION Complete opacification of the left hemithorax, likely related to effusion and airspace disease. Patchy right mid lung airspace disease. Electronically Signed   By: Charlett Nose M.D.   On: 04/02/2020 02:48   CT [facility] PLEURAL DRAIN W/INDWELL CATH W/IMG GUIDE  Result Date: 04/03/2020 CLINICAL DATA:  Large left pleural empyema, COVID-19 infection and history of IV drug use. Request for percutaneous thoracostomy tube placement to drain the empyema. EXAM: CT GUIDED CATHETER DRAINAGE OF LEFT PLEURAL EMPYEMA ANESTHESIA/SEDATION: 2.0 mg IV Versed 100 mcg IV  Fentanyl Total Moderate Sedation Time:  10 minutes The patient's level of consciousness and physiologic status were continuously monitored during the procedure by Radiology nursing. PROCEDURE: The procedure, risks, benefits, and alternatives were explained to the patient. Questions regarding the procedure were encouraged and answered. The patient understands and consents to the procedure. A time out was performed prior to initiating the procedure. CT was performed through the chest in a supine position. The left chest wall was prepped with chlorhexidine in a sterile fashion, and a sterile drape was applied covering the operative field. A sterile gown and sterile gloves were used for the procedure. Local anesthesia was provided with 1% Lidocaine. Under CT guidance, an 18 gauge trocar needle was advanced into the left pleural space from an anterolateral approach. After confirming needle tip position, fluid was aspirated. A guidewire was advanced and the needle removed. The percutaneous tract was dilated and a 14 French pigtail drainage catheter advanced into the left pleural space. Catheter position was confirmed by CT. The pigtail catheter was connected to a Pleur-evac device. The catheter  was secured at the skin with a Prolene retention suture, StatLock device and dressed with a Vaseline gauze dressing. COMPLICATIONS: None FINDINGS: A large amount of residual left pleural fluid remains despite large volume thoracentesis yesterday. Aspiration from the pleural space yielded grossly purulent fluid. Repeat laboratory analysis was not performed as the fluid removed at the time of thoracentesis yesterday was sent for laboratory testing. After placement of the 14 French drainage catheter, there is good return of purulent fluid. The Pleur-evac will be connected to wall suction at -20 cm of water when the patient returns to her hospital room. IMPRESSION: CT-guided placement of 14 French left chest tube. A 14 French drainage  catheter was placed with return of purulent fluid. The drainage catheter was attached to a Pleur-evac device which will be attached to wall suction. Electronically Signed   By: Irish Lack M.D.   On: 04/03/2020 13:33   ECHOCARDIOGRAM LIMITED  Result Date: 04/03/2020    ECHOCARDIOGRAM LIMITED REPORT   Patient Name:   ASHYA Mecum Date of Exam: 04/03/2020 Medical Rec #:  [id number]     Height:       67.0 in Accession #:    [id number]    Weight:       110.7 lb Date of Birth:  1985-09-04     BSA:          1.573 m Patient Age:    34 years      BP:           109/79 mmHg Patient Gender: F             HR:           99 bpm. Exam Location:  Inpatient Procedure: Limited Echo, Limited Color Doppler and Cardiac Doppler Indications:    endocarditis  History:        Patient has no prior history of Echocardiogram examinations.                 Covid. Pleural effusion.; Risk Factors:Current Smoker and IV                 drug use.  Sonographer:    Delcie Roch Referring Phys: [id number] Alyssa EMOKPAE  Sonographer Comments: Image acquisition challenging due to respiratory motion. IMPRESSIONS  1. Left ventricular ejection fraction, by estimation, is 60 to 65%. The left ventricle has normal function.  2. Right ventricular systolic function is normal. The right ventricular size is normal.  3. A small pericardial effusion is present. The pericardial effusion is circumferential.  4. The mitral valve is normal in structure. No evidence of mitral valve regurgitation. No evidence of mitral stenosis.  5. The aortic valve was not well visualized. Aortic valve regurgitation is not visualized. No aortic stenosis is present.  6. The inferior vena cava is normal in size with greater than 50% respiratory variability, suggesting right atrial pressure of 3 mmHg. FINDINGS  Left Ventricle: Left ventricular ejection fraction, by estimation, is 60 to 65%. The left ventricle has normal function. Right Ventricle: The right ventricular size is  normal. Right vetricular wall thickness was not well visualized. Right ventricular systolic function is normal. Pericardium: A small pericardial effusion is present. The pericardial effusion is circumferential. Mitral Valve: The mitral valve is normal in structure. No evidence of mitral valve stenosis. Tricuspid Valve: The tricuspid valve is normal in structure. Tricuspid valve regurgitation is not demonstrated. No evidence of tricuspid stenosis. Aortic Valve: The aortic valve was not well visualized. Aortic valve regurgitation  is not visualized. No aortic stenosis is present. Pulmonic Valve: The pulmonic valve was normal in structure. Pulmonic valve regurgitation is not visualized. No evidence of pulmonic stenosis. Venous: The inferior vena cava is normal in size with greater than 50% respiratory variability, suggesting right atrial pressure of 3 mmHg. LEFT VENTRICLE PLAX 2D LVIDd:         3.90 cm LVIDs:         2.30 cm LV IVS:        0.70 cm LVOT diam:     1.60 cm LVOT Area:     2.01 cm  IVC IVC diam: 1.90 cm LEFT ATRIUM         Index LA diam:    2.10 cm 1.34 cm/m   AORTA Ao Root diam: 2.20 cm MITRAL VALVE MV Area (PHT): 5.23 cm    SHUNTS MV Decel Time: 145 msec    Systemic Diam: 1.60 cm MV E velocity: 88.70 cm/s MV A velocity: 61.70 cm/s MV E/A ratio:  1.44 Dina RichJonathan Branch MD Electronically signed by Dina RichJonathan Branch MD Signature Date/Time: 04/03/2020/2:31:28 PM    Final    US THORACENTESIS ASP PLEURAL SPACE W/IMG GUIDE  Result Date: 04/02/2020 INDICATION: Large LEFT pleural effusion EXAM: ULTRASOUND GUIDED DIAGNSOTIC AND THERAPEUTIC LEFT THORACENTESIS MEDICATIONS: None. COMPLICATIONS: None immediate. PROCEDURE: Procedure, benefits, and risks of procedure were discussed with patient. Written informed consent for procedure was obtained. Time out protocol followed. Pleural effusion localized by ultrasound at the posterior LEFT hemithorax. Fluid is markedly complex containing diffuse internal echogenicity as  well as dependent echogenicity. Skin prepped and draped in usual sterile fashion. Skin and soft tissues anesthetized with 10 mL of 1% lidocaine. 8 French thoracentesis catheter placed into the LEFT pleural space. 1.88 L of complex cloudy tan fluid with particulates with purulent appearance aspirated by syringe pump. Findings most likely represent empyema. Procedure tolerated well by patient without immediate complication. FINDINGS: Complex purulent appearing LEFT pleural fluid likely representing empyema. IMPRESSION: Successful ultrasound guided LEFT thoracentesis yielding 1.88 L of pleural fluid. Electronically Signed   By: Ulyses SouthwardMark  Boles M.D.   On: 04/02/2020 12:39    Lab Data:  CBC: Recent Labs  Lab 04/02/20 0158 04/03/20 0008 04/04/20 0415 04/05/20 0342  WBC 9.5 8.5 6.0 7.3  NEUTROABS 6.8 4.7 3.7 5.2  HGB 12.3 11.2* 12.3 10.6*  HCT 36.2 33.4* 38.7 32.9*  MCV 84.2 82.5 85.4 86.4  PLT 558* 584* 485* 584*   Basic Metabolic Panel: Recent Labs  Lab 04/02/20 0158 04/02/20 0728 04/02/20 2154 04/04/20 0415 04/05/20 0342  NA 125* 125* 135 136 139  K 3.9 4.3 3.9 4.0 4.4  CL 87* 87* 94* 101 106  CO2 28 29 26 25 25   GLUCOSE 115* 110* 96 121* 114*  BUN 15 16 21* 17 13  CREATININE 0.69 0.70 0.70 0.73 0.76  CALCIUM 8.5* 8.4* 8.3* 7.9* 7.5*  MG 1.8  --   --  1.8 1.7  PHOS 4.2  --   --  3.1 3.1   GFR: Estimated Creatinine Clearance: 80 mL/min (by C-G formula based on SCr of 0.76 mg/dL). Liver Function Tests: Recent Labs  Lab 04/02/20 0158 04/04/20 0415 04/05/20 0342  AST 50* 27 19  ALT 41 30 21  ALKPHOS 68 47 40  BILITOT 0.7 0.5 0.3  PROT 7.9 5.8* 5.2*  ALBUMIN 2.4* 1.7* 1.6*   No results for input(s): LIPASE, AMYLASE in the last 168 hours. No results for input(s): AMMONIA in the last 168 hours. Coagulation  Profile: No results for input(s): INR, PROTIME in the last 168 hours. Cardiac Enzymes: No results for input(s): CKTOTAL, CKMB, CKMBINDEX, TROPONINI in the last 168  hours. BNP (last 3 results) No results for input(s): PROBNP in the last 8760 hours. HbA1C: No results for input(s): HGBA1C in the last 72 hours. CBG: Recent Labs  Lab 04/03/20 2028 04/04/20 0756 04/04/20 1617 04/04/20 2009 04/05/20 0735  GLUCAP 115* 66* 91 93 100*   Lipid Profile: No results for input(s): CHOL, HDL, LDLCALC, TRIG, CHOLHDL, LDLDIRECT in the last 72 hours. Thyroid Function Tests: No results for input(s): TSH, T4TOTAL, FREET4, T3FREE, THYROIDAB in the last 72 hours. Anemia Panel: Recent Labs    04/04/20 0415 04/05/20 0342  FERRITIN 264 191   Urine analysis: No results found for: COLORURINE, APPEARANCEUR, LABSPEC, PHURINE, GLUCOSEU, HGBUR, BILIRUBINUR, KETONESUR, PROTEINUR, UROBILINOGEN, NITRITE, [place] Chalonda Schlatter M.D. [facility] 04/05/2020, 10:17 AM   Call night coverage person covering after 7pm

## 2020-04-05 NOTE — Progress Notes (Signed)
pt's ring placed inside specimen cup with pt label, inside plastic bag with pt label placed on out side of the bag. Bag with ring placed inside pt's belonging bag with pt's clothing

## 2020-04-05 NOTE — Progress Notes (Signed)
Pt arrived on the unit around 1500. A/O x4, c/o pain 10/10, RA. Chest tube set to suction per order. Site is clean, dry. Semisenn fluid about 50 ml since Post OP. Xray is done, results were shown to MD at the bedside. Pain managements in orders.  Belongings (clothes, snacks and phone) at the bedside. Upper dentures are in the mouth. Education provides, emotional support was given.  Bed in low position, alarms are on, call bell in reach, continue to monitor.

## 2020-04-05 NOTE — Progress Notes (Signed)
      [address]       [place] [address]             [phone]      Pre Procedure note for inpatients:   Alyssa Vargas has been scheduled for Procedure(s): VIDEO ASSISTED THORACOSCOPY (VATS)/EMPYEMA (Left) today. The various methods of treatment have been discussed with the patient. After consideration of the risks, benefits and treatment options the patient has consented to the planned procedure.   The patient has been seen and labs reviewed. There are no changes in the patient's condition to prevent proceeding with the planned procedure today.  Recent labs:  Lab Results  Component Value Date   WBC 7.3 04/05/2020   HGB 10.6 (L) 04/05/2020   HCT 32.9 (L) 04/05/2020   PLT 584 (H) 04/05/2020   GLUCOSE 114 (H) 04/05/2020   ALT 21 04/05/2020   AST 19 04/05/2020   NA 139 04/05/2020   K 4.4 04/05/2020   CL 106 04/05/2020   CREATININE 0.76 04/05/2020   BUN 13 04/05/2020   CO2 25 04/05/2020   Small amount   drainage overnight from chest tube  Chest xray no improvement today - still retained pus in left  Have discussed with patient and her mother proceeding with left VATS for drainage , obvious active covid infection increases the risk of procedure and general anaesthetic  The goals risks and alternatives of the planned surgical procedure Procedure(s): VIDEO ASSISTED THORACOSCOPY (VATS)/EMPYEMA (Left)  have been discussed with the patient in detail. The risks of the procedure including death, infection, stroke, myocardial infarction, bleeding, blood transfusion have all been discussed specifically.  I have quoted Bennie Pierini a 4 % of perioperative mortality and a complication rate as high as 40 %. The patient's questions have been answered.[name] is willing  to proceed with the planned procedure.   patient mother Waymond Cera [phone]  Delight Ovens, MD 04/05/2020 10:00 AM

## 2020-04-05 NOTE — Brief Op Note (Addendum)
      [address]       [place] [address]             [phone]    04/05/2020  12:52 PM  PATIENT:  Alyssa Vargas  34 y.o. female  PRE-OPERATIVE DIAGNOSIS:  Left Empyema  POST-OPERATIVE DIAGNOSIS:  Left Empyema  PROCEDURE: LEFT VIDEO ASSISTED THORACOSCOPY (VATS),EMPYEMA DRAINAGE, DECORTICATION    SURGEON:  Surgeon(s) and Role:    Delight Ovens, MD - Primary  PHYSICIAN ASSISTANT: Doree Fudge PA-C  ANESTHESIA:   general  EBL:  100 mL   BLOOD ADMINISTERED:none  DRAINS: 28 Blake drain and 28 French chest tube placed in the left pleural space   LOCAL MEDICATIONS USED:  OTHER Exparel  COUNTS CORRECT:  YES  DICTATION: .Dragon Dictation  PLAN OF CARE: Admit to inpatient   PATIENT DISPOSITION:  ICU - intubated and hemodynamically stable.   Delay start of Pharmacological VTE agent (>24hrs) due to surgical blood loss or risk of bleeding: No  Finding- 1259 ml pus drained and decorticated

## 2020-04-05 NOTE — Anesthesia Preprocedure Evaluation (Addendum)
Anesthesia Evaluation  Patient identified by MRN, date of birth, ID band Patient awake    Reviewed: Allergy & Precautions, H&P , NPO status , Patient's Chart, lab work & pertinent test results  Airway Mallampati: I  TM Distance: >3 FB Neck ROM: Full    Dental no notable dental hx. (+) Edentulous Upper, Partial Lower, Dental Advisory Given   Pulmonary Current Smoker and Patient abstained from smoking.,  COVID + Pleural Effusion Empyema   Pulmonary exam normal breath sounds clear to auscultation       Cardiovascular negative cardio ROS   Rhythm:Regular Rate:Normal     Neuro/Psych negative neurological ROS  negative psych ROS   GI/Hepatic negative GI ROS, Neg liver ROS,   Endo/Other  negative endocrine ROS  Renal/GU negative Renal ROS  negative genitourinary   Musculoskeletal   Abdominal   Peds  Hematology negative hematology ROS (+)   Anesthesia Other Findings   Reproductive/Obstetrics negative OB ROS                            Anesthesia Physical Anesthesia Plan  ASA: III  Anesthesia Plan: General   Post-op Pain Management:    Induction: Intravenous  PONV Risk Score and Plan: 3 and Ondansetron, Dexamethasone and Midazolam  Airway Management Planned: Double Lumen EBT  Additional Equipment: Arterial line and CVP  Intra-op Plan:   Post-operative Plan: Extubation in OR and Possible Post-op intubation/ventilation  Informed Consent: I have reviewed the patients History and Physical, chart, labs and discussed the procedure including the risks, benefits and alternatives for the proposed anesthesia with the patient or authorized representative who has indicated his/her understanding and acceptance.     Dental advisory given  Plan Discussed with: CRNA  Anesthesia Plan Comments:        Anesthesia Quick Evaluation

## 2020-04-05 NOTE — Anesthesia Postprocedure Evaluation (Signed)
Anesthesia Post Note  Patient: Alyssa Vargas  Procedure(s) Performed: VIDEO ASSISTED THORACOSCOPY (VATS)/EMPYEMA DRAINAGE, DECORTICATION  (Left Chest)     Patient location during evaluation: PACU Anesthesia Type: General Level of consciousness: awake and alert Pain management: pain level controlled Vital Signs Assessment: post-procedure vital signs reviewed and stable Respiratory status: spontaneous breathing, nonlabored ventilation and respiratory function stable Cardiovascular status: blood pressure returned to baseline and stable Postop Assessment: no apparent nausea or vomiting Anesthetic complications: no   No complications documented.  Last Vitals:  Vitals:   04/05/20 1325 04/05/20 1340  BP: 113/84 (!) 114/92  Pulse: 76 73  Resp: 16 16  Temp: (!) 35.3 C (!) 35.2 C  SpO2: 100% 97%    Last Pain:  Vitals:   04/05/20 1340  TempSrc:   PainSc: Asleep                 Izaah Westman,W. EDMOND

## 2020-04-05 NOTE — Consult Note (Signed)
NAME:  Alyssa Vargas, MRN:  [id number], DOB:  [birth year], LOS: 3 ADMISSION DATE:  04/01/2020, CONSULTATION DATE:  12/17 REFERRING MD:  Dondra Prader , CHIEF COMPLAINT:  Massive L effusion in covid Pos pt    Brief History:  34 y/o F,smoker, admitted 12/15 with unclear duration of hx of SOB. 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  Additional findings of COVID positive, weight loss of 18lbs in last month.  Prior IVDA. The patient was transferred to [facility] for TCTS evaluation.  She underwent VATS with decortication and empyema drainage on 12/20 with 28Fr chest tube placement.  She returned to ICU post VATS.    Past Medical History:  IVDA  Tobacco Abuse  Significant Hospital Events:  12/16 Admit  12/17 PCCM consulted  12/20 To OR for VATS decortication, empyema drainage & chest tube placement   Consults:  PCCM  12/17  Procedures:  Left Chest Tube x2 12/20 >>   Significant Diagnostic Tests:  CT Chest pre-thora 12/17 >> massive left pleural effusion which completely fills the left hemithorax, completely collapses the left lung, and demonstrates marked mass effect upon the mediastinum with marked mediastinal shift to the right. There is interstitial gas within a portion of the left lower lobe likely representing the lateral segment of the left lower lobe which may represent necrosis of the setting of necrotizing pneumonia. Mild patchy ground-glass infiltrate within the a right upper lobe anteriorly is nonspecific, possibly infectious or inflammatory. A bilobed fluid density structure is seen within the right middle lobe measuring 2.3 x 3.6 x1.6 cm in greatest dimension, nonspecific, but possibly the sequelaof prior infection or trauma. L Thoracentesis 12/17 >> of p purulent fluid c/w empyema with glucose 18  ECHO 12/18 >> LVEF 60-65%, RV systolic function normal, small pericardial effusion  Micro Data:  COVID 12/16 >> Positive  Influenza A/B 12/16 >> negative   BCx2 12/17 >>  Pleural Fluid Gram Stain 12/17 >> gram positive cocci  MRSA PCR 12/18 >> negative  Pleural Fluid Culture 12/17 >> streptococcus pneumoniae >> pan sensitive   Antimicrobials:  Remdesivir 12/17 >>  Zosyn 12/17 >> 12/20 Vanc 12/17 >> 12/20 Ceftriaxone 12/20 >>   Interim History / Subjective:  S/P VATS, returned to ICU post-op  Afebrile  On 6L [place]  I/O 500 ml UOP  Objective   Blood pressure 122/90, pulse 69, temperature (!) 95.9 F (35.5 C), resp. rate 16, height 5\' 7"  (1.702 m), weight 50.2 kg, SpO2 99 %.        Intake/Output Summary (Last 24 hours) at 04/05/2020 1413 Last data filed at 04/05/2020 1340 Gross per 24 hour  Intake 2580 ml  Output 2020 ml  Net 560 ml   Filed Weights   04/01/20 2125 04/02/20 2220  Weight: 52.2 kg 50.2 kg    Exam: General: young adult female lying in bed in NAD HEENT: MM pink/moist, mask in place, anicteric, tearful  Neuro: AAOx4, speech clear, MAE  CV: s1s2 RRR, no m/r/g PULM: non-labored on RA, clear on right, slight rub on left, chest tubes x2 on left  GI: soft, bsx4 active  Extremities: warm/dry, no edema  Skin: no rashes or lesions   Resolved Hospital Problem list      Assessment & Plan:   Left Necrotizing PNA with Strep Pneumoniae Empyema  Admitted with tension empyema s/p thoracentesis at [facility].  Subsequently transferred to [facility] for TCTS evaluation.   -appreciate TCTS evaluation  -follow intermittent CXR -chest tube  management per TCTS  -abx as above  -will need abx for  -PRN oxycodone, dilaudid for pain  COVID PNA   Without respiratory failure thus far, tolerated VATS -wean O2 for sats >85% at rest, >75% with exertion -pulmonary hygiene - IS -mobilize  -follow intermittent CXR  -consider rapid wean of steroids  -remdesivir per protocol   Hyponatremia   Question SIADH vs combination of poor intake -follow Na trend  -gentle IVF  -encourage PO intake   Tobacco Abuse  -smoking cessation counseling    Hx IVDA  -pain control as above     Best practice (evaluated daily)  Diet: per [facility] Pain/Anxiety/Delirium protocol (if indicated): per tir[facility]  VAP protocol (if indicated):  DVT prophylaxis: per tri[facility] GI prophylaxis: per triad Glucose control: per triad Mobility: as tolerated  Disposition: per TRH  Labs   CBC: Recent Labs  Lab 04/02/20 0158 04/03/20 0008 04/04/20 0415 04/05/20 0342  WBC 9.5 8.5 6.0 7.3  NEUTROABS 6.8 4.7 3.7 5.2  HGB 12.3 11.2* 12.3 10.6*  HCT 36.2 33.4* 38.7 32.9*  MCV 84.2 82.5 85.4 86.4  PLT 558* 584* 485* 584*    Basic Metabolic Panel: Recent Labs  Lab 04/02/20 0158 04/02/20 0728 04/02/20 2154 04/03/20 0008 04/04/20 0415 04/05/20 0342  NA 125* 125* 135 133* 136 139  K 3.9 4.3 3.9 3.6 4.0 4.4  CL 87* 87* 94* 96* 101 106  CO2 28 29 26 26 25 25   GLUCOSE 115* 110* 96 107* 121* 114*  BUN 15 16 21* 23* 17 13  CREATININE 0.69 0.70 0.70 0.69 0.73 0.76  CALCIUM 8.5* 8.4* 8.3* 8.0* 7.9* 7.5*  MG 1.8  --   --  1.9 1.8 1.7  PHOS 4.2  --   --  4.5 3.1 3.1   GFR: Estimated Creatinine Clearance: 80 mL/min (by C-G formula based on SCr of 0.76 mg/dL). Recent Labs  Lab 04/02/20 0158 04/03/20 0008 04/04/20 0415 04/05/20 0342  WBC 9.5 8.5 6.0 7.3    Liver Function Tests: Recent Labs  Lab 04/02/20 0158 04/03/20 0008 04/04/20 0415 04/05/20 0342  AST 50* 43* 27 19  ALT 41 38 30 21  ALKPHOS 68 56 47 40  BILITOT 0.7 0.5 0.5 0.3  PROT 7.9 5.7* 5.8* 5.2*  ALBUMIN 2.4* 1.7* 1.7* 1.6*   No results for input(s): LIPASE, AMYLASE in the last 168 hours. No results for input(s): AMMONIA in the last 168 hours.  ABG No results found for: PHART, PCO2ART, PO2ART, HCO3, TCO2, ACIDBASEDEF, O2SAT   Coagulation Profile: Recent Labs  Lab 04/03/20 0008  INR 1.1    Cardiac Enzymes: No results for input(s): CKTOTAL, CKMB, CKMBINDEX, TROPONINI in the last 168 hours.  HbA1C: No results found for: HGBA1C  CBG: Recent Labs  Lab 04/03/20 2028  04/04/20 0756 04/04/20 1617 04/04/20 2009 04/05/20 0735  GLUCAP 115* 66* 91 93 100*        04/07/20, MSN, NP-C, AGACNP-BC [facility] 04/05/2020, 3:30 PM   Please see [email or web address] for pager details.

## 2020-04-05 NOTE — Progress Notes (Signed)
Initial Nutrition Assessment  DOCUMENTATION CODES:   Underweight  INTERVENTION:   -Once diet is advanced:  -MVI with minerals daily -Magic cup TID with meals, each supplement provides 290 kcal and 9 grams of protein -Increase Ensure Enlive po to TID, each supplement provides 350 kcal and 20 grams of protein  NUTRITION DIAGNOSIS:   Increased nutrient needs related to acute illness (COVID-19) as evidenced by estimated needs.  GOAL:   Patient will meet greater than or equal to 90% of their needs  MONITOR:   PO intake,Supplement acceptance,Diet advancement,Labs,Weight trends,Skin,I & O's  REASON FOR ASSESSMENT:   Malnutrition Screening Tool    ASSESSMENT:   Alyssa Vargas is a 34 y.o. female with medical history significant for tobacco abuse who presents to the emergency department due to 1 week of worsening shortness of breath associated with cough, loss of sense of taste with decreased appetite and generalized body aches. Patient states that she was exposed to someone with Covid, she complained of chills and sweats, but was unsure if she had fever. She states that she has lost about 18 pounds within 1 month and states that this was due to being depressed prior to onset of current symptoms. Last smoking of cigarettes was yesterday. She denies any thoughts to harm herself or anyone.  Pt admitted with lt pleural effusion in the setting of acute respiratory failure with hypoxia due to COVID-19 virus infection.   12/17- s/p lt thoracentesis (1.88 L fluid removed) 12/18- s/p lt chest tube placement  Reviewed I/O's: +410 ml x 24 hours and +6.4 L since admission  Chest tube output: 70 ml x 24 hours  Attempted to speak with pt via call to hospital room phone, however, unable to reach.   Per CVTS notes, if no improvement with chest x-ray, but may require lt VATS.   Pt currently NPO. Noted she was previously on a regular diet with documented meal completion 50%.   Reviewed wt hx;  pt has experienced a 13.6% wt loss over the past 10 months. Per H&P, pt reports an 18 pound weight loss over the past month, however, no data to confirm this. She also reports decreased appetite over the past month secondary to depression.   RD highly suspects malnutrition given underweight status and weight loss, however, RD unable to identify at this time. Pt would greatly benefit from addition of oral nutrition supplements once diet is advanced.   Medications reviewed and include remdesivir.   Labs reviewed: CBGS" 91-100 (inpatient orders for glycemic control are 0-9 units insulin aspart TID with meals and 0-5 units insulin aspart daily at bedtime).   Diet Order:   Diet Order            Diet NPO time specified  Diet effective midnight                 EDUCATION NEEDS:   No education needs have been identified at this time  Skin:  Skin Assessment: Reviewed RN Assessment  Last BM:  04/04/20  Height:   Ht Readings from Last 1 Encounters:  04/02/20 5\' 7"  (1.702 m)    Weight:   Wt Readings from Last 1 Encounters:  04/02/20 50.2 kg    Ideal Body Weight:  61.4 kg  BMI:  Body mass index is 17.33 kg/m.  Estimated Nutritional Needs:   Kcal:  2000-2200  Protein:  110-125 grams  Fluid:  > 2 L    04/04/20, RD, LDN, CDCES Registered Dietitian II Certified Diabetes  Care and Education Specialist Please refer to [facility] for RD and/or RD on-call/weekend/after hours pager

## 2020-04-05 NOTE — Progress Notes (Signed)
EVENING ROUNDS NOTE :     [address]       [place] [address]             [phone]                 Day of Surgery Procedure(s) (LRB): VIDEO ASSISTED THORACOSCOPY (VATS)/EMPYEMA DRAINAGE, DECORTICATION  (Left)   Total Length of Stay:  LOS: 3 days  Events:   Continue pulm toilet Continue CT to suction    BP 116/89 (BP Location: Left Arm)   Pulse 76   Temp (!) 96.3 F (35.7 C)   Resp 18   Ht 5\' 7"  (1.702 m)   Wt 50.2 kg   SpO2 100%   BMI 17.33 kg/m      FiO2 (%):  [21 %] 21 %  . sodium chloride    . sodium chloride    . remdesivir 100 mg in NS 100 mL 100 mg (04/05/20 0950)    I/O last 3 completed shifts: In: 1200 [P.O.:1200] Out: 120 [Chest Tube:120]   CBC Latest Ref Rng & Units 04/05/2020 04/04/2020 04/03/2020  WBC 4.0 - 10.5 K/uL 7.3 6.0 8.5  Hemoglobin 12.0 - 15.0 g/dL 10.6(L) 12.3 11.2(L)  Hematocrit 36.0 - 46.0 % 32.9(L) 38.7 33.4(L)  Platelets 150 - 400 K/uL 584(H) 485(H) 584(H)    BMP Latest Ref Rng & Units 04/05/2020 04/04/2020 04/03/2020  Glucose 70 - 99 mg/dL 04/05/2020) 450(T) 888(K)  BUN 6 - 20 mg/dL 13 17 800(L)  Creatinine 0.44 - 1.00 mg/dL 49(Z 7.91 5.05  Sodium 135 - 145 mmol/L 139 136 133(L)  Potassium 3.5 - 5.1 mmol/L 4.4 4.0 3.6  Chloride 98 - 111 mmol/L 106 101 96(L)  CO2 22 - 32 mmol/L 25 25 26   Calcium 8.9 - 10.3 mg/dL 7.5(L) 7.9(L) 8.0(L)    ABG No results found for: PHART, PCO2ART, PO2ART, HCO3, TCO2, ACIDBASEDEF, O2SAT     6.97, MD 04/05/2020 3:13 PM

## 2020-04-05 NOTE — Transfer of Care (Signed)
Immediate Anesthesia Transfer of Care Note  Patient: Alyssa Vargas  Procedure(s) Performed: VIDEO ASSISTED THORACOSCOPY (VATS)/EMPYEMA DRAINAGE, DECORTICATION  (Left Chest)  Patient Location: PACU and PACU nurse to OR 11 to recover patient  Anesthesia Type:General  Level of Consciousness: awake and drowsy  Airway & Oxygen Therapy: Patient Spontanous Breathing and Patient connected to face mask oxygen  Post-op Assessment: Report given to RN and Post -op Vital signs reviewed and stable  Post vital signs: Reviewed and stable  Last Vitals:  Vitals Value Taken Time  BP    Temp    Pulse    Resp    SpO2      Last Pain:  Vitals:   04/05/20 0934  TempSrc:   PainSc: 5       Patients Stated Pain Goal: 4 (04/05/20 0831)  Complications: No complications documented.

## 2020-04-05 NOTE — Anesthesia Procedure Notes (Signed)
Procedure Name: Intubation Date/Time: 04/05/2020 10:51 AM Performed by: Trinna Post., CRNA Pre-anesthesia Checklist: Patient identified, Emergency Drugs available, Suction available, Patient being monitored and Timeout performed Patient Re-evaluated:Patient Re-evaluated prior to induction Oxygen Delivery Method: Circle system utilized Preoxygenation: Pre-oxygenation with 100% oxygen Induction Type: IV induction, Rapid sequence and Cricoid Pressure applied Laryngoscope Size: Mac and 3 Grade View: Grade I Endobronchial tube: Left and Double lumen EBT and 37 Fr Number of attempts: 1 Airway Equipment and Method: Stylet Placement Confirmation: ETT inserted through vocal cords under direct vision,  positive ETCO2 and breath sounds checked- equal and bilateral Tube secured with: Tape Dental Injury: Teeth and Oropharynx as per pre-operative assessment

## 2020-04-05 NOTE — Anesthesia Procedure Notes (Signed)
Central Venous Catheter Insertion Performed by: Gaynelle Adu, MD, anesthesiologist Start/End12/20/2021 10:52 AM, 04/05/2020 11:02 AM Patient location: Pre-op. Preanesthetic checklist: patient identified, IV checked, site marked, risks and benefits discussed, surgical consent, monitors and equipment checked, pre-op evaluation, timeout performed and anesthesia consent Position: Trendelenburg Lidocaine 1% used for infiltration and patient sedated Hand hygiene performed , maximum sterile barriers used  and Seldinger technique used Catheter size: 8 Fr Total catheter length 16. Central line was placed.Double lumen Procedure performed without using ultrasound guided technique. Attempts: 1 Following insertion, dressing applied, line sutured and Biopatch. Post procedure assessment: blood return through all ports  Patient tolerated the procedure well with no immediate complications.

## 2020-04-05 NOTE — Anesthesia Procedure Notes (Signed)
Arterial Line Insertion Start/End12/20/2021 10:55 AM Performed by: Alease Medina, CRNA, CRNA  Patient location: OR. Preanesthetic checklist: patient identified, IV checked, site marked, risks and benefits discussed, surgical consent, monitors and equipment checked, pre-op evaluation, timeout performed and anesthesia consent Patient sedated Right, radial was placed Catheter size: 20 G Hand hygiene performed  and maximum sterile barriers used   Attempts: 1 Procedure performed without using ultrasound guided technique. Following insertion, dressing applied and Biopatch. Post procedure assessment: normal  Patient tolerated the procedure well with no immediate complications.

## 2020-04-06 ENCOUNTER — Inpatient Hospital Stay (HOSPITAL_COMMUNITY): Payer: Medicaid Other

## 2020-04-06 ENCOUNTER — Encounter (HOSPITAL_COMMUNITY): Payer: Self-pay | Admitting: Cardiothoracic Surgery

## 2020-04-06 DIAGNOSIS — U071 COVID-19: Secondary | ICD-10-CM

## 2020-04-06 LAB — CBC
HCT: 32.3 % — ABNORMAL LOW (ref 36.0–46.0)
Hemoglobin: 10.7 g/dL — ABNORMAL LOW (ref 12.0–15.0)
MCH: 28.2 pg (ref 26.0–34.0)
MCHC: 33.1 g/dL (ref 30.0–36.0)
MCV: 85.2 fL (ref 80.0–100.0)
Platelets: 585 10*3/uL — ABNORMAL HIGH (ref 150–400)
RBC: 3.79 MIL/uL — ABNORMAL LOW (ref 3.87–5.11)
RDW: 13.8 % (ref 11.5–15.5)
WBC: 14.1 10*3/uL — ABNORMAL HIGH (ref 4.0–10.5)
nRBC: 0 % (ref 0.0–0.2)

## 2020-04-06 LAB — BASIC METABOLIC PANEL
Anion gap: 7 (ref 5–15)
BUN: 13 mg/dL (ref 6–20)
CO2: 26 mmol/L (ref 22–32)
Calcium: 7.7 mg/dL — ABNORMAL LOW (ref 8.9–10.3)
Chloride: 103 mmol/L (ref 98–111)
Creatinine, Ser: 0.6 mg/dL (ref 0.44–1.00)
GFR, Estimated: >60 mL/min (ref >60)
Glucose, Bld: 71 mg/dL (ref 70–99)
Potassium: 4 mmol/L (ref 3.5–5.1)
Sodium: 136 mmol/L (ref 135–145)

## 2020-04-06 LAB — GLUCOSE, CAPILLARY
Glucose-Capillary: 146 mg/dL — ABNORMAL HIGH (ref 70–99)
Glucose-Capillary: 72 mg/dL (ref 70–99)
Glucose-Capillary: 98 mg/dL (ref 70–99)
Glucose-Capillary: 134 mg/dL — ABNORMAL HIGH (ref 70–99)
Glucose-Capillary: 63 mg/dL — ABNORMAL LOW (ref 70–99)
Glucose-Capillary: 89 mg/dL (ref 70–99)

## 2020-04-06 MED ORDER — CEFTRIAXONE SODIUM 2 G IJ SOLR
2.0000 g | INTRAMUSCULAR | Status: AC
Start: 2020-04-06 — End: 2020-04-15
  Administered 2020-04-06 – 2020-04-15 (×10): 2 g via INTRAVENOUS
  Filled 2020-04-06 (×6): qty 20
  Filled 2020-04-06: qty 2
  Filled 2020-04-06 (×2): qty 20
  Filled 2020-04-06: qty 2

## 2020-04-06 MED ORDER — LIDOCAINE 5 % EX PTCH
1.0000 | MEDICATED_PATCH | CUTANEOUS | Status: DC
  Administered 2020-04-06 – 2020-04-29 (×7): 1 via TRANSDERMAL
  Filled 2020-04-06 (×20): qty 1

## 2020-04-06 MED ORDER — KETOROLAC TROMETHAMINE 15 MG/ML IJ SOLN
15.0000 mg | Freq: Once | INTRAMUSCULAR | Status: AC
  Administered 2020-04-06: 15 mg via INTRAVENOUS
  Filled 2020-04-06: qty 1

## 2020-04-06 MED ORDER — KETOROLAC TROMETHAMINE 15 MG/ML IJ SOLN
15.0000 mg | Freq: Four times a day (QID) | INTRAMUSCULAR | Status: AC | PRN
  Administered 2020-04-06 – 2020-04-09 (×9): 15 mg via INTRAVENOUS
  Filled 2020-04-06 (×10): qty 1

## 2020-04-06 NOTE — Consult Note (Addendum)
NAME:  Alyssa Vargas, MRN:  [id number], DOB:  [birth year], LOS: 4 ADMISSION DATE:  04/01/2020, CONSULTATION DATE:  12/17 REFERRING MD:  Dondra Prader , CHIEF COMPLAINT:  Massive L effusion in covid Pos pt    Brief History:  34 y/o F,smoker, admitted 12/15 with unclear duration of hx of SOB. 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  Additional findings of COVID positive, weight loss of 18lbs in last month.  Prior IVDA. The patient was transferred to [facility] for TCTS evaluation.  She underwent VATS with decortication and empyema drainage on 12/20 with 28Fr chest tube placement.  She returned to ICU post VATS.    Past Medical History:  IVDA  Tobacco Abuse  Significant Hospital Events:  12/16 Admit  12/17 PCCM consulted  12/20 To OR for VATS decortication, empyema drainage & chest tube placement   Consults:  PCCM  12/17    Procedures:  LEFT VIDEO ASSISTED THORACOSCOPY (VATS),EMPYEMA DRAINAGE, DECORTICATION  12/20 Left Chest Tube x2 12/20 >>   Significant Diagnostic Tests:  CT Chest pre-thora 12/17 >> massive left pleural effusion which completely fills the left hemithorax, completely collapses the left lung, and demonstrates marked mass effect upon the mediastinum with marked mediastinal shift to the right. There is interstitial gas within a portion of the left lower lobe likely representing the lateral segment of the left lower lobe which may represent necrosis of the setting of necrotizing pneumonia. Mild patchy ground-glass infiltrate within the a right upper lobe anteriorly is nonspecific, possibly infectious or inflammatory. A bilobed fluid density structure is seen within the right middle lobe measuring 2.3 x 3.6 x1.6 cm in greatest dimension, nonspecific, but possibly the sequelaof prior infection or trauma. L Thoracentesis 12/17 >> of p purulent fluid c/w empyema with glucose 18  ECHO 12/18 >> LVEF 60-65%, RV systolic function normal, small pericardial  effusion  Micro Data:  COVID 12/16 >> Positive  Influenza A/B 12/16 >> negative  BCx2 12/17 >>  Pleural Fluid Gram Stain 12/17 >> gram positive cocci  MRSA PCR 12/18 >> negative  Pleural Fluid Culture 12/17 >> streptococcus pneumoniae >> pan sensitive   Antimicrobials:  Remdesivir 12/17 >>  Zosyn 12/17 >> 12/20 Vanc 12/17 >> 12/20 Ceftriaxone 12/20 >>   Interim History / Subjective:  S/P VATS, 12/20, CT x 2 on Left Afebrile T max 97.8  On RA with sats of 100% + 7.3 L, 1000 cc's UO last 24 hours Creatinine 0.6 HGB 10.7, WBC 14.1 PLTS 585 Suction was not properly attached to [place] device overnight which may explain interval increase in L sided pneumothorax>> Now corrected. Total of 220 cc's CT drainage last 24 hours Persistent L lung atelectasis/ infiltrate Persistent density noted in the RLL  ? fluid pseudotumor and or atelectasis  Objective   Blood pressure 120/85, pulse 71, temperature 97.88 F (36.6 C), resp. rate (!) 21, height 5\' 7"  (1.702 m), weight 50.2 kg, SpO2 100 %.    FiO2 (%):  [21 %] 21 %   Intake/Output Summary (Last 24 hours) at 04/06/2020 0855 Last data filed at 04/06/2020 0800 Gross per 24 hour  Intake 3583.14 ml  Output 2655 ml  Net 928.14 ml   Filed Weights   04/01/20 2125 04/02/20 2220  Weight: 52.2 kg 50.2 kg    Exam: General: young adult female lying in bed in NAD, on RA HEENT: MM pink/moist, anicteric, No LAD Neuro: AAOx4, speech clear, MAE x 4 CV: s1s2 RRR, no m/r/g PULM:  Bilateral chest excursion, non-labored on RA, BS clear on right, Rub  rub on left, minimal air movement, chest tubes x2 on left with bloody drainage GI: soft, bsx4 active  Extremities: warm/dry, no edema , no obvious deformities Skin: no rashes or lesions   Resolved Hospital Problem list      Assessment & Plan:   Left Necrotizing PNA with Strep Pneumoniae Empyema  Admitted with tension empyema s/p thoracentesis at [facility].  Subsequently transferred to [facility] for TCTS  evaluation and eventual VATS decortication with empyema drainage 12/20 .   -appreciate TCTS evaluation  -follow intermittent CXR -chest tube management per TCTS  - Monitor CT output -abx as above >> Rocephin per sensitivities -PRN oxycodone, dilaudid for pain  COVID PNA   Without respiratory failure thus far, tolerated VATS -wean O2 for sats >85% at rest, >75% with exertion -pulmonary hygiene - IS -mobilize  -follow intermittent CXR in am and prn -consider rapid wean of steroids  -remdesivir per protocol   Hyponatremia   Question SIADH vs combination of poor intake -follow Na trend  - Monitor UO -gentle IVF  -encourage PO intake   Tobacco Abuse  -smoking cessation counseling   Hx IVDA  -pain control as above   Nutrition Recent weight loss of 18 pounds Plan Consult to dietitian for nutritional assessment and calorie count    Best practice (evaluated daily)  Diet: per triad Pain/Anxiety/Delirium protocol (if indicated): per tirad  VAP protocol (if indicated):  DVT prophylaxis: per triad GI prophylaxis: per triad Glucose control: per triad Mobility: as tolerated  Disposition: per TRH  Labs   CBC: Recent Labs  Lab 04/02/20 0158 04/03/20 0008 04/04/20 0415 04/05/20 0342 04/06/20 0518  WBC 9.5 8.5 6.0 7.3 14.1*  NEUTROABS 6.8 4.7 3.7 5.2  --   HGB 12.3 11.2* 12.3 10.6* 10.7*  HCT 36.2 33.4* 38.7 32.9* 32.3*  MCV 84.2 82.5 85.4 86.4 85.2  PLT 558* 584* 485* 584* 585*    Basic Metabolic Panel: Recent Labs  Lab 04/02/20 0158 04/02/20 0728 04/02/20 2154 04/03/20 0008 04/04/20 0415 04/05/20 0342 04/06/20 0518  NA 125*   < > 135 133* 136 139 136  K 3.9   < > 3.9 3.6 4.0 4.4 4.0  CL 87*   < > 94* 96* 101 106 103  CO2 28   < > 26 26 25 25 26   GLUCOSE 115*   < > 96 107* 121* 114* 71  BUN 15   < > 21* 23* 17 13 13   CREATININE 0.69   < > 0.70 0.69 0.73 0.76 0.60  CALCIUM 8.5*   < > 8.3* 8.0* 7.9* 7.5* 7.7*  MG 1.8  --   --  1.9 1.8 1.7  --   PHOS 4.2   --   --  4.5 3.1 3.1  --    < > = values in this interval not displayed.   GFR: Estimated Creatinine Clearance: 80 mL/min (by C-G formula based on SCr of 0.6 mg/dL). Recent Labs  Lab 04/03/20 0008 04/04/20 0415 04/05/20 0342 04/06/20 0518  WBC 8.5 6.0 7.3 14.1*    Liver Function Tests: Recent Labs  Lab 04/02/20 0158 04/03/20 0008 04/04/20 0415 04/05/20 0342  AST 50* 43* 27 19  ALT 41 38 30 21  ALKPHOS 68 56 47 40  BILITOT 0.7 0.5 0.5 0.3  PROT 7.9 5.7* 5.8* 5.2*  ALBUMIN 2.4* 1.7* 1.7* 1.6*   No results for input(s): LIPASE, AMYLASE in the last 168 hours. No  results for input(s): AMMONIA in the last 168 hours.  ABG No results found for: PHART, PCO2ART, PO2ART, HCO3, TCO2, ACIDBASEDEF, O2SAT   Coagulation Profile: Recent Labs  Lab 04/03/20 0008  INR 1.1    Cardiac Enzymes: No results for input(s): CKTOTAL, CKMB, CKMBINDEX, TROPONINI in the last 168 hours.  HbA1C: No results found for: HGBA1C  CBG: Recent Labs  Lab 04/04/20 0756 04/04/20 1617 04/04/20 2009 04/05/20 0735 04/05/20 1617  GLUCAP 66* 91 93 100* 158*         Bevelyn Ngo, MSN, AGACNP-BC [place] [facility] See Amion for personal pager PCCM on call pager [phone] 04/06/2020, 8:55 AM

## 2020-04-06 NOTE — Progress Notes (Signed)
EVENING ROUNDS NOTE :     [address]       [place] [address]             [phone]                 1 Day Post-Op Procedure(s) (LRB): VIDEO ASSISTED THORACOSCOPY (VATS)/EMPYEMA DRAINAGE, DECORTICATION  (Left)  Total Length of Stay:  LOS: 4 days  BP 114/83   Pulse 82   Temp 98.42 F (36.9 C)   Resp 19   Ht 5\' 7"  (1.702 m)   Wt 50.2 kg   SpO2 96%   BMI 17.33 kg/m   .Intake/Output      12/20 0701 12/21 0700 12/21 0701 12/22 0700   P.O. 0    I.V. (mL/kg) 1796.8 (35.8) 173.2 (3.5)   Other 250    IV Piggyback 1461.4 100   Total Intake(mL/kg) 3508.2 (69.9) 273.2 (5.4)   Urine (mL/kg/hr) 1185 (1) 750 (1.8)   Other 1250    Blood 100    Chest Tube 220    Total Output 2755 750   Net +753.2 -476.8          . sodium chloride    . sodium chloride 10 mL/hr at 04/06/20 1200  . cefTRIAXone (ROCEPHIN)  IV 2 g (04/06/20 1145)     Lab Results  Component Value Date   WBC 14.1 (H) 04/06/2020   HGB 10.7 (L) 04/06/2020   HCT 32.3 (L) 04/06/2020   PLT 585 (H) 04/06/2020   GLUCOSE 71 04/06/2020   ALT 21 04/05/2020   AST 19 04/05/2020   NA 136 04/06/2020   K 4.0 04/06/2020   CL 103 04/06/2020   CREATININE 0.60 04/06/2020   BUN 13 04/06/2020   CO2 26 04/06/2020   INR 1.1 04/03/2020   Can d/c a line    04/05/2020 MD  Beeper [phone] Office [phone] 04/06/2020 3:32 PM

## 2020-04-06 NOTE — Progress Notes (Signed)
eLink Physician-Brief Progress Note Patient Name: Alyssa Vargas DOB: [birth year] MRN: [id number]   Date of Service  04/06/2020  HPI/Events of Note  Pneumothorax - L pneumothorax is larger on AM CXR. Sat = 99% on room air.  eICU Interventions  Will request that ground team evaluate the patient at bedside.      Intervention Category Major Interventions: Other:  Lenell Antu 04/06/2020, 6:48 AM

## 2020-04-06 NOTE — Progress Notes (Addendum)
TCTS DAILY ICU PROGRESS NOTE                   [address]            [place] [address]          [phone]   1 Day Post-Op Procedure(s) (LRB): VIDEO ASSISTED THORACOSCOPY (VATS)/EMPYEMA DRAINAGE, DECORTICATION  (Left)  Total Length of Stay:  LOS: 4 days   Subjective: Patient complaining of incisional and chest tube pain on the left.  Objective: Vital signs in last 24 hours: Temp:  [95.4 F (35.2 C)-98.06 F (36.7 C)] 97.88 F (36.6 C) (12/21 0800) Pulse Rate:  [63-80] 71 (12/21 0700) Cardiac Rhythm: Normal sinus rhythm (12/21 0700) Resp:  [13-33] 21 (12/21 0800) BP: (100-140)/(70-102) 120/85 (12/21 0800) SpO2:  [96 %-100 %] 100 % (12/21 0800) Arterial Line BP: (104-160)/(66-99) 136/83 (12/21 0800) FiO2 (%):  [21 %] 21 % (12/20 1313)  Filed Weights   04/01/20 2125 04/02/20 2220  Weight: 52.2 kg 50.2 kg      Intake/Output from previous day: 12/20 0701 - 12/21 0700 In: 3508.2 [I.V.:1796.8; IV Piggyback:1461.4] Out: 2655 [Urine:1085; Blood:100; Chest Tube:220]  Intake/Output this shift: Total I/O In: 75 [I.V.:75] Out: -   Current Meds: Scheduled Meds: . acetaminophen  1,000 mg Oral Q6H   Or  . acetaminophen (TYLENOL) oral liquid 160 mg/5 mL  1,000 mg Oral Q6H  . albuterol  2 puff Inhalation Q6H  . bisacodyl  10 mg Oral Daily  . busPIRone  10 mg Oral TID  . Chlorhexidine Gluconate Cloth  6 each Topical Daily  . dextromethorphan-guaiFENesin  1 tablet Oral BID  . enoxaparin (LOVENOX) injection  40 mg Subcutaneous Q24H  . feeding supplement  237 mL Oral TID BM  . insulin aspart  0-5 Units Subcutaneous QHS  . insulin aspart  0-9 Units Subcutaneous TID WC  . ketorolac  15 mg Intravenous Once  . lidocaine  1 patch Transdermal Q24H  . methylPREDNISolone (SOLU-MEDROL) injection  40 mg Intravenous Daily  . multivitamin with minerals  1 tablet Oral Daily  . nicotine  14 mg Transdermal Daily  . pantoprazole  40 mg Oral Daily  . senna-docusate  1  tablet Oral QHS  . traZODone  100 mg Oral QHS   Continuous Infusions: . sodium chloride    . sodium chloride 75 mL/hr at 04/06/20 0800  . remdesivir 100 mg in NS 100 mL 100 mg (04/05/20 0950)   PRN Meds:.Place/Maintain arterial line **AND** sodium chloride, albuterol, ALPRAZolam, guaiFENesin-dextromethorphan, HYDROmorphone (DILAUDID) injection, hydrOXYzine, ondansetron (ZOFRAN) IV, oxyCODONE  General appearance: alert, cooperative and no distress Heart: RRR Lungs: Clear on the right and diminished on the left Extremities: No LE edema Wound: Dressing is clean and dry Chest tubes: to suction, no air leak  Lab Results: CBC: Recent Labs    04/05/20 0342 04/06/20 0518  WBC 7.3 14.1*  HGB 10.6* 10.7*  HCT 32.9* 32.3*  PLT 584* 585*   BMET:  Recent Labs    04/05/20 0342 04/06/20 0518  NA 139 136  K 4.4 4.0  CL 106 103  CO2 25 26  GLUCOSE 114* 71  BUN 13 13  CREATININE 0.76 0.60  CALCIUM 7.5* 7.7*    CMET: Lab Results  Component Value Date   WBC 14.1 (H) 04/06/2020   HGB 10.7 (L) 04/06/2020   HCT 32.3 (L) 04/06/2020   PLT 585 (H) 04/06/2020   GLUCOSE 71 04/06/2020   ALT 21 04/05/2020  AST 19 04/05/2020   NA 136 04/06/2020   K 4.0 04/06/2020   CL 103 04/06/2020   CREATININE 0.60 04/06/2020   BUN 13 04/06/2020   CO2 26 04/06/2020   INR 1.1 04/03/2020      PT/INR: No results for input(s): LABPROT, INR in the last 72 hours. Radiology: DG CHEST PORT 1 VIEW  Result Date: 04/06/2020 CLINICAL DATA:  Chest tube status post VATS.  COVID positive. EXAM: PORTABLE CHEST 1 VIEW COMPARISON:  04/05/2020.  CT 04/02/2020. FINDINGS: Left subclavian line and 2 left chest tubes in stable position. Interim slight progression of left-sided pneumothorax. Stable left-sided pleural thickening. Persistent left lung atelectasis/infiltrate. Persistent density noted over the right lower chest. This density was noted to be within the right lung base on prior CT. This could represent a  collection of pleural fluid and or atelectasis. Heart size stable. Left chest wall and supraclavicular subcutaneous emphysema again noted. IMPRESSION: 1. Stable positioning of left subclavian line and 2 left chest tubes. Interim slight progression of left-sided pneumothorax. Left chest wall and supraclavicular subcutaneous emphysema again noted without interim change. 2. Persistent left lung atelectasis/infiltrate. Stable left-sided pleural thickening. 3. Persistent density noted the right lower chest consistent with fluid pseudotumor and or atelectasis. Electronically Signed   By: Maisie Fus  Register   On: 04/06/2020 06:42   DG Chest Port 1 View  Result Date: 04/05/2020 CLINICAL DATA:  Chest tube in place. EXAM: PORTABLE CHEST 1 VIEW COMPARISON:  04/05/2020. FINDINGS: Left IJ line noted with tip over SVC. Interval removal of left pigtail chest tube. Interval placement of 2 large bore chest tubes. Interval near complete resolution of large left pleural effusion. Small pneumothorax noted. Atelectasis/infiltrate left lung. Density noted over the right lung may be overlying the chest. Heart size normal. Left chest wall subcutaneous emphysema. IMPRESSION: 1. Interim removal of left pigtail chest tube. Interval placement of 2 large bore chest tubes with near complete resolution of large left pleural effusion. Small left pneumothorax noted. 2. Associated atelectasis/infiltrate left lung. These results will be called to the ordering clinician or representative by the Radiologist Assistant, and communication documented in the PACS or [employer]. Electronically Signed   By: Maisie Fus  Register   On: 04/05/2020 16:26     Assessment/Plan: S/P Procedure(s) (LRB): VIDEO ASSISTED THORACOSCOPY (VATS)/EMPYEMA DRAINAGE, DECORTICATION  (Left) 1. CV 2. Pulmonary-on room air. Chest tubes with 220 cc since surgery. Chest tubes are to suction;however, suction not working properly as orange toggle is down and not floating.  Nurse informed to try other suction. CXR this am shows some increase in left pneumothorax, left chest wall subcutaneous emphysema, and right base atelectasis .I did increase suction to 30 cm. Check CXR in am. Encourage incentive spirometer 3. CBGs 93/100/158. No history of DM but on Solumedrol. Per primary. 4. ID-WBC this am 14,100. On Vanco and Zosyn for Strep Pneumoniae. Also, on Solu medrol 5. Anemia-H and H this am stable at 10.7 and 32.3 6. COVID 19-on Remsdesivir and Solu Medrol 7. History of IVDU-Oxy, Ultram PRN ordered by TCTS. Dilaudid IV PRN added by Pulmonary. Also, given Lidocaine patch and Toradol this am. Per other services 8. Remove a line and foley, stop IVF  Donielle Margaretann Loveless PA-C 04/06/2020 9:16 AM   Pain medication orders per primary care team only , surgery will not be changing pain meds ,  Suction on chest tubes - fixed - technaical problems with wall suction Follow up chest xray in am I have seen  Bennie Pierini  and agree with the above assessment  and plan.  Delight Ovens MD Beeper [phone] Office [phone] 04/06/2020 9:40 AM

## 2020-04-06 NOTE — Progress Notes (Signed)
[facility]                                                                              Patient Demographics  Alyssa Vargas, is a 34 y.o. female, DOB - 07-26-1985, EAV:[id number]  Admit date - 04/01/2020   Admitting Physician Courage Mariea Clonts, MD  Outpatient Primary MD for the patient is Patient, No Pcp Per  Outpatient specialists:   LOS - 4  days   Medical records reviewed and are as summarized below:    Chief Complaint  Patient presents with  . Covid Exposure       Brief summary   34 yo WF with H/o IVDU (iv Heroin and iv Methamphetamine) and Tobacco abuse admitted on 04/02/2020 to [facility] with dyspnea, productive cough, loss of sense of taste and smell, anorexia, generalized aches, pains, malaise fatigue and myalgias and found to be positive for COVID-19 infection with chest x-ray consistent with very very large left-sided pleural effusion with mediastinal shift,-left-sided thoracentesis on 04/02/2020 you did purulent fluid consistent with empyema with Peptococcus pneumonia, she had CT guided chest tube insertion by IR as well, she was seen by CT surgery for which she had VATS with empyema drainage and decortication on 04/05/2020.  Subjective:   Alyssa Vargas was seen and examined today, he was sleeping comfortably, woke up to verbal stimuli reports she is having some pain at chest tube insertion site, otherwise denies any complaints.     Assessment & Plan   Massive left-sided empyema lung with mediastinal shift -Large left-sided empyema, CT surgery, Dr. Tyrone Sage was consulted by Dr Mariea Clonts, recommended IR to place large bore chest tube to drain the purulent/empyema fluid. -Presently on IV Zosyn and vancomycin, this has been noted to Rocephin once culture were available and showing Streptococcus pneumonia -Status post left-sided thoracentesis on 12/17, 1.88 L purulent appearing fluid removed, CT surgery input greatly appreciated, upon further evaluation decision  has been made to proceed with a VATS surgery, . -Status post 14 French drain placement in the left pleural empyema by IR on 12/18. - VATS with empyema drainage and decortication on 04/05/2020. -She is currently with pneumothorax, with 2 chest tube, management per CT surgery.   Acute hypoxic respiratory failure  -Multifactorial due to acute COVID-19 viral pneumonia and due to large left-sided empyema -On IV steroids, main contributing to hypoxia is empyema, currently off IV steroids. -Treated with Remdesivir. -Hypoxia resolved  COVID-19 infection -treated  with IV steroids and Remdesivir  Lab Results  Component Value Date   SARSCOV2NAA POSITIVE (A) 04/01/2020     Recent Labs  Lab 04/02/20 0158 04/03/20 0008 04/04/20 0415 04/05/20 0342  DDIMER 1.69* 1.52* 1.41* 1.85*  FERRITIN  --  249 264 191  CRP  --  6.1* 1.9* 1.0*  ALT 41 38 30 21    History of IV drug use (IV heroin, methamphetamine) -Patient reported she last used IV methamphetamine and heroin on Sunday, 03/28/20 -Follow closely for any withdrawals  Depression, anorexia -Denies suicidal or homicidal ideation or plan -Continue BuSpar   Hyponatremia -Resolved with hydration.  Tobacco use -Nicotine patch placed    Code Status: Full CODE  STATUS DVT Prophylaxis: Subcu Lovenox Family Communication: Discussed all imaging results, lab results, explained to the patient  Disposition Plan:     Status is: Inpatient  Remains inpatient appropriate because:Inpatient level of care appropriate due to severity of illness   Dispo: The patient is from: Home              Anticipated d/c is to: Home              Anticipated d/c date is: > 3 days              Patient currently is not medically stable to d/c.  Will need chest tube placement, on active Covid treatment       Procedures:  -Status post left-sided thoracentesis on 12/17, 1.88 L purulent appearing fluid removed, Gram stain with GPC, sensitivities still  pending -Status post 34 French drain placement in the left pleural empyema by IR on 12/18.  Consultants:   PCCM CT surgery Intervention radiology  Antimicrobials:   Anti-infectives (From admission, onward)   Start     Dose/Rate Route Frequency Ordered Stop   04/06/20 1045  cefTRIAXone (ROCEPHIN) 2 g in sodium chloride 0.9 % 100 mL IVPB        2 g 200 mL/hr over 30 Minutes Intravenous Every 24 hours 04/06/20 0945     04/05/20 1100  cefTRIAXone (ROCEPHIN) 2 g in sodium chloride 0.9 % 100 mL IVPB  Status:  Discontinued        2 g 200 mL/hr over 30 Minutes Intravenous Every 24 hours 04/05/20 1023 04/05/20 1453   04/03/20 1600  vancomycin (VANCOREADY) IVPB 750 mg/150 mL  Status:  Discontinued        750 mg 150 mL/hr over 60 Minutes Intravenous Every 12 hours 04/03/20 1500 04/05/20 1020   04/03/20 1000  remdesivir 100 mg in sodium chloride 0.9 % 100 mL IVPB  Status:  Discontinued       "Followed by" Linked Group Details   100 mg 200 mL/hr over 30 Minutes Intravenous Daily 04/02/20 0654 04/02/20 0656   04/03/20 1000  remdesivir 100 mg in sodium chloride 0.9 % 100 mL IVPB       "Followed by" Linked Group Details   100 mg 200 mL/hr over 30 Minutes Intravenous Daily 04/02/20 0658 04/06/20 0948   04/03/20 0400  vancomycin (VANCOREADY) IVPB 500 mg/100 mL  Status:  Discontinued        500 mg 100 mL/hr over 60 Minutes Intravenous Every 12 hours 04/02/20 1459 04/03/20 1500   04/02/20 2200  piperacillin-tazobactam (ZOSYN) IVPB 3.375 g  Status:  Discontinued        3.375 g 12.5 mL/hr over 240 Minutes Intravenous Every 8 hours 04/02/20 1332 04/05/20 1023   04/02/20 1515  vancomycin (VANCOREADY) IVPB 1250 mg/250 mL  Status:  Discontinued        1,250 mg 166.7 mL/hr over 90 Minutes Intravenous Every 24 hours 04/02/20 1449 04/02/20 1449   04/02/20 1515  vancomycin (VANCOREADY) IVPB 1250 mg/250 mL        1,250 mg 166.7 mL/hr over 90 Minutes Intravenous  Once 04/02/20 1449 04/02/20 1701   04/02/20  1345  piperacillin-tazobactam (ZOSYN) IVPB 3.375 g        3.375 g 100 mL/hr over 30 Minutes Intravenous  Once 04/02/20 1332 04/02/20 1434   04/02/20 0900  remdesivir 100 mg in sodium chloride 0.9 % 100 mL IVPB       "Followed by" Linked Group Details  100 mg 200 mL/hr over 30 Minutes Intravenous  Once 04/02/20 0658 04/02/20 1251   04/02/20 0800  remdesivir 100 mg in sodium chloride 0.9 % 100 mL IVPB       "Followed by" Linked Group Details   100 mg 200 mL/hr over 30 Minutes Intravenous  Once 04/02/20 0658 04/02/20 0853   04/02/20 0700  remdesivir 200 mg in sodium chloride 0.9% 250 mL IVPB  Status:  Discontinued       "Followed by" Linked Group Details   200 mg 580 mL/hr over 30 Minutes Intravenous Once 04/02/20 0654 04/02/20 0656         Medications  Scheduled Meds: . acetaminophen  1,000 mg Oral Q6H   Or  . acetaminophen (TYLENOL) oral liquid 160 mg/5 mL  1,000 mg Oral Q6H  . albuterol  2 puff Inhalation Q6H  . bisacodyl  10 mg Oral Daily  . busPIRone  10 mg Oral TID  . Chlorhexidine Gluconate Cloth  6 each Topical Daily  . dextromethorphan-guaiFENesin  1 tablet Oral BID  . enoxaparin (LOVENOX) injection  40 mg Subcutaneous Q24H  . feeding supplement  237 mL Oral TID BM  . insulin aspart  0-5 Units Subcutaneous QHS  . insulin aspart  0-9 Units Subcutaneous TID WC  . lidocaine  1 patch Transdermal Q24H  . multivitamin with minerals  1 tablet Oral Daily  . nicotine  14 mg Transdermal Daily  . pantoprazole  40 mg Oral Daily  . senna-docusate  1 tablet Oral QHS  . traZODone  100 mg Oral QHS   Continuous Infusions: . sodium chloride    . sodium chloride 10 mL/hr at 04/06/20 1200  . cefTRIAXone (ROCEPHIN)  IV 2 g (04/06/20 1145)   PRN Meds:.Place/Maintain arterial line **AND** sodium chloride, albuterol, ALPRAZolam, guaiFENesin-dextromethorphan, hydrOXYzine, ondansetron (ZOFRAN) IV, oxyCODONE       Objective:   Vitals:   04/06/20 1000 04/06/20 1100 04/06/20 1200  04/06/20 1300  BP: (!) 115/97 114/83    Pulse:   82   Resp: (!) 31 (!) 24 (!) 27 19  Temp: 98.42 F (36.9 C) 98.24 F (36.8 C) 98.6 F (37 C) 98.42 F (36.9 C)  TempSrc:      SpO2: 98% 100% 97% 96%  Weight:      Height:        Intake/Output Summary (Last 24 hours) at 04/06/2020 1454 Last data filed at 04/06/2020 1200 Gross per 24 hour  Intake 1419.97 ml  Output 1555 ml  Net -135.03 ml     Wt Readings from Last 3 Encounters:  04/02/20 50.2 kg  05/27/19 58.1 kg  05/26/19 58.1 kg     Exam  Old, no apparent distress, normal affect  Fair air entry in the right lung, left lung with coarse respiratory sounds  Regular rate and rhythm, no rubs or gallops  Bowel sounds present, No Cyanosis, Clubbing or edema, No new Rash or bruise     Data Reviewed:  I have personally reviewed following labs and imaging studies  Micro Results Recent Results (from the past 240 hour(s))  Resp Panel by RT-PCR (Flu A&B, Covid) Nasopharyngeal Swab     Status: Abnormal   Collection Time: 04/01/20 10:42 PM   Specimen: Nasopharyngeal Swab; Nasopharyngeal(NP) swabs in vial transport medium  Result Value Ref Range Status   SARS Coronavirus 2 by RT PCR POSITIVE (A) NEGATIVE Final    Comment: RESULT CALLED TO, READ BACK BY AND VERIFIED WITH: T WALKER,RN@2337  04/01/20 MKELLY (NOTE) SARS-CoV-2  target nucleic acids are DETECTED.  The SARS-CoV-2 RNA is generally detectable in upper respiratory specimens during the acute phase of infection. Positive results are indicative of the presence of the identified virus, but do not rule out bacterial infection or co-infection with other pathogens not detected by the test. Clinical correlation with patient history and other diagnostic information is necessary to determine patient infection status. The expected result is Negative.  Fact Sheet for Patients: BloggerCourse.com  Fact Sheet for Healthcare  Providers: SeriousBroker.it  This test is not yet approved or cleared by the [place] FDA and  has been authorized for detection and/or diagnosis of SARS-CoV-2 by FDA under an Emergency Use Authorization (EUA).  This EUA will remain in effect (meaning this test can be  used) for the duration of  the COVID-19 declaration under Section 564(b)(1) of the Act, 21 U.S.C. section 360bbb-3(b)(1), unless the authorization is terminated or revoked sooner.     Influenza A by PCR NEGATIVE NEGATIVE Final   Influenza B by PCR NEGATIVE NEGATIVE Final    Comment: (NOTE) The Xpert Xpress SARS-CoV-2/FLU/RSV plus assay is intended as an aid in the diagnosis of influenza from Nasopharyngeal swab specimens and should not be used as a sole basis for treatment. Nasal washings and aspirates are unacceptable for Xpert Xpress SARS-CoV-2/FLU/RSV testing.  Fact Sheet for Patients: BloggerCourse.com  Fact Sheet for Healthcare Providers: SeriousBroker.it  This test is not yet approved or cleared by the [place] FDA and has been authorized for detection and/or diagnosis of SARS-CoV-2 by FDA under an Emergency Use Authorization (EUA). This EUA will remain in effect (meaning this test can be used) for the duration of the COVID-19 declaration under Section 564(b)(1) of the Act, 21 U.S.C. section 360bbb-3(b)(1), unless the authorization is terminated or revoked.  Performed at [facility], [address], [place], [place] [address]   Gram stain     Status: None   Collection Time: 04/02/20 11:40 AM   Specimen: Pleura; Body Fluid  Result Value Ref Range Status   Specimen Description PLEURAL  Final   Special Requests NONE  Final   Gram Stain   Final    GRAM POSITIVE COCCI Gram Stain Report Called to,Read Back By and Verified With: EASTER,T@1417  by MATTHEWS, B 12.17.21 WBC PRESENT,BOTH PMN AND MONONUCLEAR Performed at [facility], [address], [place], [place] [address]    Report Status 04/02/2020 FINAL  Final  Culture, body fluid-bottle     Status: Abnormal   Collection Time: 04/02/20 11:40 AM   Specimen: Pleura  Result Value Ref Range Status   Specimen Description   Final    PLEURAL Performed at [facility], [address] [place], [place] [address]    Special Requests   Final    NONE Performed at [facility], [address], [place], [place] [address]    Gram Stain   Final    GRAM POSITIVE COCCI AEROBIC BOTTLE Gram Stain Report Called to,Read Back By and Verified With: EASTER,T ON 04/02/20 AT 2040 BY LOY,C Performed at [facility] ANAEROBIC BOTTLE ALSO Performed at [facility], [address], [place], [place] [address]    Culture STREPTOCOCCUS PNEUMONIAE (A)  Final   Report Status 04/05/2020 FINAL  Final   Organism ID, Bacteria STREPTOCOCCUS PNEUMONIAE  Final      Susceptibility   Streptococcus pneumoniae - MIC*    ERYTHROMYCIN <=0.12 SENSITIVE Sensitive     LEVOFLOXACIN 1 SENSITIVE Sensitive     VANCOMYCIN 0.5 SENSITIVE Sensitive  PENICILLIN (meningitis) <=0.06 SENSITIVE Sensitive     PENO - penicillin <=0.06      PENICILLIN (non-meningitis) <=0.06 SENSITIVE Sensitive     PENICILLIN (oral) <=0.06 SENSITIVE Sensitive     CEFTRIAXONE (non-meningitis) <=0.12 SENSITIVE Sensitive     CEFTRIAXONE (meningitis) <=0.12 SENSITIVE Sensitive     * STREPTOCOCCUS PNEUMONIAE  Culture, blood (Routine X 2) w Reflex to ID Panel     Status: None (Preliminary result)   Collection Time: 04/02/20  2:48 PM   Specimen: BLOOD  Result Value Ref Range Status   Specimen Description BLOOD LEFT ANTECUBITAL  Final   Special Requests   Final    BOTTLES DRAWN AEROBIC AND ANAEROBIC Blood Culture adequate volume   Culture   Final    NO GROWTH 2 DAYS Performed at [facility], [address], [place], [place] [address]    Report Status PENDING  Incomplete  Culture, blood (Routine X 2) w Reflex to ID  Panel     Status: None (Preliminary result)   Collection Time: 04/02/20  9:54 PM   Specimen: BLOOD  Result Value Ref Range Status   Specimen Description BLOOD RIGHT HAND  Final   Special Requests   Final    BOTTLES DRAWN AEROBIC ONLY Blood Culture adequate volume   Culture   Final    NO GROWTH 4 DAYS Performed at [facility], [address] [place], [place] [address]    Report Status PENDING  Incomplete  Culture, blood (Routine X 2) w Reflex to ID Panel     Status: None (Preliminary result)   Collection Time: 04/02/20  9:54 PM   Specimen: BLOOD  Result Value Ref Range Status   Specimen Description BLOOD RIGHT HAND  Final   Special Requests   Final    BOTTLES DRAWN AEROBIC ONLY Blood Culture adequate volume   Culture   Final    NO GROWTH 4 DAYS Performed at [facility], [address] [place], [place] [address]    Report Status PENDING  Incomplete  MRSA PCR Screening     Status: None   Collection Time: 04/03/20  7:11 AM   Specimen: Nasal Mucosa; Nasopharyngeal  Result Value Ref Range Status   MRSA by PCR NEGATIVE NEGATIVE Final    Comment:        The GeneXpert MRSA Assay (FDA approved for NASAL specimens only), is one component of a comprehensive MRSA colonization surveillance program. It is not intended to diagnose MRSA infection nor to guide or monitor treatment for MRSA infections. Performed at [facility], [address], [place], [place] [address]     Radiology Reports CT Angio Chest PE W and/or Wo Contrast  Result Date: 04/02/2020 CLINICAL DATA:  COVID exposure, dyspnea, cough, malaise, positive D-dimer EXAM: CT ANGIOGRAPHY CHEST WITH CONTRAST TECHNIQUE: Multidetector CT imaging of the chest was performed using the standard protocol during bolus administration of intravenous contrast. Multiplanar CT image reconstructions and MIPs were obtained to evaluate the vascular anatomy. CONTRAST:  OMNIPAQUE IOHEXOL 350 MG/ML SOLN COMPARISON:   None. FINDINGS: Cardiovascular: There is adequate opacification of the pulmonary arterial tree. There is no intraluminal filling defect identified to suggest acute pulmonary embolism. The central pulmonary arteries are of normal caliber. There is marked mediastinal shift to the right. Global cardiac size within normal limits. No significant coronary artery calcification. No pericardial effusion. The thoracic aorta is unremarkable. Mediastinum/Nodes: Thyroid unremarkable. Soft tissue within the anterior mediastinum likely represents rebound thymic or  residual thymic tissue. The esophagus is unremarkable. No pathologic thoracic adenopathy is identified. Lungs/Pleura: There is a massive left pleural effusion which completely fills the left hemithorax, completely collapses the left lung, and demonstrates marked mass effect upon the mediastinum with marked mediastinal shift to the right. There is interstitial gas within a portion of the left lower lobe likely representing the lateral segment of the left lower lobe which may represent necrosis of the setting of necrotizing pneumonia. Mild patchy ground-glass infiltrate within the a right upper lobe anteriorly is nonspecific, possibly infectious or inflammatory. A bilobed fluid density structure is seen within the right middle lobe measuring 2.3 x 3.6 x 1.6 cm in greatest dimension, nonspecific, but possibly the sequela of prior infection or trauma. Upper Abdomen: No acute abnormality. Musculoskeletal: No acute bone abnormality. Review of the MIP images confirms the above findings. IMPRESSION: No pulmonary embolism. Massive left pleural effusion, possibly representing a a parapneumonic effusion or empyema, demonstrating marked mass effect upon the mediastinum with marked left right mediastinal shift. Extensive interstitial gas within the probable lateral segment of the right lower lobe suggesting parenchymal necrosis in the setting of necrotizing pneumonia. Bilobed  fluid-filled structure within the right middle lobe measuring 3.6 cm possibly representing the sequela of remote trauma or inflammation. Minimal patchy infiltrate within the right upper lobe, likely infectious or inflammatory. Electronically Signed   By: Helyn Numbers MD   On: 04/02/2020 04:18   DG CHEST PORT 1 VIEW  Result Date: 04/06/2020 CLINICAL DATA:  Chest tube status post VATS.  COVID positive. EXAM: PORTABLE CHEST 1 VIEW COMPARISON:  04/05/2020.  CT 04/02/2020. FINDINGS: Left subclavian line and 2 left chest tubes in stable position. Interim slight progression of left-sided pneumothorax. Stable left-sided pleural thickening. Persistent left lung atelectasis/infiltrate. Persistent density noted over the right lower chest. This density was noted to be within the right lung base on prior CT. This could represent a collection of pleural fluid and or atelectasis. Heart size stable. Left chest wall and supraclavicular subcutaneous emphysema again noted. IMPRESSION: 1. Stable positioning of left subclavian line and 2 left chest tubes. Interim slight progression of left-sided pneumothorax. Left chest wall and supraclavicular subcutaneous emphysema again noted without interim change. 2. Persistent left lung atelectasis/infiltrate. Stable left-sided pleural thickening. 3. Persistent density noted the right lower chest consistent with fluid pseudotumor and or atelectasis. Electronically Signed   By: Maisie Fus  Register   On: 04/06/2020 06:42   DG Chest Port 1 View  Result Date: 04/05/2020 CLINICAL DATA:  Chest tube in place. EXAM: PORTABLE CHEST 1 VIEW COMPARISON:  04/05/2020. FINDINGS: Left IJ line noted with tip over SVC. Interval removal of left pigtail chest tube. Interval placement of 2 large bore chest tubes. Interval near complete resolution of large left pleural effusion. Small pneumothorax noted. Atelectasis/infiltrate left lung. Density noted over the right lung may be overlying the chest. Heart size  normal. Left chest wall subcutaneous emphysema. IMPRESSION: 1. Interim removal of left pigtail chest tube. Interval placement of 2 large bore chest tubes with near complete resolution of large left pleural effusion. Small left pneumothorax noted. 2. Associated atelectasis/infiltrate left lung. These results will be called to the ordering clinician or representative by the Radiologist Assistant, and communication documented in the PACS or [employer]. Electronically Signed   By: Maisie Fus  Register   On: 04/05/2020 16:26   DG Chest Port 1 View  Result Date: 04/05/2020 CLINICAL DATA:  Shortness of breath, COVID-19 positivity EXAM: PORTABLE CHEST 1 VIEW COMPARISON:  04/04/2020 FINDINGS: Cardiac shadow is obscured by the large left empyema. Right lung remains clear. Left hemithorax demonstrates opacification relatively similar to that seen on the prior exam. Pigtail catheter is noted in place. Slight improved aeration in the left upper lobe is noted. No bony abnormality is seen. IMPRESSION: Slight improved aeration on the left. Pigtail catheter remains in place with relatively CT stable left empyema. Electronically Signed   By: Alcide Clever M.D.   On: 04/05/2020 09:04   DG Chest Port 1 View  Result Date: 04/04/2020 CLINICAL DATA:  Shortness of breath.  History of COVID. EXAM: PORTABLE CHEST 1 VIEW COMPARISON:  Chest radiograph 04/02/2020. FINDINGS: Monitoring leads overlie the patient. Cardiac and mediastinal contours largely obscured. Interval insertion left chest tube. Interval decrease in size of left pleural fluid within the upper hemithorax and mid hemithorax. Similar-appearing nodular opacity right mid lung. IMPRESSION: 1. Interval insertion left chest tube with interval decrease in size of left pleural fluid. 2. Similar-appearing nodular opacity right mid lung. Electronically Signed   By: Annia Belt M.D.   On: 04/04/2020 10:46   DG Chest Portable 1 View  Result Date: 04/02/2020 CLINICAL DATA:   Large LEFT pleural effusion EXAM: PORTABLE CHEST 1 VIEW COMPARISON:  Portable exam at 1203 compared to 0227 hrs FINDINGS: Resolution of previously identified LEFT to RIGHT mediastinal shift. Unable to assess heart size due to subtotal opacification of the LEFT hemithorax. Minimal aeration now seen in LEFT lung. No pneumothorax. Nodular foci at inferior RIGHT lung again seen with minimal RIGHT basilar atelectasis. Osseous structures unremarkable. IMPRESSION: No pneumothorax following LEFT thoracentesis. Resolution of LEFT to RIGHT mediastinal shift. Persistent nodular foci at inferior RIGHT hemithorax. Electronically Signed   By: Ulyses Southward M.D.   On: 04/02/2020 12:32   DG Chest Port 1 View  Result Date: 04/02/2020 CLINICAL DATA:  COVID, shortness of breath EXAM: PORTABLE CHEST 1 VIEW COMPARISON:  None. FINDINGS: Complete whiteout of the left hemithorax, likely related to a combination of effusion and airspace disease. Heart and mediastinal structures are shifted to the right. Right mid lung patchy airspace disease. No effusion on the right. No acute bony abnormality. IMPRESSION: IMPRESSION Complete opacification of the left hemithorax, likely related to effusion and airspace disease. Patchy right mid lung airspace disease. Electronically Signed   By: Charlett Nose M.D.   On: 04/02/2020 02:48   CT [facility] PLEURAL DRAIN W/INDWELL CATH W/IMG GUIDE  Result Date: 04/03/2020 CLINICAL DATA:  Large left pleural empyema, COVID-19 infection and history of IV drug use. Request for percutaneous thoracostomy tube placement to drain the empyema. EXAM: CT GUIDED CATHETER DRAINAGE OF LEFT PLEURAL EMPYEMA ANESTHESIA/SEDATION: 2.0 mg IV Versed 100 mcg IV Fentanyl Total Moderate Sedation Time:  10 minutes The patient's level of consciousness and physiologic status were continuously monitored during the procedure by Radiology nursing. PROCEDURE: The procedure, risks, benefits, and alternatives were explained to the patient.  Questions regarding the procedure were encouraged and answered. The patient understands and consents to the procedure. A time out was performed prior to initiating the procedure. CT was performed through the chest in a supine position. The left chest wall was prepped with chlorhexidine in a sterile fashion, and a sterile drape was applied covering the operative field. A sterile gown and sterile gloves were used for the procedure. Local anesthesia was provided with 1% Lidocaine. Under CT guidance, an 18 gauge trocar needle was advanced into the left pleural space from an anterolateral approach. After confirming needle tip position, fluid  was aspirated. A guidewire was advanced and the needle removed. The percutaneous tract was dilated and a 14 French pigtail drainage catheter advanced into the left pleural space. Catheter position was confirmed by CT. The pigtail catheter was connected to a Pleur-evac device. The catheter was secured at the skin with a Prolene retention suture, StatLock device and dressed with a Vaseline gauze dressing. COMPLICATIONS: None FINDINGS: A large amount of residual left pleural fluid remains despite large volume thoracentesis yesterday. Aspiration from the pleural space yielded grossly purulent fluid. Repeat laboratory analysis was not performed as the fluid removed at the time of thoracentesis yesterday was sent for laboratory testing. After placement of the 14 French drainage catheter, there is good return of purulent fluid. The Pleur-evac will be connected to wall suction at -20 cm of water when the patient returns to her hospital room. IMPRESSION: CT-guided placement of 14 French left chest tube. A 14 French drainage catheter was placed with return of purulent fluid. The drainage catheter was attached to a Pleur-evac device which will be attached to wall suction. Electronically Signed   By: Irish Lack M.D.   On: 04/03/2020 13:33   ECHOCARDIOGRAM LIMITED  Result Date:  04/03/2020    ECHOCARDIOGRAM LIMITED REPORT   Patient Name:   TASHONDA Terrio Date of Exam: 04/03/2020 Medical Rec #:  [id number]     Height:       67.0 in Accession #:    [id number]    Weight:       110.7 lb Date of Birth:  [birth year]     BSA:          1.573 m Patient Age:    34 years      BP:           109/79 mmHg Patient Gender: F             HR:           99 bpm. Exam Location:  Inpatient Procedure: Limited Echo, Limited Color Doppler and Cardiac Doppler Indications:    endocarditis  History:        Patient has no prior history of Echocardiogram examinations.                 Covid. Pleural effusion.; Risk Factors:Current Smoker and IV                 drug use.  Sonographer:    Delcie Roch Referring Phys: [id number] COURAGE EMOKPAE  Sonographer Comments: Image acquisition challenging due to respiratory motion. IMPRESSIONS  1. Left ventricular ejection fraction, by estimation, is 60 to 65%. The left ventricle has normal function.  2. Right ventricular systolic function is normal. The right ventricular size is normal.  3. A small pericardial effusion is present. The pericardial effusion is circumferential.  4. The mitral valve is normal in structure. No evidence of mitral valve regurgitation. No evidence of mitral stenosis.  5. The aortic valve was not well visualized. Aortic valve regurgitation is not visualized. No aortic stenosis is present.  6. The inferior vena cava is normal in size with greater than 50% respiratory variability, suggesting right atrial pressure of 3 mmHg. FINDINGS  Left Ventricle: Left ventricular ejection fraction, by estimation, is 60 to 65%. The left ventricle has normal function. Right Ventricle: The right ventricular size is normal. Right vetricular wall thickness was not well visualized. Right ventricular systolic function is normal. Pericardium: A small pericardial effusion is present. The pericardial effusion is circumferential. Mitral Valve: The  mitral valve is normal in structure.  No evidence of mitral valve stenosis. Tricuspid Valve: The tricuspid valve is normal in structure. Tricuspid valve regurgitation is not demonstrated. No evidence of tricuspid stenosis. Aortic Valve: The aortic valve was not well visualized. Aortic valve regurgitation is not visualized. No aortic stenosis is present. Pulmonic Valve: The pulmonic valve was normal in structure. Pulmonic valve regurgitation is not visualized. No evidence of pulmonic stenosis. Venous: The inferior vena cava is normal in size with greater than 50% respiratory variability, suggesting right atrial pressure of 3 mmHg. LEFT VENTRICLE PLAX 2D LVIDd:         3.90 cm LVIDs:         2.30 cm LV IVS:        0.70 cm LVOT diam:     1.60 cm LVOT Area:     2.01 cm  IVC IVC diam: 1.90 cm LEFT ATRIUM         Index LA diam:    2.10 cm 1.34 cm/m   AORTA Ao Root diam: 2.20 cm MITRAL VALVE MV Area (PHT): 5.23 cm    SHUNTS MV Decel Time: 145 msec    Systemic Diam: 1.60 cm MV E velocity: 88.70 cm/s MV A velocity: 61.70 cm/s MV E/A ratio:  1.44 Dina Rich MD Electronically signed by Dina Rich MD Signature Date/Time: 04/03/2020/2:31:28 PM    Final    US THORACENTESIS ASP PLEURAL SPACE W/IMG GUIDE  Result Date: 04/02/2020 INDICATION: Large LEFT pleural effusion EXAM: ULTRASOUND GUIDED DIAGNSOTIC AND THERAPEUTIC LEFT THORACENTESIS MEDICATIONS: None. COMPLICATIONS: None immediate. PROCEDURE: Procedure, benefits, and risks of procedure were discussed with patient. Written informed consent for procedure was obtained. Time out protocol followed. Pleural effusion localized by ultrasound at the posterior LEFT hemithorax. Fluid is markedly complex containing diffuse internal echogenicity as well as dependent echogenicity. Skin prepped and draped in usual sterile fashion. Skin and soft tissues anesthetized with 10 mL of 1% lidocaine. 8 French thoracentesis catheter placed into the LEFT pleural space. 1.88 L of complex cloudy tan fluid with particulates  with purulent appearance aspirated by syringe pump. Findings most likely represent empyema. Procedure tolerated well by patient without immediate complication. FINDINGS: Complex purulent appearing LEFT pleural fluid likely representing empyema. IMPRESSION: Successful ultrasound guided LEFT thoracentesis yielding 1.88 L of pleural fluid. Electronically Signed   By: Ulyses Southward M.D.   On: 04/02/2020 12:39    Lab Data:  CBC: Recent Labs  Lab 04/02/20 0158 04/03/20 0008 04/04/20 0415 04/05/20 0342 04/06/20 0518  WBC 9.5 8.5 6.0 7.3 14.1*  NEUTROABS 6.8 4.7 3.7 5.2  --   HGB 12.3 11.2* 12.3 10.6* 10.7*  HCT 36.2 33.4* 38.7 32.9* 32.3*  MCV 84.2 82.5 85.4 86.4 85.2  PLT 558* 584* 485* 584* 585*   Basic Metabolic Panel: Recent Labs  Lab 04/02/20 0158 04/02/20 0728 04/02/20 2154 04/03/20 0008 04/04/20 0415 04/05/20 0342 04/06/20 0518  NA 125*   < > 135 133* 136 139 136  K 3.9   < > 3.9 3.6 4.0 4.4 4.0  CL 87*   < > 94* 96* 101 106 103  CO2 28   < > 26 26 25 25 26   GLUCOSE 115*   < > 96 107* 121* 114* 71  BUN 15   < > 21* 23* 17 13 13   CREATININE 0.69   < > 0.70 0.69 0.73 0.76 0.60  CALCIUM 8.5*   < > 8.3* 8.0* 7.9* 7.5* 7.7*  MG 1.8  --   --  1.9 1.8 1.7  --   PHOS 4.2  --   --  4.5 3.1 3.1  --    < > = values in this interval not displayed.   GFR: Estimated Creatinine Clearance: 80 mL/min (by C-G formula based on SCr of 0.6 mg/dL). Liver Function Tests: Recent Labs  Lab 04/02/20 0158 04/03/20 0008 04/04/20 0415 04/05/20 0342  AST 50* 43* 27 19  ALT 41 38 30 21  ALKPHOS 68 56 47 40  BILITOT 0.7 0.5 0.5 0.3  PROT 7.9 5.7* 5.8* 5.2*  ALBUMIN 2.4* 1.7* 1.7* 1.6*   No results for input(s): LIPASE, AMYLASE in the last 168 hours. No results for input(s): AMMONIA in the last 168 hours. Coagulation Profile: Recent Labs  Lab 04/03/20 0008  INR 1.1   Cardiac Enzymes: No results for input(s): CKTOTAL, CKMB, CKMBINDEX, TROPONINI in the last 168 hours. BNP (last 3  results) No results for input(s): PROBNP in the last 8760 hours. HbA1C: No results for input(s): HGBA1C in the last 72 hours. CBG: Recent Labs  Lab 04/05/20 0735 04/05/20 1617 04/05/20 2132 04/06/20 1002 04/06/20 1328  GLUCAP 100* 158* 146* 72 98   Lipid Profile: No results for input(s): CHOL, HDL, LDLCALC, TRIG, CHOLHDL, LDLDIRECT in the last 72 hours. Thyroid Function Tests: No results for input(s): TSH, T4TOTAL, FREET4, T3FREE, THYROIDAB in the last 72 hours. Anemia Panel: Recent Labs    04/04/20 0415 04/05/20 0342  FERRITIN 264 191   Urine analysis: No results found for: COLORURINE, APPEARANCEUR, LABSPEC, PHURINE, GLUCOSEU, HGBUR, BILIRUBINUR, KETONESUR, PROTEINUR, UROBILINOGEN, NITRITE, Arby BarretteLEUKOCYTESUR   Odin Mariani M.D. [facility] 04/06/2020, 2:54 PM   Call night coverage person covering after 7pm

## 2020-04-06 NOTE — Progress Notes (Signed)
Brief Nutrition Note:   Full nutrition assessment completed yesterday. Consult received today for Calorie count  OR yesterday for VATS decortication, empyema drainage and chest tube placement. Pt also COVID+  Noted pt started on Heart Healthy diet. Recorded po intake 25% today and yesterday.   Poor oral intake, combined with increased needs in setting of COVID+  and acute illness, and underweight status with reported wt loss put pt at high nutritional risk. If po intake inadequate post nutrition interventions as outline below, recommend insertion of Cortrak tube  Interventions:    Liberalize diet to REGULAR  Continue Ensure Enlive po TID, each supplement provides 350 kcal and 20 grams of protein  Magic cup TID with meals, each supplement provides 290 kcal and 9 grams of protein  MVI with Minerals  Calorie Count x 48 hours per MD  RD recommends considering Cortrak placement if po intake inadequate with interventions after Calorie Count complete   Romelle Starcher MS, RDN, LDN, CNSC Registered Dietitian III Clinical Nutrition RD Pager and On-Call Pager Number Located in [place]

## 2020-04-06 NOTE — Progress Notes (Signed)
Accucheck redone from initial reading, patient's blood glucose 89.

## 2020-04-07 ENCOUNTER — Inpatient Hospital Stay (HOSPITAL_COMMUNITY): Payer: Medicaid Other

## 2020-04-07 LAB — COMPREHENSIVE METABOLIC PANEL
ALT: 16 U/L (ref 0–44)
AST: 18 U/L (ref 15–41)
Albumin: 1.6 g/dL — ABNORMAL LOW (ref 3.5–5.0)
Alkaline Phosphatase: 46 U/L (ref 38–126)
Anion gap: 8 (ref 5–15)
BUN: 13 mg/dL (ref 6–20)
CO2: 25 mmol/L (ref 22–32)
Calcium: 7.9 mg/dL — ABNORMAL LOW (ref 8.9–10.3)
Chloride: 104 mmol/L (ref 98–111)
Creatinine, Ser: 0.6 mg/dL (ref 0.44–1.00)
GFR, Estimated: >60 mL/min (ref >60)
Glucose, Bld: 102 mg/dL — ABNORMAL HIGH (ref 70–99)
Potassium: 3.7 mmol/L (ref 3.5–5.1)
Sodium: 137 mmol/L (ref 135–145)
Total Bilirubin: 0.4 mg/dL (ref 0.3–1.2)
Total Protein: 4.9 g/dL — ABNORMAL LOW (ref 6.5–8.1)

## 2020-04-07 LAB — CULTURE, BLOOD (ROUTINE X 2)
Culture: NO GROWTH
Culture: NO GROWTH
Special Requests: ADEQUATE
Special Requests: ADEQUATE

## 2020-04-07 LAB — CBC
HCT: 31.4 % — ABNORMAL LOW (ref 36.0–46.0)
Hemoglobin: 10.6 g/dL — ABNORMAL LOW (ref 12.0–15.0)
MCH: 28.6 pg (ref 26.0–34.0)
MCHC: 33.8 g/dL (ref 30.0–36.0)
MCV: 84.6 fL (ref 80.0–100.0)
Platelets: 564 10*3/uL — ABNORMAL HIGH (ref 150–400)
RBC: 3.71 MIL/uL — ABNORMAL LOW (ref 3.87–5.11)
RDW: 14 % (ref 11.5–15.5)
WBC: 12.1 10*3/uL — ABNORMAL HIGH (ref 4.0–10.5)
nRBC: 0 % (ref 0.0–0.2)

## 2020-04-07 LAB — GLUCOSE, CAPILLARY
Glucose-Capillary: 75 mg/dL (ref 70–99)
Glucose-Capillary: 106 mg/dL — ABNORMAL HIGH (ref 70–99)
Glucose-Capillary: 112 mg/dL — ABNORMAL HIGH (ref 70–99)
Glucose-Capillary: 91 mg/dL (ref 70–99)

## 2020-04-07 LAB — MAGNESIUM: Magnesium: 1.7 mg/dL (ref 1.7–2.4)

## 2020-04-07 MED ORDER — POLYETHYLENE GLYCOL 3350 17 G PO PACK
34.0000 g | PACK | Freq: Two times a day (BID) | ORAL | Status: AC
  Administered 2020-04-07 – 2020-04-08 (×2): 34 g via ORAL
  Filled 2020-04-07 (×3): qty 2

## 2020-04-07 MED ORDER — LORAZEPAM 2 MG/ML IJ SOLN
1.0000 mg | Freq: Once | INTRAMUSCULAR | Status: AC
  Administered 2020-04-07: 1 mg via INTRAVENOUS
  Filled 2020-04-07: qty 1

## 2020-04-07 MED ORDER — MAGNESIUM SULFATE IN D5W 1-5 GM/100ML-% IV SOLN
1.0000 g | Freq: Once | INTRAVENOUS | Status: AC
  Administered 2020-04-07: 1 g via INTRAVENOUS
  Filled 2020-04-07: qty 100

## 2020-04-07 MED ORDER — KETOROLAC TROMETHAMINE 30 MG/ML IJ SOLN
30.0000 mg | Freq: Once | INTRAMUSCULAR | Status: AC
  Administered 2020-04-07: 30 mg via INTRAVENOUS
  Filled 2020-04-07: qty 1

## 2020-04-07 MED ORDER — ACETAMINOPHEN 160 MG/5ML PO SOLN: 650.0000 mg | Freq: Four times a day (QID) | ORAL | Status: AC

## 2020-04-07 MED ORDER — ACETAMINOPHEN 325 MG PO TABS
650.0000 mg | ORAL_TABLET | Freq: Four times a day (QID) | ORAL | Status: AC
  Administered 2020-04-07 – 2020-04-10 (×10): 650 mg via ORAL
  Filled 2020-04-07 (×9): qty 2

## 2020-04-07 MED ORDER — OXYCODONE HCL 5 MG PO TABS
10.0000 mg | ORAL_TABLET | ORAL | Status: DC | PRN
  Administered 2020-04-07 – 2020-04-08 (×5): 10 mg via ORAL
  Filled 2020-04-07 (×5): qty 2

## 2020-04-07 NOTE — Progress Notes (Signed)
Nutrition Follow Up  DOCUMENTATION CODES:   Severe malnutrition in context of acute illness/injury,Underweight  INTERVENTION:   No meal tickets or envelope in patient room, d/c calorie count  Patient reports eating 100% of meals, nursing documentation shows 25-50%. Recommend Cortrak placement if poor PO intake continues.    Magic cup TID with meals, each supplement provides 290 kcal and 9 grams of protein  Ensure Enlive po to TID, each supplement provides 350 kcal and 20 grams of protein  MVI daily   NUTRITION DIAGNOSIS:   Severe Malnutrition related to acute illness (COVID 19 infection) as evidenced by moderate fat depletion,severe muscle depletion,energy intake < or equal to 50% for > or equal to 5 days.  GOAL:   Patient will meet greater than or equal to 90% of their needs  MONITOR:   Supplement acceptance,Weight trends,PO intake,Labs,I & O's  REASON FOR ASSESSMENT:   Malnutrition Screening Tool    ASSESSMENT:   Patient with medical history significant for tobacco abuse. Presents this admission with L pleural effusion in the setting of acute respiratory failure with hypoxia due to COVID-19 infection.   12/17- s/p L thoracentesis (1.88 L fluid removed) 12/18- s/p L chest tube placement  Pt endorses a loss in appetite one week PTA. Denies taste changes. States during this time she consumed bites and sips. Prior to that she ate two meals daily that consisted of B-eggs, bacon, toast D- spaghetti or pizza. Did not use supplementation.   When asked about intake this admission patient states she is eating great. She eats mostly fruit cups. Eager to get lunch tray. Per RN, patient consume 25-50% of meals and has refused Ensure.   RD discussed next steps if protein intake does not progress, such as Cortrak given her elevated needs. Pt wants to try Ensure and Magic Cups. Refuses high protein snacks.   Admission weight: 50.2 kg   UOP 1850 ml x 24 hrs  Chest tube: 295 ml x  24 hrs   Drips: NS @ 10 ml/hr  Medications: dulcolax, SS novolog, miralax, senokot Labs: CBG 63-134  Flowsheet Row Most Recent Value  Orbital Region Mild depletion  Upper Arm Region Moderate depletion  Thoracic and Lumbar Region Unable to assess  Buccal Region Severe depletion  Temple Region Moderate depletion  Clavicle Bone Region Severe depletion  Clavicle and Acromion Bone Region Severe depletion  Scapular Bone Region Unable to assess  Dorsal Hand Severe depletion  Patellar Region Severe depletion  Anterior Thigh Region Severe depletion  Posterior Calf Region Severe depletion  Edema (RD Assessment) None  Hair Reviewed  Eyes Reviewed  Mouth Reviewed  Skin Reviewed  Nails Reviewed     Diet Order:   Diet Order            Diet regular Room service appropriate? Yes; Fluid consistency: Thin  Diet effective now                 EDUCATION NEEDS:   Education needs have been addressed  Skin:  Skin Assessment: Skin Integrity Issues: Skin Integrity Issues:: Incisions Incisions: L chest  Last BM:  12/20  Height:   Ht Readings from Last 1 Encounters:  04/02/20 5\' 7"  (1.702 m)    Weight:   Wt Readings from Last 1 Encounters:  04/02/20 50.2 kg    Ideal Body Weight:  61.4 kg  BMI:  Body mass index is 17.33 kg/m.  Estimated Nutritional Needs:   Kcal:  2000-2200  Protein:  110-125 grams  Fluid:  >  2 L  Vanessa Kick RD, LDN Clinical Nutrition Pager listed in [facility]

## 2020-04-07 NOTE — Progress Notes (Addendum)
[facility] DAILY ICU PROGRESS NOTE                   [address]            [place] [address]          [phone]   2 Days Post-Op Procedure(s) (LRB): VIDEO ASSISTED THORACOSCOPY (VATS)/EMPYEMA DRAINAGE, DECORTICATION  (Left)  Total Length of Stay:  LOS: 5 days   Subjective: Patient complaining of incisional and chest tube pain on the left.  Objective: Vital signs in last 24 hours: Temp:  [97.7 F (36.5 C)-98.6 F (37 C)] 97.7 F (36.5 C) (12/22 0400) Pulse Rate:  [77-82] 77 (12/21 1803) Cardiac Rhythm: Normal sinus rhythm (12/22 0400) Resp:  [18-33] 23 (12/22 0600) BP: (103-132)/(73-97) 123/92 (12/22 0600) SpO2:  [96 %-100 %] 98 % (12/22 0600) Arterial Line BP: (125-158)/(79-93) 150/81 (12/21 1800)  Filed Weights   04/01/20 2125 04/02/20 2220  Weight: 52.2 kg 50.2 kg      Intake/Output from previous day: 12/21 0701 - 12/22 0700 In: 945.2 [P.O.:467; I.V.:378.2; IV Piggyback:100] Out: 2145 [Urine:1850; Chest Tube:295]  Intake/Output this shift: No intake/output data recorded.  Current Meds: Scheduled Meds: . acetaminophen  1,000 mg Oral Q6H   Or  . acetaminophen (TYLENOL) oral liquid 160 mg/5 mL  1,000 mg Oral Q6H  . albuterol  2 puff Inhalation Q6H  . bisacodyl  10 mg Oral Daily  . busPIRone  10 mg Oral TID  . Chlorhexidine Gluconate Cloth  6 each Topical Daily  . dextromethorphan-guaiFENesin  1 tablet Oral BID  . enoxaparin (LOVENOX) injection  40 mg Subcutaneous Q24H  . feeding supplement  237 mL Oral TID BM  . insulin aspart  0-5 Units Subcutaneous QHS  . insulin aspart  0-9 Units Subcutaneous TID WC  . lidocaine  1 patch Transdermal Q24H  . multivitamin with minerals  1 tablet Oral Daily  . nicotine  14 mg Transdermal Daily  . pantoprazole  40 mg Oral Daily  . senna-docusate  1 tablet Oral QHS  . traZODone  100 mg Oral QHS   Continuous Infusions: . sodium chloride    . sodium chloride 10 mL/hr at 04/07/20 0500  . cefTRIAXone  (ROCEPHIN)  IV 2 g (04/06/20 1145)   PRN Meds:.Place/Maintain arterial line **AND** sodium chloride, albuterol, ALPRAZolam, guaiFENesin-dextromethorphan, hydrOXYzine, ketorolac, ondansetron (ZOFRAN) IV, oxyCODONE  General appearance: alert, cooperative and no distress Heart: RRR Lungs: Clear on the right and diminished on the left Extremities: No LE edema Wound: Dressing is clean and dry Chest tubes: to suction, no air leak  Lab Results: CBC: Recent Labs    04/06/20 0518 04/07/20 0549  WBC 14.1* 12.1*  HGB 10.7* 10.6*  HCT 32.3* 31.4*  PLT 585* 564*   BMET:  Recent Labs    04/06/20 0518 04/07/20 0549  NA 136 137  K 4.0 3.7  CL 103 104  CO2 26 25  GLUCOSE 71 102*  BUN 13 13  CREATININE 0.60 0.60  CALCIUM 7.7* 7.9*    CMET: Lab Results  Component Value Date   WBC 12.1 (H) 04/07/2020   HGB 10.6 (L) 04/07/2020   HCT 31.4 (L) 04/07/2020   PLT 564 (H) 04/07/2020   GLUCOSE 102 (H) 04/07/2020   ALT 16 04/07/2020   AST 18 04/07/2020   NA 137 04/07/2020   K 3.7 04/07/2020   CL 104 04/07/2020   CREATININE 0.60 04/07/2020   BUN 13 04/07/2020   CO2 25 04/07/2020  INR 1.1 04/03/2020      PT/INR: No results for input(s): LABPROT, INR in the last 72 hours. Radiology: No results found.   Assessment/Plan: S/P Procedure(s) (LRB): VIDEO ASSISTED THORACOSCOPY (VATS)/EMPYEMA DRAINAGE, DECORTICATION  (Left)   1. CV-SR with HR in the 80's 2. Pulmonary-on room air. Chest tubes with 295 cc since surgery. Chest tubes are to suction and there is a small air leak. CXR this am shows left pneumothorax, left chest wall and neck subcutaneous emphysema stable, and right base atelectasis . Chest tubes to remain for now. Check CXR in am. Encourage incentive spirometer 3. CBGs 89/75/106. No history of DM. Per primary. 4. ID-WBC this am decreased to 12,100. On Rocephin  for Strep Pneumoniae. Also, on Solu medrol 5. Anemia-H and H this am stable at 10.6 and 31.4 6. COVID 19-Previously  treated with Remsdesivir and Solu Medrol 7. History of IVDU-Oxy, Ultram, Toradol PRN. Has Lidocaine patch as well 8. Supplement potassium 9. Ok from [facility] to transfer  Ardelle Balls PA-C 04/07/2020 7:38 AM   Leave chest tubes  I have seen and examined Alyssa Vargas and agree with the above assessment  and plan.  Delight Ovens MD Beeper [phone] Office [phone] 04/07/2020 9:56 PM

## 2020-04-07 NOTE — Plan of Care (Signed)

## 2020-04-07 NOTE — Progress Notes (Addendum)
[facility]                                                                              Patient Demographics  Alyssa Vargas, is a 34 y.o. female, DOB - 1985/11/08, VQQ:[id number]  Admit date - 04/01/2020   Admitting Physician Courage Mariea Clonts, MD  Outpatient Primary MD for the patient is Patient, No Pcp Per  Outpatient specialists:   LOS - 5  days   Medical records reviewed and are as summarized below:    Chief Complaint  Patient presents with  . Covid Exposure       Brief summary   34 yo WF with H/o IVDU (iv Heroin and iv Methamphetamine) and Tobacco abuse admitted on 04/02/2020 to [facility] with dyspnea, productive cough, loss of sense of taste and smell, anorexia, generalized aches, pains, malaise fatigue and myalgias and found to be positive for COVID-19 infection with chest x-ray consistent with very very large left-sided pleural effusion with mediastinal shift,-left-sided thoracentesis on 04/02/2020 you did purulent fluid consistent with empyema with Peptococcus pneumonia, she had CT guided chest tube insertion by IR as well, she was seen by CT surgery for which she had VATS with empyema drainage and decortication on 04/05/2020.  Subjective:   Briteny Vargas was seen and examined today, reports pain at chest tube insertion site, denies any chest pain, fever or chills.   Assessment & Plan   Massive left-sided empyema lung with mediastinal shift -Large left-sided empyema, CT surgery, Dr. Tyrone Sage was consulted by Dr Mariea Clonts, recommended IR to place large bore chest tube to drain the purulent/empyema fluid. -Presently on IV Zosyn and vancomycin, this has been noted to Rocephin once culture were available and showing Streptococcus pneumonia -Status post left-sided thoracentesis on 12/17, 1.88 L purulent appearing fluid removed, CT surgery input greatly appreciated, upon further evaluation decision has been made to proceed with a VATS surgery, . -Status post 14  French drain placement in the left pleural empyema by IR on 12/18. - VATS with empyema drainage and decortication on 04/05/2020. -She is currently with pneumothorax, with 2 chest tube, management per CT surgery. -Chest tube management, and  pneumothorax management per CT surgery, chest tubes on suction, with a small air leak, still with left pneumothorax, chest wall and neck subcutaneous emphysema are stable.   -We will increase her p.o. oxycodone for pain, IV Toradol has been added yesterday, and scheduled Tylenol.  Acute hypoxic respiratory failure  -Multifactorial due to acute COVID-19 viral pneumonia and due to large left-sided empyema -recieved  IV steroids for COVID-19 of pneumonia, has rapidly tapered R hypoxia thought to be mainly due to empyema. -Resolved, on room air -Treated with Remdesivir.  COVID-19 infection -treated  with IV steroids and Remdesivir  Lab Results  Component Value Date   SARSCOV2NAA POSITIVE (A) 04/01/2020     Recent Labs  Lab 04/02/20 0158 04/03/20 0008 04/04/20 0415 04/05/20 0342 04/07/20 0549  DDIMER 1.69* 1.52* 1.41* 1.85*  --   FERRITIN  --  249 264 191  --   CRP  --  6.1* 1.9* 1.0*  --   ALT 41 38 30 21 16  History of IV drug use (IV heroin, methamphetamine) -Patient reported she last used IV methamphetamine and heroin on 0 mg 100 mL/hr over 60 Minutes Intravenous Every 12 hours 04/02/20 1459 04/03/20 1500   04/02/20 2200  piperacillin-tazobactam (ZOSYN) IVPB 3.375 g  Status:  Discontinued        3.375 g 12.5 mL/hr over 240 Minutes Intravenous Every 8 hours 04/02/20 1332 04/05/20 1023   04/02/20 1515  vancomycin  (VANCOREADY) IVPB 1250 mg/250 mL  Status:  Discontinued        1,250 mg 166.7 mL/hr over 90 Minutes Intravenous Every 24 hours 04/02/20 1449 04/02/20 1449   04/02/20 1515  vancomycin (VANCOREADY) IVPB 1250 mg/250 mL        1,250 mg 166.7 mL/hr over 90 Minutes Intravenous  Once 04/02/20 1449 04/02/20 1701   04/02/20 1345  piperacillin-tazobactam (ZOSYN) IVPB 3.375 g        3.375 g 100 mL/hr over 30 Minutes Intravenous  Once 04/02/20 1332 04/02/20 1434   04/02/20 0900  remdesivir 100 mg in sodium chloride 0.9 % 100 mL IVPB       "Followed by" Linked Group Details   100 mg 200 mL/hr over 30 Minutes Intravenous  Once 04/02/20 0658 04/02/20 1251   04/02/20 0800  remdesivir 100 mg in sodium chloride 0.9 % 100 mL IVPB       "Followed by" Linked Group Details   100 mg 200 mL/hr over 30 Minutes Intravenous  Once 04/02/20 0658 04/02/20 0853   04/02/20 0700  remdesivir 200 mg in sodium chloride 0.9% 250 mL IVPB  Status:  Discontinued       "Followed by" Linked Group Details   200 mg 580 mL/hr over 30 Minutes Intravenous Once 04/02/20 0654 04/02/20 0656         Medications  Scheduled Meds: . acetaminophen  1,000 mg Oral Q6H   Or  . acetaminophen (TYLENOL) oral liquid 160 mg/5 mL  1,000 mg Oral Q6H  . albuterol  2 puff Inhalation Q6H  . bisacodyl  10 mg Oral Daily  . busPIRone  10 mg Oral TID  . Chlorhexidine Gluconate Cloth  6 each Topical Daily  . dextromethorphan-guaiFENesin  1 tablet Oral BID  . enoxaparin (LOVENOX) injection  40 mg Subcutaneous Q24H  . feeding supplement  237 mL Oral TID BM  . insulin aspart  0-5 Units Subcutaneous QHS  . insulin aspart  0-9 Units Subcutaneous TID WC  . lidocaine  1 patch Transdermal Q24H  . multivitamin with minerals  1 tablet Oral Daily  . nicotine  14 mg Transdermal Daily  . pantoprazole  40 mg Oral Daily  . senna-docusate  1 tablet Oral QHS  . traZODone  100 mg Oral QHS   Continuous Infusions: . sodium chloride    . sodium chloride 10  mL/hr at 04/07/20 1000  . cefTRIAXone (ROCEPHIN)  IV 2 g (04/07/20 1016)   PRN Meds:.Place/Maintain arterial line **AND** sodium chloride, albuterol, ALPRAZolam, guaiFENesin-dextromethorphan, hydrOXYzine, ketorolac, ondansetron (ZOFRAN) IV, oxyCODONE       Objective:   Vitals:   04/07/20 0900 04/07/20 1000 04/07/20 1100 04/07/20 1200  BP: (!) 122/91 (!) 142/86 (!) 126/98 106/65  Pulse:      Resp: (!) 25 (!) 23 (!) 31 17  Temp:      TempSrc:      SpO2: 98% 100% 98% 99%  Weight:      Height:        Intake/Output Summary (Last 24 hours) at 04/07/2020 1206 Last data filed at 04/07/2020 1023 Gross per 24 hour  Intake 721.89 ml  Output 1815 ml  Net -1093.11 ml     Wt Readings from Last 3 Encounters:  04/02/20 50.2 kg  05/27/19 58.1 kg  05/26/19 58.1 kg     Exam  Awake Alert, Oriented X 3, No new F.N deficits, in minimal discomfort due to pain, otherwise in no apparent distress Symmetrical Chest wall movement, respiratory sounds in the left lung, with 2 chest tubes. RRR,No Gallops,Rubs or new Murmurs, No Parasternal Heave +ve B.Sounds No Cyanosis, Clubbing or edema, No new Rash or bruise      Data Reviewed:  I have personally reviewed following labs and imaging studies  Micro Results Recent Results (from the past 240 hour(s))  Resp Panel by RT-PCR (Flu A&B, Covid) Nasopharyngeal Swab  Status: Abnormal   Collection Time: 04/01/20 10:42 PM   Specimen: Nasopharyngeal Swab; Nasopharyngeal(NP) swabs in vial transport medium  Result Value Ref Range Status   SARS Coronavirus 2 by RT PCR POSITIVE (A) NEGATIVE Final    Comment: RESULT CALLED TO, READ BACK BY AND VERIFIED WITH: T WALKER,RN@2337  04/01/20 MKELLY (NOTE) SARS-CoV-2 target nucleic acids are DETECTED.  The SARS-CoV-2 RNA is generally detectable in upper respiratory specimens during the acute phase of infection. Positive results are indicative of the presence of the identified virus, but do not rule out  bacterial infection or co-infection with other pathogens not detected by the test. Clinical correlation with patient history and other diagnostic information is necessary to determine patient infection status. The expected result is Negative.  Fact Sheet for Patients: BloggerCourse.com  Fact Sheet for Healthcare Providers: SeriousBroker.it  This test is not yet approved or cleared by the [place] FDA and  has been authorized for detection and/or diagnosis of SARS-CoV-2 by FDA under an Emergency Use Authorization (EUA).  This EUA will remain in effect (meaning this test can be  used) for the duration of  the COVID-19 declaration under Section 564(b)(1) of the Act, 21 U.S.C. section 360bbb-3(b)(1), unless the authorization is terminated or revoked sooner.     Influenza A by PCR NEGATIVE NEGATIVE Final   Influenza B by PCR NEGATIVE NEGATIVE Final    Comment: (NOTE) The Xpert Xpress SARS-CoV-2/FLU/RSV plus assay is intended as an aid in the diagnosis of influenza from Nasopharyngeal swab specimens and should not be used as a sole basis for treatment. Nasal washings and aspirates are unacceptable for Xpert Xpress SARS-CoV-2/FLU/RSV testing.  Fact Sheet for Patients: BloggerCourse.com  Fact Sheet for Healthcare Providers: SeriousBroker.it  This test is not yet approved or cleared by the [place] FDA and has been authorized for detection and/or diagnosis of SARS-CoV-2 by FDA under an Emergency Use Authorization (EUA). This EUA will remain in effect (meaning this test can be used) for the duration of the COVID-19 declaration under Section 564(b)(1) of the Act, 21 U.S.C. section 360bbb-3(b)(1), unless the authorization is terminated or revoked.  Performed at [facility], [address], [place], [place] [address]   Gram stain     Status: None   Collection Time: 04/02/20  11:40 AM   Specimen: Pleura; Body Fluid  Result Value Ref Range Status   Specimen Description PLEURAL  Final   Special Requests NONE  Final   Gram Stain   Final    GRAM POSITIVE COCCI Gram Stain Report Called to,Read Back By and Verified With: EASTER,T@1417  by MATTHEWS, B 12.17.21 WBC PRESENT,BOTH PMN AND MONONUCLEAR Performed at [facility], [address], [place], [place] [address]    Report Status 04/02/2020 FINAL  Final  Culture, body fluid-bottle     Status: Abnormal   Collection Time: 04/02/20 11:40 AM   Specimen: Pleura  Result Value Ref Range Status   Specimen Description   Final    PLEURAL Performed at [facility], [address] [place], [place] [address]    Special Requests   Final    NONE Performed at [facility], [address] [place], [place] [address]    Gram Stain   Final    GRAM POSITIVE COCCI AEROBIC BOTTLE Gram Stain Report Called to,Read Back By and Verified With: EASTER,T ON 04/02/20 AT 2040 BY LOY,C Performed at [facility] ANAEROBIC BOTTLE ALSO Performed at [facility], [address], [place], [place] [address]    Culture  STREPTOCOCCUS PNEUMONIAE (A)  Final   Report Status 04/05/2020 FINAL  Final   Organism ID, Bacteria STREPTOCOCCUS PNEUMONIAE  Final      Susceptibility   Streptococcus pneumoniae - MIC*    ERYTHROMYCIN <=0.12 SENSITIVE Sensitive     LEVOFLOXACIN 1 SENSITIVE Sensitive     VANCOMYCIN 0.5 SENSITIVE Sensitive     PENICILLIN (meningitis) <=0.06 SENSITIVE Sensitive     PENO - penicillin <=0.06      PENICILLIN (non-meningitis) <=0.06 SENSITIVE Sensitive     PENICILLIN (oral) <=0.06 SENSITIVE Sensitive     CEFTRIAXONE (non-meningitis) <=0.12 SENSITIVE Sensitive     CEFTRIAXONE (meningitis) <=0.12 SENSITIVE Sensitive     * STREPTOCOCCUS PNEUMONIAE  Culture, blood (Routine X 2) w Reflex to ID Panel     Status: None (Preliminary result)   Collection Time: 04/02/20  2:48 PM   Specimen: BLOOD  Result Value Ref Range Status    Specimen Description BLOOD LEFT ANTECUBITAL  Final   Special Requests   Final    BOTTLES DRAWN AEROBIC AND ANAEROBIC Blood Culture adequate volume   Culture   Final    NO GROWTH 4 DAYS Performed at [facility], [address] [place], [place] [address]    Report Status PENDING  Incomplete  Culture, blood (Routine X 2) w Reflex to ID Panel     Status: None (Preliminary result)   Collection Time: 04/02/20  9:54 PM   Specimen: BLOOD  Result Value Ref Range Status   Specimen Description BLOOD RIGHT HAND  Final   Special Requests   Final    BOTTLES DRAWN AEROBIC ONLY Blood Culture adequate volume   Culture   Final    NO GROWTH 4 DAYS Performed at [facility], [address] [place], [place] [address]    Report Status PENDING  Incomplete  Culture, blood (Routine X 2) w Reflex to ID Panel     Status: None (Preliminary result)   Collection Time: 04/02/20  9:54 PM   Specimen: BLOOD  Result Value Ref Range Status   Specimen Description BLOOD RIGHT HAND  Final   Special Requests   Final    BOTTLES DRAWN AEROBIC ONLY Blood Culture adequate volume   Culture   Final    NO GROWTH 4 DAYS Performed at [facility], [address], [place], [place] [address]    Report Status PENDING  Incomplete  MRSA PCR Screening     Status: None   Collection Time: 04/03/20  7:11 AM   Specimen: Nasal Mucosa; Nasopharyngeal  Result Value Ref Range Status   MRSA by PCR NEGATIVE NEGATIVE Final    Comment:        The GeneXpert MRSA Assay (FDA approved for NASAL specimens only), is one component of a comprehensive MRSA colonization surveillance program. It is not intended to diagnose MRSA infection nor to guide or monitor treatment for MRSA infections. Performed at [facility], [address], [place], [place] [address]     Radiology Reports CT Angio Chest PE W and/or Wo Contrast  Result Date: 04/02/2020 CLINICAL DATA:  COVID exposure, dyspnea, cough, malaise, positive D-dimer  EXAM: CT ANGIOGRAPHY CHEST WITH CONTRAST TECHNIQUE: Multidetector CT imaging of the chest was performed using the standard protocol during bolus administration of intravenous contrast. Multiplanar CT image reconstructions and MIPs were obtained to evaluate the vascular anatomy. CONTRAST:  OMNIPAQUE IOHEXOL 350 MG/ML SOLN COMPARISON:  None. FINDINGS: Cardiovascular: There is adequate opacification of the pulmonary arterial tree.  There is no intraluminal filling defect identified to suggest acute pulmonary embolism. The central pulmonary arteries are of normal caliber. There is marked mediastinal shift to the right. Global cardiac size within normal limits. No significant coronary artery calcification. No pericardial effusion. The thoracic aorta is unremarkable. Mediastinum/Nodes: Thyroid unremarkable. Soft tissue within the anterior mediastinum likely represents rebound thymic or residual thymic tissue. The esophagus is unremarkable. No pathologic thoracic adenopathy is identified. Lungs/Pleura: There is a massive left pleural effusion which completely fills the left hemithorax, completely collapses the left lung, and demonstrates marked mass effect upon the mediastinum with marked mediastinal shift to the right. There is interstitial gas within a portion of the left lower lobe likely representing the lateral segment of the left lower lobe which may represent necrosis of the setting of necrotizing pneumonia. Mild patchy ground-glass infiltrate within the a right upper lobe anteriorly is nonspecific, possibly infectious or inflammatory. A bilobed fluid density structure is seen within the right middle lobe measuring 2.3 x 3.6 x 1.6 cm in greatest dimension, nonspecific, but possibly the sequela of prior infection or trauma. Upper Abdomen: No acute abnormality. Musculoskeletal: No acute bone abnormality. Review of the MIP images confirms the above findings. IMPRESSION: No pulmonary embolism. Massive left pleural  effusion, possibly representing a a parapneumonic effusion or empyema, demonstrating marked mass effect upon the mediastinum with marked left right mediastinal shift. Extensive interstitial gas within the probable lateral segment of the right lower lobe suggesting parenchymal necrosis in the setting of necrotizing pneumonia. Bilobed fluid-filled structure within the right middle lobe measuring 3.6 cm possibly representing the sequela of remote trauma or inflammation. Minimal patchy infiltrate within the right upper lobe, likely infectious or inflammatory. Electronically Signed   By: Helyn Numbers MD   On: 04/02/2020 04:18   DG Chest Port 1 View  Result Date: 04/07/2020 CLINICAL DATA:  Coronavirus infection.  Chest tube. EXAM: PORTABLE CHEST 1 VIEW COMPARISON:  04/06/2020 FINDINGS: Left subclavian central line tip the level of the azygos vein as seen previously. Two left chest tubes remain in place. Less pleural air than was seen yesterday. Small to moderate left pleural air collection does persist. Persistent infiltrate and volume loss in the left lung. Small amount of pleural fluid on the left. Few mild patchy infiltrates in the right lung appear similar. IMPRESSION: Less pleural air on the left than was seen yesterday. Small to moderate left pleural air collection does persist. Persistent infiltrate and volume loss in the left lung. Few mild patchy infiltrates in the right lung appear similar. Electronically Signed   By: Paulina Fusi M.D.   On: 04/07/2020 08:09   DG CHEST PORT 1 VIEW  Result Date: 04/06/2020 CLINICAL DATA:  Chest tube status post VATS.  COVID positive. EXAM: PORTABLE CHEST 1 VIEW COMPARISON:  04/05/2020.  CT 04/02/2020. FINDINGS: Left subclavian line and 2 left chest tubes in stable position. Interim slight progression of left-sided pneumothorax. Stable left-sided pleural thickening. Persistent left lung atelectasis/infiltrate. Persistent density noted over the right lower chest. This  density was noted to be within the right lung base on prior CT. This could represent a collection of pleural fluid and or atelectasis. Heart size stable. Left chest wall and supraclavicular subcutaneous emphysema again noted. IMPRESSION: 1. Stable positioning of left subclavian line and 2 left chest tubes. Interim slight progression of left-sided pneumothorax. Left chest wall and supraclavicular subcutaneous emphysema again noted without interim change. 2. Persistent left lung atelectasis/infiltrate. Stable left-sided pleural thickening. 3. Persistent density noted  the right lower chest consistent with fluid pseudotumor and or atelectasis. Electronically Signed   By: Maisie Fus  Register   On: 04/06/2020 06:42   DG Chest Port 1 View  Result Date: 04/05/2020 CLINICAL DATA:  Chest tube in place. EXAM: PORTABLE CHEST 1 VIEW COMPARISON:  04/05/2020. FINDINGS: Left IJ line noted with tip over SVC. Interval removal of left pigtail chest tube. Interval placement of 2 large bore chest tubes. Interval near complete resolution of large left pleural effusion. Small pneumothorax noted. Atelectasis/infiltrate left lung. Density noted over the right lung may be overlying the chest. Heart size normal. Left chest wall subcutaneous emphysema. IMPRESSION: 1. Interim removal of left pigtail chest tube. Interval placement of 2 large bore chest tubes with near complete resolution of large left pleural effusion. Small left pneumothorax noted. 2. Associated atelectasis/infiltrate left lung. These results will be called to the ordering clinician or representative by the Radiologist Assistant, and communication documented in the PACS or [employer]. Electronically Signed   By: Maisie Fus  Register   On: 04/05/2020 16:26   DG Chest Port 1 View  Result Date: 04/05/2020 CLINICAL DATA:  Shortness of breath, COVID-19 positivity EXAM: PORTABLE CHEST 1 VIEW COMPARISON:  04/04/2020 FINDINGS: Cardiac shadow is obscured by the large left  empyema. Right lung remains clear. Left hemithorax demonstrates opacification relatively similar to that seen on the prior exam. Pigtail catheter is noted in place. Slight improved aeration in the left upper lobe is noted. No bony abnormality is seen. IMPRESSION: Slight improved aeration on the left. Pigtail catheter remains in place with relatively CT stable left empyema. Electronically Signed   By: Alcide Clever M.D.   On: 04/05/2020 09:04   DG Chest Port 1 View  Result Date: 04/04/2020 CLINICAL DATA:  Shortness of breath.  History of COVID. EXAM: PORTABLE CHEST 1 VIEW COMPARISON:  Chest radiograph 04/02/2020. FINDINGS: Monitoring leads overlie the patient. Cardiac and mediastinal contours largely obscured. Interval insertion left chest tube. Interval decrease in size of left pleural fluid within the upper hemithorax and mid hemithorax. Similar-appearing nodular opacity right mid lung. IMPRESSION: 1. Interval insertion left chest tube with interval decrease in size of left pleural fluid. 2. Similar-appearing nodular opacity right mid lung. Electronically Signed   By: Annia Belt M.D.   On: 04/04/2020 10:46   DG Chest Portable 1 View  Result Date: 04/02/2020 CLINICAL DATA:  Large LEFT pleural effusion EXAM: PORTABLE CHEST 1 VIEW COMPARISON:  Portable exam at 1203 compared to 0227 hrs FINDINGS: Resolution of previously identified LEFT to RIGHT mediastinal shift. Unable to assess heart size due to subtotal opacification of the LEFT hemithorax. Minimal aeration now seen in LEFT lung. No pneumothorax. Nodular foci at inferior RIGHT lung again seen with minimal RIGHT basilar atelectasis. Osseous structures unremarkable. IMPRESSION: No pneumothorax following LEFT thoracentesis. Resolution of LEFT to RIGHT mediastinal shift. Persistent nodular foci at inferior RIGHT hemithorax. Electronically Signed   By: Ulyses Southward M.D.   On: 04/02/2020 12:32   DG Chest Port 1 View  Result Date: 04/02/2020 CLINICAL DATA:   COVID, shortness of breath EXAM: PORTABLE CHEST 1 VIEW COMPARISON:  None. FINDINGS: Complete whiteout of the left hemithorax, likely related to a combination of effusion and airspace disease. Heart and mediastinal structures are shifted to the right. Right mid lung patchy airspace disease. No effusion on the right. No acute bony abnormality. IMPRESSION: IMPRESSION Complete opacification of the left hemithorax, likely related to effusion and airspace disease. Patchy right mid lung airspace disease. Electronically Signed  By: Charlett Nose M.D.   On: 04/02/2020 02:48   CT [facility] PLEURAL DRAIN W/INDWELL CATH W/IMG GUIDE  Result Date: 04/03/2020 CLINICAL DATA:  Large left pleural empyema, COVID-19 infection and history of IV drug use. Request for percutaneous thoracostomy tube placement to drain the empyema. EXAM: CT GUIDED CATHETER DRAINAGE OF LEFT PLEURAL EMPYEMA ANESTHESIA/SEDATION: 2.0 mg IV Versed 100 mcg IV Fentanyl Total Moderate Sedation Time:  10 minutes The patient's level of consciousness and physiologic status were continuously monitored during the procedure by Radiology nursing. PROCEDURE: The procedure, risks, benefits, and alternatives were explained to the patient. Questions regarding the procedure were encouraged and answered. The patient understands and consents to the procedure. A time out was performed prior to initiating the procedure. CT was performed through the chest in a supine position. The left chest wall was prepped with chlorhexidine in a sterile fashion, and a sterile drape was applied covering the operative field. A sterile gown and sterile gloves were used for the procedure. Local anesthesia was provided with 1% Lidocaine. Under CT guidance, an 18 gauge trocar needle was advanced into the left pleural space from an anterolateral approach. After confirming needle tip position, fluid was aspirated. A guidewire was advanced and the needle removed. The percutaneous tract was dilated and a  14 French pigtail drainage catheter advanced into the left pleural space. Catheter position was confirmed by CT. The pigtail catheter was connected to a Pleur-evac device. The catheter was secured at the skin with a Prolene retention suture, StatLock device and dressed with a Vaseline gauze dressing. COMPLICATIONS: None FINDINGS: A large amount of residual left pleural fluid remains despite large volume thoracentesis yesterday. Aspiration from the pleural space yielded grossly purulent fluid. Repeat laboratory analysis was not performed as the fluid removed at the time of thoracentesis yesterday was sent for laboratory testing. After placement of the 14 French drainage catheter, there is good return of purulent fluid. The Pleur-evac will be connected to wall suction at -20 cm of water when the patient returns to her hospital room. IMPRESSION: CT-guided placement of 14 French left chest tube. A 14 French drainage catheter was placed with return of purulent fluid. The drainage catheter was attached to a Pleur-evac device which will be attached to wall suction. Electronically Signed   By: Irish Lack M.D.   On: 04/03/2020 13:33   ECHOCARDIOGRAM LIMITED  Result Date: 04/03/2020    ECHOCARDIOGRAM LIMITED REPORT   Patient Name:   LACHANDA Gikas Date of Exam: 04/03/2020 Medical Rec #:  [id number]     Height:       67.0 in Accession #:    [id number]    Weight:       110.7 lb Date of Birth:  Sep 07, 1985     BSA:          1.573 m Patient Age:    34 years      BP:           109/79 mmHg Patient Gender: F             HR:           99 bpm. Exam Location:  Inpatient Procedure: Limited Echo, Limited Color Doppler and Cardiac Doppler Indications:    endocarditis  History:        Patient has no prior history of Echocardiogram examinations.                 Covid. Pleural effusion.; Risk Factors:Current Smoker and IV  drug use.  Sonographer:    Delcie Roch Referring Phys: [id number] COURAGE EMOKPAE  Sonographer  Comments: Image acquisition challenging due to respiratory motion. IMPRESSIONS  1. Left ventricular ejection fraction, by estimation, is 60 to 65%. The left ventricle has normal function.  2. Right ventricular systolic function is normal. The right ventricular size is normal.  3. A small pericardial effusion is present. The pericardial effusion is circumferential.  4. The mitral valve is normal in structure. No evidence of mitral valve regurgitation. No evidence of mitral stenosis.  5. The aortic valve was not well visualized. Aortic valve regurgitation is not visualized. No aortic stenosis is present.  6. The inferior vena cava is normal in size with greater than 50% respiratory variability, suggesting right atrial pressure of 3 mmHg. FINDINGS  Left Ventricle: Left ventricular ejection fraction, by estimation, is 60 to 65%. The left ventricle has normal function. Right Ventricle: The right ventricular size is normal. Right vetricular wall thickness was not well visualized. Right ventricular systolic function is normal. Pericardium: A small pericardial effusion is present. The pericardial effusion is circumferential. Mitral Valve: The mitral valve is normal in structure. No evidence of mitral valve stenosis. Tricuspid Valve: The tricuspid valve is normal in structure. Tricuspid valve regurgitation is not demonstrated. No evidence of tricuspid stenosis. Aortic Valve: The aortic valve was not well visualized. Aortic valve regurgitation is not visualized. No aortic stenosis is present. Pulmonic Valve: The pulmonic valve was normal in structure. Pulmonic valve regurgitation is not visualized. No evidence of pulmonic stenosis. Venous: The inferior vena cava is normal in size with greater than 50% respiratory variability, suggesting right atrial pressure of 3 mmHg. LEFT VENTRICLE PLAX 2D LVIDd:         3.90 cm LVIDs:         2.30 cm LV IVS:        0.70 cm LVOT diam:     1.60 cm LVOT Area:     2.01 cm  IVC IVC diam: 1.90  cm LEFT ATRIUM         Index LA diam:    2.10 cm 1.34 cm/m   AORTA Ao Root diam: 2.20 cm MITRAL VALVE MV Area (PHT): 5.23 cm    SHUNTS MV Decel Time: 145 msec    Systemic Diam: 1.60 cm MV E velocity: 88.70 cm/s MV A velocity: 61.70 cm/s MV E/A ratio:  1.44 Dina Rich MD Electronically signed by Dina Rich MD Signature Date/Time: 04/03/2020/2:31:28 PM    Final    US THORACENTESIS ASP PLEURAL SPACE W/IMG GUIDE  Result Date: 04/02/2020 INDICATION: Large LEFT pleural effusion EXAM: ULTRASOUND GUIDED DIAGNSOTIC AND THERAPEUTIC LEFT THORACENTESIS MEDICATIONS: None. COMPLICATIONS: None immediate. PROCEDURE: Procedure, benefits, and risks of procedure were discussed with patient. Written informed consent for procedure was obtained. Time out protocol followed. Pleural effusion localized by ultrasound at the posterior LEFT hemithorax. Fluid is markedly complex containing diffuse internal echogenicity as well as dependent echogenicity. Skin prepped and draped in usual sterile fashion. Skin and soft tissues anesthetized with 10 mL of 1% lidocaine. 8 French thoracentesis catheter placed into the LEFT pleural space. 1.88 L of complex cloudy tan fluid with particulates with purulent appearance aspirated by syringe pump. Findings most likely represent empyema. Procedure tolerated well by patient without immediate complication. FINDINGS: Complex purulent appearing LEFT pleural fluid likely representing empyema. IMPRESSION: Successful ultrasound guided LEFT thoracentesis yielding 1.88 L of pleural fluid. Electronically Signed   By: Ulyses Southward M.D.   On: 04/02/2020 12:39  Lab Data:  CBC: Recent Labs  Lab 04/02/20 0158 04/03/20 0008 04/04/20 0415 04/05/20 0342 04/06/20 0518 04/07/20 0549  WBC 9.5 8.5 6.0 7.3 14.1* 12.1*  NEUTROABS 6.8 4.7 3.7 5.2  --   --   HGB 12.3 11.2* 12.3 10.6* 10.7* 10.6*  HCT 36.2 33.4* 38.7 32.9* 32.3* 31.4*  MCV 84.2 82.5 85.4 86.4 85.2 84.6  PLT 558* 584* 485* 584* 585*  564*   Basic Metabolic Panel: Recent Labs  Lab 04/02/20 0158 04/02/20 0728 04/03/20 0008 04/04/20 0415 04/05/20 0342 04/06/20 0518 04/07/20 0549  NA 125*   < > 133* 136 139 136 137  K 3.9   < > 3.6 4.0 4.4 4.0 3.7  CL 87*   < > 96* 101 106 103 104  CO2 28   < > 26 25 25 26 25   GLUCOSE 115*   < > 107* 121* 114* 71 102*  BUN 15   < > 23* 17 13 13 13   CREATININE 0.69   < > 0.69 0.73 0.76 0.60 0.60  CALCIUM 8.5*   < > 8.0* 7.9* 7.5* 7.7* 7.9*  MG 1.8  --  1.9 1.8 1.7  --  1.7  PHOS 4.2  --  4.5 3.1 3.1  --   --    < > = values in this interval not displayed.   GFR: Estimated Creatinine Clearance: 80 mL/min (by C-G formula based on SCr of 0.6 mg/dL). Liver Function Tests: Recent Labs  Lab 04/02/20 0158 04/03/20 0008 04/04/20 0415 04/05/20 0342 04/07/20 0549  AST 50* 43* 27 19 18   ALT 41 38 30 21 16   ALKPHOS 68 56 47 40 46  BILITOT 0.7 0.5 0.5 0.3 0.4  PROT 7.9 5.7* 5.8* 5.2* 4.9*  ALBUMIN 2.4* 1.7* 1.7* 1.6* 1.6*   No results for input(s): LIPASE, AMYLASE in the last 168 hours. No results for input(s): AMMONIA in the last 168 hours. Coagulation Profile: Recent Labs  Lab 04/03/20 0008  INR 1.1   Cardiac Enzymes: No results for input(s): CKTOTAL, CKMB, CKMBINDEX, TROPONINI in the last 168 hours. BNP (last 3 results) No results for input(s): PROBNP in the last 8760 hours. HbA1C: No results for input(s): HGBA1C in the last 72 hours. CBG: Recent Labs  Lab 04/06/20 1841 04/06/20 2124 04/06/20 2126 04/07/20 0823 04/07/20 1201  GLUCAP 134* 63* 89 75 106*   Lipid Profile: No results for input(s): CHOL, HDL, LDLCALC, TRIG, CHOLHDL, LDLDIRECT in the last 72 hours. Thyroid Function Tests: No results for input(s): TSH, T4TOTAL, FREET4, T3FREE, THYROIDAB in the last 72 hours. Anemia Panel: Recent Labs    04/05/20 0342  FERRITIN 191   Urine analysis: No results found for: COLORURINE, APPEARANCEUR, LABSPEC, PHURINE, GLUCOSEU, HGBUR, BILIRUBINUR, KETONESUR,  PROTEINUR, UROBILINOGEN, NITRITE, Arby Barrette Orris Perin M.D. [facility] 04/07/2020, 12:06 PM   Call night coverage person covering after 7pm

## 2020-04-07 NOTE — Op Note (Signed)
NAME: Alyssa Vargas, Alyssa Vargas MEDICAL RECORD WY:[id number] ACCOUNT [id number] DATE OF BIRTH:1985-07-21 FACILITY: [facility] LOCATION: [facility] PHYSICIAN:Eliab Closson BTyrone Sage, MD  OPERATIVE REPORT Preop- Left Chest empyema Postop Same Procedure: Left VATS Drainage of Empyema with decortication  DATE OF PROCEDURE:  04/05/2020  The patient is a 34 year old female who was seen and admitted to [facility] several days prior to the procedure.  She originally went to the emergency room because of shortness of breath and fever and all thought to be COVID-related symptoms.  COVID test  was positive.  She was kept at [facility] for several days due to lack of beds at [facility].  On admission, chest x-ray in her left chest was totally whited out.  A thoracentesis was done, but without any permanent drainage, 1800 mL of frank pus was removed.   Cultures of this were done.  On transfer, a pigtail catheter was placed into the left chest with additional drainage, but with this over 24-hour period after the drainage, there was not satisfactory improvement in the chest x-ray.  For this reason, it  was recommended to the patient that we proceed with left video-assisted thoracoscopy and drainage of the pleural space.  Her respiratory status was relatively good.  CT scan did not show significant infiltrates in the right lung.  Risks and options were  discussed with her and she was agreeable and signed informed consent, taken, appropriate COVID precautions.    DESCRIPTION OF PROCEDURE:  Taken the patient directly to the operating room with appropriate PPE, placing lines in the OR.  She then underwent general endotracheal anesthesia with a double lumen endotracheal tube, turned in lateral decubitus position  with the left side up.  The left side had preoperatively been marked.  Left chest was prepped with Betadine, draped in a sterile manner.  Approximately the 6th intercostal space anterior axillary line, Exparel was infiltrated and then a  small incision  was made and the chest was entered with the Optiview port and a 0-degree scope.  As we entered into the pleural space, it was obvious purulent material present.  We then switched to a 30-degree scope and then under direct vision, placed a second 12 mm  port more posteriorly into the chest.  Between these 2 ports, we evacuated with a suction, 1250 mL of purulent material.  Following this, we then removed as much of the fibrinous purulent material that was possible and then irrigated the chest cavity  with 1.5 L of fluid.  This significantly improved the appearance on the scope and with what appeared to be adequate drainage, we then placed a 28 Blake drain through the posterior port and a standard Argyle 28 chest tube through the anterior port.  Tubes  were secured.  The lung was reinflated.  The patient was then awakened in the operating room and extubated.  She was recovered in the operating room until isolation room was available postoperatively.  Sponge and needle count was reported as correct at  completion of procedure.  The patient tolerated the procedure without obvious complication.  Blood loss was minimal.  As noted, there was 1250 mL of purulent material removed from the left chest.  IN/NUANCE  D:04/06/2020 T:04/07/2020 JOB:[id number]

## 2020-04-07 NOTE — Progress Notes (Signed)
Patient transferred from [facility] to [phone] via bed.  Patient is alert and oriented x 4, complaining of severe pain in L ribs at chest tube insertion site despite PRN pain medicine being given by [facility] nurse recently.  R midline catheter and L IJ both saline locked. Skin intact. Oriented patient to room and unit, instructed how to use the call bell and fall risk precautions.   Patient arrived with clothes, upper dentures, a phone and phone charger, and a pack of cigarettes in coat pocket.  Patient declines any further needs at this time.

## 2020-04-07 NOTE — Progress Notes (Signed)
Pt was transferred to 5W34. VS stable. Pt belonging with the patient. RN is aware about cigarettes in the pocket

## 2020-04-07 NOTE — Progress Notes (Signed)
PCCM progress note  Chart review reveals no further critical care/pulmonary needs at this time.  Confirmed with primary attending Dr. Randol Kern that our team is no longer needed.  Cardiothoracic surgery is managing chest tube.Marland Kitchen  PCCM will sign off. Thank you for the opportunity to participate in this patient's care. Please contact if we can be of further assistance.  Delfin Gant, NP-C [facility] Contact / Pager information can be found on Amion  04/07/2020, 7:52 AM

## 2020-04-08 ENCOUNTER — Inpatient Hospital Stay (HOSPITAL_COMMUNITY): Payer: Medicaid Other

## 2020-04-08 DIAGNOSIS — E43 Unspecified severe protein-calorie malnutrition: Secondary | ICD-10-CM | POA: Insufficient documentation

## 2020-04-08 LAB — COMPREHENSIVE METABOLIC PANEL
ALT: 14 U/L (ref 0–44)
AST: 12 U/L — ABNORMAL LOW (ref 15–41)
Albumin: 1.4 g/dL — ABNORMAL LOW (ref 3.5–5.0)
Alkaline Phosphatase: 41 U/L (ref 38–126)
Anion gap: 6 (ref 5–15)
BUN: 9 mg/dL (ref 6–20)
CO2: 22 mmol/L (ref 22–32)
Calcium: 6.7 mg/dL — ABNORMAL LOW (ref 8.9–10.3)
Chloride: 108 mmol/L (ref 98–111)
Creatinine, Ser: 0.49 mg/dL (ref 0.44–1.00)
GFR, Estimated: >60 mL/min (ref >60)
Glucose, Bld: 87 mg/dL (ref 70–99)
Potassium: 3.2 mmol/L — ABNORMAL LOW (ref 3.5–5.1)
Sodium: 136 mmol/L (ref 135–145)
Total Bilirubin: 0.3 mg/dL (ref 0.3–1.2)
Total Protein: 4.2 g/dL — ABNORMAL LOW (ref 6.5–8.1)

## 2020-04-08 LAB — GLUCOSE, CAPILLARY
Glucose-Capillary: 107 mg/dL — ABNORMAL HIGH (ref 70–99)
Glucose-Capillary: 108 mg/dL — ABNORMAL HIGH (ref 70–99)
Glucose-Capillary: 96 mg/dL (ref 70–99)
Glucose-Capillary: 109 mg/dL — ABNORMAL HIGH (ref 70–99)

## 2020-04-08 LAB — CULTURE, BLOOD (ROUTINE X 2)
Culture: NO GROWTH
Special Requests: ADEQUATE

## 2020-04-08 LAB — CBC
HCT: 27.7 % — ABNORMAL LOW (ref 36.0–46.0)
Hemoglobin: 9.4 g/dL — ABNORMAL LOW (ref 12.0–15.0)
MCH: 28.1 pg (ref 26.0–34.0)
MCHC: 33.9 g/dL (ref 30.0–36.0)
MCV: 82.9 fL (ref 80.0–100.0)
Platelets: 505 10*3/uL — ABNORMAL HIGH (ref 150–400)
RBC: 3.34 MIL/uL — ABNORMAL LOW (ref 3.87–5.11)
RDW: 14.1 % (ref 11.5–15.5)
WBC: 12.4 10*3/uL — ABNORMAL HIGH (ref 4.0–10.5)
nRBC: 0 % (ref 0.0–0.2)

## 2020-04-08 MED ORDER — ZOLPIDEM TARTRATE 5 MG PO TABS
2.5000 mg | ORAL_TABLET | Freq: Every evening | ORAL | Status: DC | PRN
  Administered 2020-04-09 – 2020-04-28 (×17): 2.5 mg via ORAL
  Filled 2020-04-08 (×18): qty 1

## 2020-04-08 MED ORDER — POTASSIUM CHLORIDE 10 MEQ/100ML IV SOLN
10.0000 meq | INTRAVENOUS | Status: AC
  Administered 2020-04-08 (×2): 10 meq via INTRAVENOUS
  Filled 2020-04-08 (×2): qty 100

## 2020-04-08 MED ORDER — OXYCODONE HCL 5 MG PO TABS
5.0000 mg | ORAL_TABLET | ORAL | Status: DC | PRN
  Administered 2020-04-08 – 2020-04-19 (×26): 5 mg via ORAL
  Filled 2020-04-08 (×29): qty 1

## 2020-04-08 MED ORDER — POTASSIUM CHLORIDE CRYS ER 20 MEQ PO TBCR
40.0000 meq | EXTENDED_RELEASE_TABLET | Freq: Once | ORAL | Status: AC
  Administered 2020-04-08: 40 meq via ORAL
  Filled 2020-04-08: qty 2

## 2020-04-08 MED ORDER — HYDROMORPHONE HCL 1 MG/ML IJ SOLN
0.5000 mg | Freq: Four times a day (QID) | INTRAMUSCULAR | Status: DC | PRN
  Administered 2020-04-08 – 2020-04-10 (×8): 0.5 mg via INTRAVENOUS
  Filled 2020-04-08 (×9): qty 0.5

## 2020-04-08 MED ORDER — HYDROMORPHONE HCL 1 MG/ML IJ SOLN
1.0000 mg | Freq: Once | INTRAMUSCULAR | Status: AC
  Administered 2020-04-08: 1 mg via INTRAVENOUS
  Filled 2020-04-08: qty 1

## 2020-04-08 NOTE — Progress Notes (Addendum)
Patient complained of pain at chest tube site. RN noted chart showed that suction  had been increased to . RN informed on duty DO and DO informed RN to decrease suction back to 20 mmHg.

## 2020-04-08 NOTE — Plan of Care (Signed)

## 2020-04-08 NOTE — Progress Notes (Signed)
Patient complaint of increased pain not being relieved by PRN pain medications and noticeable anxiety. Chest tube with small air leak on collection device, no subq emphysema, dressing clean and dry w/ no sign of bleeding. When oscillating lungs, very coarse and wet sounding on left side. Performed COWS that resulted a mild 6, RR was 30s and o2 sat maintaining 95% on room air, HR sinus tach 118bpm at time of assessment causing red MEWS. Was concerned of potential complication and paged oncall provide and called the rapid response nurse to come assess. Rapid nurse Mindy found no issue with chest tube after assessment of patient and device. Received orders from provider (see MAR for Ativan, Toradol). After ordered medications administered, patient was able rest with some pain relief. HR and RR better controlled. Will continue to monitor.

## 2020-04-08 NOTE — Significant Event (Signed)
Rapid Response Event Note   Reason for Call :  Asked to evaluate chest tubes d/t "gurgling" sound heard when listening to lungs.   Per RN, pt in no distress and VSS.   Initial Focused Assessment:  Pt laying in bed in no distress. Pt alert and oriented, denies CP/SOB. Sucking/gurgling sounds heard when L lung auscultated. Suction on chest tube removed momentarily and lungs auscultated again. Sucking sound now gone and lungs diminished t/o. Pt has 2 L sided CTs to sx, +air leak noted. Sites are WNL. Skin warm and dry.  T-97.9, HR-104, BP-112/76, RR-22, SpO2-96% on RA.   Interventions:  No RRT interventions needed.  Plan of Care:  Gurgling sound heard was from suction of chest tube. Pt stable. Please call RRT if further assistance needed.    Event Summary:   MD Notified:  Call Time:2011 Arrival Time:2130 End Time:2140  Terrilyn Saver, RN

## 2020-04-08 NOTE — Progress Notes (Signed)
[facility]                                                                              Patient Demographics  Alyssa Vargas, is a 34 y.o. female, DOB - 1985-10-23, NKN:[id number]  Admit date - 04/01/2020   Admitting Physician Courage Mariea Clonts, MD  Outpatient Primary MD for the patient is Patient, No Pcp Per  Outpatient specialists:   LOS - 6  days   Medical records reviewed and are as summarized below:    Chief Complaint  Patient presents with  . Covid Exposure       Brief summary   34 yo WF with H/o IVDU (iv Heroin and iv Methamphetamine) and Tobacco abuse admitted on 04/02/2020 to [facility] with dyspnea, productive cough, loss of sense of taste and smell, anorexia, generalized aches, pains, malaise fatigue and myalgias and found to be positive for COVID-19 infection with chest x-ray consistent with very very large left-sided pleural effusion with mediastinal shift,-left-sided thoracentesis on 04/02/2020 you did purulent fluid consistent with empyema with Peptococcus pneumonia, she had CT guided chest tube insertion by IR as well, she was seen by CT surgery for which she had VATS with empyema drainage and decortication on 04/05/2020.  Subjective:   Alyssa Vargas was seen and examined today, ports pain at the chest tube insertion site.   Assessment & Plan   Massive left-sided empyema lung with mediastinal shift -Large left-sided empyema, CT surgery, Dr. Tyrone Sage was consulted by Dr Mariea Clonts, recommended IR to place large bore chest tube to drain the purulent/empyema fluid. -Presently on IV Zosyn and vancomycin, this has been noted to Rocephin once culture were available and showing Streptococcus pneumonia -Status post left-sided thoracentesis on 12/17, 1.88 L purulent appearing fluid removed, CT surgery input greatly appreciated, upon further evaluation decision has been made to proceed with a VATS surgery, . -Status post 14 French drain placement in the left pleural  empyema by IR on 12/18. - VATS with empyema drainage and decortication on 04/05/2020. -She is currently with pneumothorax, with 2 chest tube, management per CT surgery. -History of management per CT surgery, chest tube remains for now, repeat chest x-ray in a.m., chest tubes are to suction and there is a small air leak.   Acute hypoxic respiratory failure  -Multifactorial due to acute COVID-19 viral pneumonia and due to large left-sided empyema -recieved  IV steroids for COVID-19 of pneumonia, has rapidly tapered R hypoxia thought to be mainly due to empyema. -Resolved, on room air -Treated with Remdesivir.  COVID-19 infection -treated  with IV steroids and Remdesivir  Lab Results  Component Value Date   SARSCOV2NAA POSITIVE (A) 04/01/2020     Recent Labs  Lab 04/02/20 0158 04/03/20 0008 04/04/20 0415 04/05/20 0342 04/07/20 0549 04/08/20 0051  DDIMER 1.69* 1.52* 1.41* 1.85*  --   --   FERRITIN  --  249 264 191  --   --   CRP  --  6.1* 1.9* 1.0*  --   --   ALT 41 38 30 21 16 14     History of IV drug use (IV heroin, methamphetamine) -Patient reported she last used  IV methamphetamine and heroin on Sunday, 03/28/20 -Follow closely for any withdrawals  Depression, anorexia -Denies suicidal or homicidal ideation or plan -Continue BuSpar   Hyponatremia -Resolved with hydration.  Tobacco use -Nicotine patch placed  Protein calorie malnutrition -Continue with supplements    Code Status: Full CODE STATUS DVT Prophylaxis: Subcu Lovenox Family Communication: Discussed all imaging results, lab results, explained to the patient  Disposition Plan:     Status is: Inpatient  Remains inpatient appropriate because:Inpatient level of care appropriate due to severity of illness   Dispo: The patient is from: Home              Anticipated d/c is to: Home              Anticipated d/c date is: > 3 days              Patient currently is not medically stable to d/c.  Will need  chest tube placement, on active Covid treatment       Procedures:  -Status post left-sided thoracentesis on 12/17, 1.88 L purulent appearing fluid removed, Gram stain with GPC, sensitivities still pending -Status post 14 French drain placement in the left pleural empyema by IR on 12/18.  Consultants:   PCCM CT surgery Intervention radiology  Antimicrobials:   Anti-infectives (From admission, onward)   Start     Dose/Rate Route Frequency Ordered Stop   04/06/20 1045  cefTRIAXone (ROCEPHIN) 2 g in sodium chloride 0.9 % 100 mL IVPB        2 g 200 mL/hr over 30 Minutes Intravenous Every 24 hours 04/06/20 0945     04/05/20 1100  cefTRIAXone (ROCEPHIN) 2 g in sodium chloride 0.9 % 100 mL IVPB  Status:  Discontinued        2 g 200 mL/hr over 30 Minutes Intravenous Every 24 hours 04/05/20 1023 04/05/20 1453   04/03/20 1600  vancomycin (VANCOREADY) IVPB 750 mg/150 mL  Status:  Discontinued        750 mg 150 mL/hr over 60 Minutes Intravenous Every 12 hours 04/03/20 1500 04/05/20 1020   04/03/20 1000  remdesivir 100 mg in sodium chloride 0.9 % 100 mL IVPB  Status:  Discontinued       "Followed by" Linked Group Details   100 mg 200 mL/hr over 30 Minutes Intravenous Daily 04/02/20 0654 04/02/20 0656   04/03/20 1000  remdesivir 100 mg in sodium chloride 0.9 % 100 mL IVPB       "Followed by" Linked Group Details   100 mg 200 mL/hr over 30 Minutes Intravenous Daily 04/02/20 0658 04/06/20 0948   04/03/20 0400  vancomycin (VANCOREADY) IVPB 500 mg/100 mL  Status:  Discontinued        50 0 mg 100 mL/hr over 60 Minutes Intravenous Every 12 hours 04/02/20 1459 04/03/20 1500   04/02/20 2200  piperacillin-tazobactam (ZOSYN) IVPB 3.375 g  Status:  Discontinued        3.375 g 12.5 mL/hr over 240 Minutes Intravenous Every 8 hours 04/02/20 1332 04/05/20 1023   04/02/20 1515  vancomycin (VANCOREADY) IVPB 1250 mg/250 mL  Status:  Discontinued        1,250 mg 166.7 mL/hr over 90 Minutes Intravenous  Every 24 hours 04/02/20 1449 04/02/20 1449   04/02/20 1515  vancomycin (VANCOREADY) IVPB 1250 mg/250 mL        1,250 mg 166.7 mL/hr over 90 Minutes Intravenous  Once 04/02/20 1449 04/02/20 1701   04/02/20 1345  piperacillin-tazobactam (ZOSYN) IVPB 3.375 g  3.375 g 100 mL/hr over 30 Minutes Intravenous  Once 04/02/20 1332 04/02/20 1434   04/02/20 0900  remdesivir 100 mg in sodium chloride 0.9 % 100 mL IVPB       "Followed by" Linked Group Details   100 mg 200 mL/hr over 30 Minutes Intravenous  Once 04/02/20 0658 04/02/20 1251   04/02/20 0800  remdesivir 100 mg in sodium chloride 0.9 % 100 mL IVPB       "Followed by" Linked Group Details   100 mg 200 mL/hr over 30 Minutes Intravenous  Once 04/02/20 0658 04/02/20 0853   04/02/20 0700  remdesivir 200 mg in sodium chloride 0.9% 250 mL IVPB  Status:  Discontinued       "Followed by" Linked Group Details   200 mg 580 mL/hr over 30 Minutes Intravenous Once 04/02/20 0654 04/02/20 0656         Medications  Scheduled Meds: . acetaminophen  650 mg Oral Q6H   Or  . acetaminophen (TYLENOL) oral liquid 160 mg/5 mL  650 mg Oral Q6H  . albuterol  2 puff Inhalation Q6H  . bisacodyl  10 mg Oral Daily  . busPIRone  10 mg Oral TID  . Chlorhexidine Gluconate Cloth  6 each Topical Daily  . dextromethorphan-guaiFENesin  1 tablet Oral BID  . enoxaparin (LOVENOX) injection  40 mg Subcutaneous Q24H  . feeding supplement  237 mL Oral TID BM  . insulin aspart  0-5 Units Subcutaneous QHS  . insulin aspart  0-9 Units Subcutaneous TID WC  . lidocaine  1 patch Transdermal Q24H  . multivitamin with minerals  1 tablet Oral Daily  . nicotine  14 mg Transdermal Daily  . pantoprazole  40 mg Oral Daily  . polyethylene glycol  34 g Oral BID  . senna-docusate  1 tablet Oral QHS  . traZODone  100 mg Oral QHS   Continuous Infusions: . sodium chloride    . sodium chloride 10 mL/hr at 04/07/20 1600  . cefTRIAXone (ROCEPHIN)  IV 2 g (04/08/20 1027)    PRN Meds:.Place/Maintain arterial line **AND** sodium chloride, albuterol, ALPRAZolam, guaiFENesin-dextromethorphan, HYDROmorphone (DILAUDID) injection, hydrOXYzine, ketorolac, ondansetron (ZOFRAN) IV, oxyCODONE       Objective:   Vitals:   04/08/20 0400 04/08/20 0811 04/08/20 1202 04/08/20 1211  BP: 111/84 (!) 114/96 106/76 106/76  Pulse: (!) 101 100 (!) 119 (!) 108  Resp: 18 18 (!) 26 18  Temp: 98.2 F (36.8 C) 98 F (36.7 C) 97.7 F (36.5 C) 97.7 F (36.5 C)  TempSrc: Oral Oral Oral Oral  SpO2: 96% 97% 95% 98%  Weight:      Height:        Intake/Output Summary (Last 24 hours) at 04/08/2020 1553 Last data filed at 04/08/2020 1226 Gross per 24 hour  Intake 449.99 ml  Output 290 ml  Net 159.99 ml     Wt Readings from Last 3 Encounters:  04/02/20 50.2 kg  05/27/19 58.1 kg  05/26/19 58.1 kg     Exam  Awake Alert, Oriented X 3, No new F.N deficits, Normal affect Symmetrical Chest wall movement, diminished air entry in the left lung, to chest tube inserted, RRR +ve B.Sounds, Abd Soft, No tenderness, No rebound - guarding or rigidity. No Cyanosis, Clubbing or edema, No new Rash or bruise       Data Reviewed:  I have personally reviewed following labs and imaging studies  Micro Results Recent Results (from the past 240 hour(s))  Resp Panel by RT-PCR (Flu  A&B, Covid) Nasopharyngeal Swab     Status: Abnormal   Collection Time: 04/01/20 10:42 PM   Specimen: Nasopharyngeal Swab; Nasopharyngeal(NP) swabs in vial transport medium  Result Value Ref Range Status   SARS Coronavirus 2 by RT PCR POSITIVE (A) NEGATIVE Final    Comment: RESULT CALLED TO, READ BACK BY AND VERIFIED WITH: T WALKER,RN@2337  04/01/20 MKELLY (NOTE) SARS-CoV-2 target nucleic acids are DETECTED.  The SARS-CoV-2 RNA is generally detectable in upper respiratory specimens during the acute phase of infection. Positive results are indicative of the presence of the identified virus, but do not  rule out bacterial infection or co-infection with other pathogens not detected by the test. Clinical correlation with patient history and other diagnostic information is necessary to determine patient infection status. The expected result is Negative.  Fact Sheet for Patients: BloggerCourse.com  Fact Sheet for Healthcare Providers: SeriousBroker.it  This test is not yet approved or cleared by the [place] FDA and  has been authorized for detection and/or diagnosis of SARS-CoV-2 by FDA under an Emergency Use Authorization (EUA).  This EUA will remain in effect (meaning this test can be  used) for the duration of  the COVID-19 declaration under Section 564(b)(1) of the Act, 21 U.S.C. section 360bbb-3(b)(1), unless the authorization is terminated or revoked sooner.     Influenza A by PCR NEGATIVE NEGATIVE Final   Influenza B by PCR NEGATIVE NEGATIVE Final    Comment: (NOTE) The Xpert Xpress SARS-CoV-2/FLU/RSV plus assay is intended as an aid in the diagnosis of influenza from Nasopharyngeal swab specimens and should not be used as a sole basis for treatment. Nasal washings and aspirates are unacceptable for Xpert Xpress SARS-CoV-2/FLU/RSV testing.  Fact Sheet for Patients: BloggerCourse.com  Fact Sheet for Healthcare Providers: SeriousBroker.it  This test is not yet approved or cleared by the [place] FDA and has been authorized for detection and/or diagnosis of SARS-CoV-2 by FDA under an Emergency Use Authorization (EUA). This EUA will remain in effect (meaning this test can be used) for the duration of the COVID-19 declaration under Section 564(b)(1) of the Act, 21 U.S.C. section 360bbb-3(b)(1), unless the authorization is terminated or revoked.  Performed at [facility], [address], [place], [place] [address]   Gram stain     Status: None   Collection Time:  04/02/20 11:40 AM   Specimen: Pleura; Body Fluid  Result Value Ref Range Status   Specimen Description PLEURAL  Final   Special Requests NONE  Final   Gram Stain   Final    GRAM POSITIVE COCCI Gram Stain Report Called to,Read Back By and Verified With: EASTER,T@1417  by MATTHEWS, B 12.17.21 WBC PRESENT,BOTH PMN AND MONONUCLEAR Performed at [facility], [address], [place], [place] [address]    Report Status 04/02/2020 FINAL  Final  Culture, body fluid-bottle     Status: Abnormal   Collection Time: 04/02/20 11:40 AM   Specimen: Pleura  Result Value Ref Range Status   Specimen Description   Final    PLEURAL Performed at [facility], [address], [place], [place] [address]    Special Requests   Final    NONE Performed at [facility], [address] [place], [place] [address]    Gram Stain   Final    GRAM POSITIVE COCCI AEROBIC BOTTLE Gram Stain Report Called to,Read Back By and Verified With: EASTER,T ON 04/02/20 AT 2040 BY LOY,C Performed at [facility] ANAEROBIC BOTTLE ALSO Performed at [facility], [address], [place], [place] [address]    Culture STREPTOCOCCUS PNEUMONIAE (A)  Final   Report Status 04/05/2020 FINAL  Final   Organism ID, Bacteria STREPTOCOCCUS PNEUMONIAE  Final      Susceptibility   Streptococcus pneumoniae - MIC*    ERYTHROMYCIN <=0.12 SENSITIVE Sensitive     LEVOFLOXACIN 1 SENSITIVE Sensitive     VANCOMYCIN 0.5 SENSITIVE Sensitive     PENICILLIN (meningitis) <=0.06 SENSITIVE Sensitive     PENO - penicillin <=0.06      PENICILLIN (non-meningitis) <=0.06 SENSITIVE Sensitive     PENICILLIN (oral) <=0.06 SENSITIVE Sensitive     CEFTRIAXONE (non-meningitis) <=0.12 SENSITIVE Sensitive     CEFTRIAXONE (meningitis) <=0.12 SENSITIVE Sensitive     * STREPTOCOCCUS PNEUMONIAE  Culture, blood (Routine X 2) w Reflex to ID Panel     Status: None   Collection Time: 04/02/20  2:48 PM   Specimen: BLOOD  Result Value Ref Range Status   Specimen  Description BLOOD LEFT ANTECUBITAL  Final   Special Requests   Final    BOTTLES DRAWN AEROBIC AND ANAEROBIC Blood Culture adequate volume   Culture   Final    NO GROWTH 5 DAYS Performed at [facility], [address] [place], [place] [address]    Report Status 04/08/2020 FINAL  Final  Culture, blood (Routine X 2) w Reflex to ID Panel     Status: None   Collection Time: 04/02/20  9:54 PM   Specimen: BLOOD  Result Value Ref Range Status   Specimen Description BLOOD RIGHT HAND  Final   Special Requests   Final    BOTTLES DRAWN AEROBIC ONLY Blood Culture adequate volume   Culture   Final    NO GROWTH 5 DAYS Performed at [facility], [address] [place], [place] [address]    Report Status 04/07/2020 FINAL  Final  Culture, blood (Routine X 2) w Reflex to ID Panel     Status: None   Collection Time: 04/02/20  9:54 PM   Specimen: BLOOD  Result Value Ref Range Status   Specimen Description BLOOD RIGHT HAND  Final   Special Requests   Final    BOTTLES DRAWN AEROBIC ONLY Blood Culture adequate volume   Culture   Final    NO GROWTH 5 DAYS Performed at [facility], [address], [place], [place] [address]    Report Status 04/07/2020 FINAL  Final  MRSA PCR Screening     Status: None   Collection Time: 04/03/20  7:11 AM   Specimen: Nasal Mucosa; Nasopharyngeal  Result Value Ref Range Status   MRSA by PCR NEGATIVE NEGATIVE Final    Comment:        The GeneXpert MRSA Assay (FDA approved for NASAL specimens only), is one component of a comprehensive MRSA colonization surveillance program. It is not intended to diagnose MRSA infection nor to guide or monitor treatment for MRSA infections. Performed at [facility], [address] [place], [place] [address]     Radiology Reports CT Angio Chest PE W and/or Wo Contrast  Result Date: 04/02/2020 CLINICAL DATA:  COVID exposure, dyspnea, cough, malaise, positive D-dimer EXAM: CT ANGIOGRAPHY CHEST WITH CONTRAST  TECHNIQUE: Multidetector CT imaging of the chest was performed using the standard protocol during bolus administration of intravenous contrast. Multiplanar CT image reconstructions and MIPs were obtained to evaluate the vascular anatomy. CONTRAST:  OMNIPAQUE IOHEXOL 350 MG/ML SOLN COMPARISON:  None. FINDINGS: Cardiovascular: There is adequate opacification  of the pulmonary arterial tree. There is no intraluminal filling defect identified to suggest acute pulmonary embolism. The central pulmonary arteries are of normal caliber. There is marked mediastinal shift to the right. Global cardiac size within normal limits. No significant coronary artery calcification. No pericardial effusion. The thoracic aorta is unremarkable. Mediastinum/Nodes: Thyroid unremarkable. Soft tissue within the anterior mediastinum likely represents rebound thymic or residual thymic tissue. The esophagus is unremarkable. No pathologic thoracic adenopathy is identified. Lungs/Pleura: There is a massive left pleural effusion which completely fills the left hemithorax, completely collapses the left lung, and demonstrates marked mass effect upon the mediastinum with marked mediastinal shift to the right. There is interstitial gas within a portion of the left lower lobe likely representing the lateral segment of the left lower lobe which may represent necrosis of the setting of necrotizing pneumonia. Mild patchy ground-glass infiltrate within the a right upper lobe anteriorly is nonspecific, possibly infectious or inflammatory. A bilobed fluid density structure is seen within the right middle lobe measuring 2.3 x 3.6 x 1.6 cm in greatest dimension, nonspecific, but possibly the sequela of prior infection or trauma. Upper Abdomen: No acute abnormality. Musculoskeletal: No acute bone abnormality. Review of the MIP images confirms the above findings. IMPRESSION: No pulmonary embolism. Massive left pleural effusion, possibly representing a a  parapneumonic effusion or empyema, demonstrating marked mass effect upon the mediastinum with marked left right mediastinal shift. Extensive interstitial gas within the probable lateral segment of the right lower lobe suggesting parenchymal necrosis in the setting of necrotizing pneumonia. Bilobed fluid-filled structure within the right middle lobe measuring 3.6 cm possibly representing the sequela of remote trauma or inflammation. Minimal patchy infiltrate within the right upper lobe, likely infectious or inflammatory. Electronically Signed   By: Helyn Numbers MD   On: 04/02/2020 04:18   DG CHEST PORT 1 VIEW  Result Date: 04/08/2020 CLINICAL DATA:  Pneumothorax. EXAM: PORTABLE CHEST 1 VIEW COMPARISON:  04/07/2020 and prior. FINDINGS: Lateral left basilar/sub pulmonic pneumothorax is unchanged. Apically terminating dual left chest tubes. Left subclavian CVC is unchanged with tip overlying the azygos. Left lung volume loss and basilar opacities, similar prior exam. Clear right lung. IMPRESSION: Lateral left basilar/subpulmonic pneumothorax is unchanged. Indwelling apically terminating left chest tubes. Electronically Signed   By: Stana Bunting M.D.   On: 04/08/2020 09:06   DG Chest Port 1 View  Result Date: 04/07/2020 CLINICAL DATA:  Coronavirus infection.  Chest tube. EXAM: PORTABLE CHEST 1 VIEW COMPARISON:  04/06/2020 FINDINGS: Left subclavian central line tip the level of the azygos vein as seen previously. Two left chest tubes remain in place. Less pleural air than was seen yesterday. Small to moderate left pleural air collection does persist. Persistent infiltrate and volume loss in the left lung. Small amount of pleural fluid on the left. Few mild patchy infiltrates in the right lung appear similar. IMPRESSION: Less pleural air on the left than was seen yesterday. Small to moderate left pleural air collection does persist. Persistent infiltrate and volume loss in the left lung. Few mild patchy  infiltrates in the right lung appear similar. Electronically Signed   By: Paulina Fusi M.D.   On: 04/07/2020 08:09   DG CHEST PORT 1 VIEW  Result Date: 04/06/2020 CLINICAL DATA:  Chest tube status post VATS.  COVID positive. EXAM: PORTABLE CHEST 1 VIEW COMPARISON:  04/05/2020.  CT 04/02/2020. FINDINGS: Left subclavian line and 2 left chest tubes in stable position. Interim slight progression of left-sided pneumothorax. Stable left-sided pleural  thickening. Persistent left lung atelectasis/infiltrate. Persistent density noted over the right lower chest. This density was noted to be within the right lung base on prior CT. This could represent a collection of pleural fluid and or atelectasis. Heart size stable. Left chest wall and supraclavicular subcutaneous emphysema again noted. IMPRESSION: 1. Stable positioning of left subclavian line and 2 left chest tubes. Interim slight progression of left-sided pneumothorax. Left chest wall and supraclavicular subcutaneous emphysema again noted without interim change. 2. Persistent left lung atelectasis/infiltrate. Stable left-sided pleural thickening. 3. Persistent density noted the right lower chest consistent with fluid pseudotumor and or atelectasis. Electronically Signed   By: Maisie Fus  Register   On: 04/06/2020 06:42   DG Chest Port 1 View  Result Date: 04/05/2020 CLINICAL DATA:  Chest tube in place. EXAM: PORTABLE CHEST 1 VIEW COMPARISON:  04/05/2020. FINDINGS: Left IJ line noted with tip over SVC. Interval removal of left pigtail chest tube. Interval placement of 2 large bore chest tubes. Interval near complete resolution of large left pleural effusion. Small pneumothorax noted. Atelectasis/infiltrate left lung. Density noted over the right lung may be overlying the chest. Heart size normal. Left chest wall subcutaneous emphysema. IMPRESSION: 1. Interim removal of left pigtail chest tube. Interval placement of 2 large bore chest tubes with near complete  resolution of large left pleural effusion. Small left pneumothorax noted. 2. Associated atelectasis/infiltrate left lung. These results will be called to the ordering clinician or representative by the Radiologist Assistant, and communication documented in the PACS or [employer]. Electronically Signed   By: Maisie Fus  Register   On: 04/05/2020 16:26   DG Chest Port 1 View  Result Date: 04/05/2020 CLINICAL DATA:  Shortness of breath, COVID-19 positivity EXAM: PORTABLE CHEST 1 VIEW COMPARISON:  04/04/2020 FINDINGS: Cardiac shadow is obscured by the large left empyema. Right lung remains clear. Left hemithorax demonstrates opacification relatively similar to that seen on the prior exam. Pigtail catheter is noted in place. Slight improved aeration in the left upper lobe is noted. No bony abnormality is seen. IMPRESSION: Slight improved aeration on the left. Pigtail catheter remains in place with relatively CT stable left empyema. Electronically Signed   By: Alcide Clever M.D.   On: 04/05/2020 09:04   DG Chest Port 1 View  Result Date: 04/04/2020 CLINICAL DATA:  Shortness of breath.  History of COVID. EXAM: PORTABLE CHEST 1 VIEW COMPARISON:  Chest radiograph 04/02/2020. FINDINGS: Monitoring leads overlie the patient. Cardiac and mediastinal contours largely obscured. Interval insertion left chest tube. Interval decrease in size of left pleural fluid within the upper hemithorax and mid hemithorax. Similar-appearing nodular opacity right mid lung. IMPRESSION: 1. Interval insertion left chest tube with interval decrease in size of left pleural fluid. 2. Similar-appearing nodular opacity right mid lung. Electronically Signed   By: Annia Belt M.D.   On: 04/04/2020 10:46   DG Chest Portable 1 View  Result Date: 04/02/2020 CLINICAL DATA:  Large LEFT pleural effusion EXAM: PORTABLE CHEST 1 VIEW COMPARISON:  Portable exam at 1203 compared to 0227 hrs FINDINGS: Resolution of previously identified LEFT to RIGHT  mediastinal shift. Unable to assess heart size due to subtotal opacification of the LEFT hemithorax. Minimal aeration now seen in LEFT lung. No pneumothorax. Nodular foci at inferior RIGHT lung again seen with minimal RIGHT basilar atelectasis. Osseous structures unremarkable. IMPRESSION: No pneumothorax following LEFT thoracentesis. Resolution of LEFT to RIGHT mediastinal shift. Persistent nodular foci at inferior RIGHT hemithorax. Electronically Signed   By: Angelyn Punt.D.  On: 04/02/2020 12:32   DG Chest Port 1 View  Result Date: 04/02/2020 CLINICAL DATA:  COVID, shortness of breath EXAM: PORTABLE CHEST 1 VIEW COMPARISON:  None. FINDINGS: Complete whiteout of the left hemithorax, likely related to a combination of effusion and airspace disease. Heart and mediastinal structures are shifted to the right. Right mid lung patchy airspace disease. No effusion on the right. No acute bony abnormality. IMPRESSION: IMPRESSION Complete opacification of the left hemithorax, likely related to effusion and airspace disease. Patchy right mid lung airspace disease. Electronically Signed   By: Charlett NoseKevin  Dover M.D.   On: 04/02/2020 02:48   [facility]  Result Date: 04/03/2020 CLINICAL DATA:  Large left pleural empyema, COVID-19 infection and history of IV drug use. Request for percutaneous thoracostomy tube placement to drain the empyema. EXAM: CT GUIDED CATHETER DRAINAGE OF LEFT PLEURAL EMPYEMA ANESTHESIA/SEDATION: 2.0 mg IV Versed 100 mcg IV Fentanyl Total Moderate Sedation Time:  10 minutes The patient's level of consciousness and physiologic status were continuously monitored during the procedure by Radiology nursing. PROCEDURE: The procedure, risks, benefits, and alternatives were explained to the patient. Questions regarding the procedure were encouraged and answered. The patient understands and consents to the procedure. A time out was performed prior to initiating the  procedure. CT was performed through the chest in a supine position. The left chest wall was prepped with chlorhexidine in a sterile fashion, and a sterile drape was applied covering the operative field. A sterile gown and sterile gloves were used for the procedure. Local anesthesia was provided with 1% Lidocaine. Under CT guidance, an 18 gauge trocar needle was advanced into the left pleural space from an anterolateral approach. After confirming needle tip position, fluid was aspirated. A guidewire was advanced and the needle removed. The percutaneous tract was dilated and a 14 French pigtail drainage catheter advanced into the left pleural space. Catheter position was confirmed by CT. The pigtail catheter was connected to a Pleur-evac device. The catheter was secured at the skin with a Prolene retention suture, StatLock device and dressed with a Vaseline gauze dressing. COMPLICATIONS: None FINDINGS: A large amount of residual left pleural fluid remains despite large volume thoracentesis yesterday. Aspiration from the pleural space yielded grossly purulent fluid. Repeat laboratory analysis was not performed as the fluid removed at the time of thoracentesis yesterday was sent for laboratory testing. After placement of the 14 French drainage catheter, there is good return of purulent fluid. The Pleur-evac will be connected to wall suction at -20 cm of water when the patient returns to her hospital room. IMPRESSION: CT-guided placement of 14 French left chest tube. A 14 French drainage catheter was placed with return of purulent fluid. The drainage catheter was attached to a Pleur-evac device which will be attached to wall suction. Electronically Signed   By: Irish LackGlenn  Yamagata M.D.   On: 04/03/2020 13:33   ECHOCARDIOGRAM LIMITED  Result Date: 04/03/2020    ECHOCARDIOGRAM LIMITED REPORT   Patient Name:   Alyssa Vargas Date of Exam: 04/03/2020 Medical Rec #:  [id number]     Height:       67.0 in Accession #:     [id number]    Weight:       110.7 lb Date of Birth:  November 22, 1985     BSA:          1.573 m Patient Age:    34 years      BP:  109/79 mmHg Patient Gender: F             HR:           99 bpm. Exam Location:  Inpatient Procedure: Limited Echo, Limited Color Doppler and Cardiac Doppler Indications:    endocarditis  History:        Patient has no prior history of Echocardiogram examinations.                 Covid. Pleural effusion.; Risk Factors:Current Smoker and IV                 drug use.  Sonographer:    Delcie Roch Referring Phys: [id number] COURAGE EMOKPAE  Sonographer Comments: Image acquisition challenging due to respiratory motion. IMPRESSIONS  1. Left ventricular ejection fraction, by estimation, is 60 to 65%. The left ventricle has normal function.  2. Right ventricular systolic function is normal. The right ventricular size is normal.  3. A small pericardial effusion is present. The pericardial effusion is circumferential.  4. The mitral valve is normal in structure. No evidence of mitral valve regurgitation. No evidence of mitral stenosis.  5. The aortic valve was not well visualized. Aortic valve regurgitation is not visualized. No aortic stenosis is present.  6. The inferior vena cava is normal in size with greater than 50% respiratory variability, suggesting right atrial pressure of 3 mmHg. FINDINGS  Left Ventricle: Left ventricular ejection fraction, by estimation, is 60 to 65%. The left ventricle has normal function. Right Ventricle: The right ventricular size is normal. Right vetricular wall thickness was not well visualized. Right ventricular systolic function is normal. Pericardium: A small pericardial effusion is present. The pericardial effusion is circumferential. Mitral Valve: The mitral valve is normal in structure. No evidence of mitral valve stenosis. Tricuspid Valve: The tricuspid valve is normal in structure. Tricuspid valve regurgitation is not demonstrated. No evidence of  tricuspid stenosis. Aortic Valve: The aortic valve was not well visualized. Aortic valve regurgitation is not visualized. No aortic stenosis is present. Pulmonic Valve: The pulmonic valve was normal in structure. Pulmonic valve regurgitation is not visualized. No evidence of pulmonic stenosis. Venous: The inferior vena cava is normal in size with greater than 50% respiratory variability, suggesting right atrial pressure of 3 mmHg. LEFT VENTRICLE PLAX 2D LVIDd:         3.90 cm LVIDs:         2.30 cm LV IVS:        0.70 cm LVOT diam:     1.60 cm LVOT Area:     2.01 cm  IVC IVC diam: 1.90 cm LEFT ATRIUM         Index LA diam:    2.10 cm 1.34 cm/m   AORTA Ao Root diam: 2.20 cm MITRAL VALVE MV Area (PHT): 5.23 cm    SHUNTS MV Decel Time: 145 msec    Systemic Diam: 1.60 cm MV E velocity: 88.70 cm/s MV A velocity: 61.70 cm/s MV E/A ratio:  1.44 Dina Rich MD Electronically signed by Dina Rich MD Signature Date/Time: 04/03/2020/2:31:28 PM    Final    US THORACENTESIS ASP PLEURAL SPACE W/IMG GUIDE  Result Date: 04/02/2020 INDICATION: Large LEFT pleural effusion EXAM: ULTRASOUND GUIDED DIAGNSOTIC AND THERAPEUTIC LEFT THORACENTESIS MEDICATIONS: None. COMPLICATIONS: None immediate. PROCEDURE: Procedure, benefits, and risks of procedure were discussed with patient. Written informed consent for procedure was obtained. Time out protocol followed. Pleural effusion localized by ultrasound at the posterior LEFT hemithorax. Fluid is markedly complex  containing diffuse internal echogenicity as well as dependent echogenicity. Skin prepped and draped in usual sterile fashion. Skin and soft tissues anesthetized with 10 mL of 1% lidocaine. 8 French thoracentesis catheter placed into the LEFT pleural space. 1.88 L of complex cloudy tan fluid with particulates with purulent appearance aspirated by syringe pump. Findings most likely represent empyema. Procedure tolerated well by patient without immediate complication.  FINDINGS: Complex purulent appearing LEFT pleural fluid likely representing empyema. IMPRESSION: Successful ultrasound guided LEFT thoracentesis yielding 1.88 L of pleural fluid. Electronically Signed   By: Ulyses Southward M.D.   On: 04/02/2020 12:39    Lab Data:  CBC: Recent Labs  Lab 04/02/20 0158 04/03/20 0008 04/04/20 0415 04/05/20 0342 04/06/20 0518 04/07/20 0549 04/08/20 0051  WBC 9.5 8.5 6.0 7.3 14.1* 12.1* 12.4*  NEUTROABS 6.8 4.7 3.7 5.2  --   --   --   HGB 12.3 11.2* 12.3 10.6* 10.7* 10.6* 9.4*  HCT 36.2 33.4* 38.7 32.9* 32.3* 31.4* 27.7*  MCV 84.2 82.5 85.4 86.4 85.2 84.6 82.9  PLT 558* 584* 485* 584* 585* 564* 505*   Basic Metabolic Panel: Recent Labs  Lab 04/02/20 0158 04/02/20 0728 04/03/20 0008 04/04/20 0415 04/05/20 0342 04/06/20 0518 04/07/20 0549 04/08/20 0051  NA 125*   < > 133* 136 139 136 137 136  K 3.9   < > 3.6 4.0 4.4 4.0 3.7 3.2*  CL 87*   < > 96* 101 106 103 104 108  CO2 28   < > GLUCOSE 115*   < > 107* 121* 114* 71 102* 87  BUN 15   < > 23* CREATININE 0.69   < > 0.69 0.73 0.76 0.60 0.60 0.49  CALCIUM 8.5*   < > 8.0* 7.9* 7.5* 7.7* 7.9* 6.7*  MG 1.8  --  1.9 1.8 1.7  --  1.7  --   PHOS 4.2  --  4.5 3.1 3.1  --   --   --    < > = values in this interval not displayed.   GFR: Estimated Creatinine Clearance: 80 mL/min (by C-G formula based on SCr of 0.49 mg/dL). Liver Function Tests: Recent Labs  Lab 04/03/20 0008 04/04/20 0415 04/05/20 0342 04/07/20 0549 04/08/20 0051  AST 43* 12*  ALT 38 ALKPHOS 56 47 40 46 41  BILITOT 0.5 0.5 0.3 0.4 0.3  PROT 5.7* 5.8* 5.2* 4.9* 4.2*  ALBUMIN 1.7* 1.7* 1.6* 1.6* 1.4*   No results for input(s): LIPASE, AMYLASE in the last 168 hours. No results for input(s): AMMONIA in the last 168 hours. Coagulation Profile: Recent Labs  Lab 04/03/20 0008  INR 1.1   Cardiac Enzymes: No results for input(s): CKTOTAL, CKMB, CKMBINDEX, TROPONINI in the  last 168 hours. BNP (last 3 results) No results for input(s): PROBNP in the last 8760 hours. HbA1C: No results for input(s): HGBA1C in the last 72 hours. CBG: Recent Labs  Lab 04/07/20 1201 04/07/20 1621 04/07/20 2107 04/08/20 0809 04/08/20 1214  GLUCAP 106* 112* 91 107* 108*   Lipid Profile: No results for input(s): CHOL, HDL, LDLCALC, TRIG, CHOLHDL, LDLDIRECT in the last 72 hours. Thyroid Function Tests: No results for input(s): TSH, T4TOTAL, FREET4, T3FREE, THYROIDAB in the last 72 hours. Anemia Panel: No results for input(s): VITAMINB12, FOLATE, FERRITIN, TIBC, IRON, RETICCTPCT in the last 72 hours. Urine analysis: No results found for: COLORURINE, APPEARANCEUR, LABSPEC,  PHURINE, GLUCOSEU, HGBUR, BILIRUBINUR, KETONESUR, PROTEINUR, UROBILINOGEN, NITRITE, Jac Canavan M.D. [facility] 04/08/2020, 3:53 PM   Call night coverage person covering after 7pm

## 2020-04-08 NOTE — TOC Initial Note (Signed)
Transition of Care [facility]) - Initial/Assessment Note    Patient Details  Name: Alyssa Vargas MRN: [id number] Date of Birth: 1985/12/26  [facility] CM/SW Contact:    Lockie Pares, RN Phone Number: 04/08/2020, 2:41 PM  Clinical Narrative:                 Patient from [place], [place] comes in with Empyema, had decortication and chest tube placement. Has [place] medicaid. Will have to have medications sent to pharmacy in [place] for them to be covered. . May need HH nad Home IV antibiotic CM will follow for needs.    Expected Discharge Plan: Home w Home Health Services Barriers to Discharge: Continued Medical Work up   Patient Goals and CMS Choice        Expected Discharge Plan and Services Expected Discharge Plan: Home w Home Health Services   Discharge Planning Services: CM Consult   Living arrangements for the past 2 months: Single Family Home                                      Prior Living Arrangements/Services Living arrangements for the past 2 months: Single Family Home   Patient language and need for interpreter reviewed:: Yes        Need for Family Participation in Patient Care: Yes (Comment) Care giver support system in place?: Yes (comment)   Criminal Activity/Legal Involvement Pertinent to Current Situation/Hospitalization: No - Comment as needed  Activities of Daily Living Home Assistive Devices/Equipment: None ADL Screening (condition at time of admission) Patient's cognitive ability adequate to safely complete daily activities?: Yes Is the patient deaf or have difficulty hearing?: No Does the patient have difficulty seeing, even when wearing glasses/contacts?: No Does the patient have difficulty concentrating, remembering, or making decisions?: No Patient able to express need for assistance with ADLs?: No Does the patient have difficulty dressing or bathing?: No Independently performs ADLs?: No Communication: Independent Does the patient  have difficulty walking or climbing stairs?: No Weakness of Legs: None Weakness of Arms/Hands: None  Permission Sought/Granted                  Emotional Assessment       Orientation: : Oriented to Place,Oriented to Self,Oriented to  Time,Oriented to Situation Alcohol / Substance Use: Not Applicable Psych Involvement: No (comment)  Admission diagnosis:  Empyema lung (HCC) [J86.9] Hyponatremia [E87.1] Pleural effusion [J90] Thrombocytosis [D75.839] Pleural effusion on left [J90] Pleural effusion, left [J90] Elevated AST (SGOT) [R74.01] COVID-19 virus infection [U07.1] Patient Active Problem List   Diagnosis Date Noted  . Protein-calorie malnutrition, severe 04/08/2020  . COVID-19 virus infection 04/02/2020  . Pleural effusion on left 04/02/2020  . Tobacco abuse 04/02/2020  . Hyponatremia 04/02/2020  . Hypoalbuminemia 04/02/2020  . Thrombocytosis 04/02/2020  . Elevated d-dimer 04/02/2020  . Massive left-sided empyema lung with mediastinal shift 04/02/2020  . 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 [place] [address] Phone: [phone] Fax: [phone]     Social Determinants of Health (SDOH) Interventions    Readmission Risk Interventions No flowsheet data found.

## 2020-04-08 NOTE — Progress Notes (Signed)
Pt still complaining of pain at chest tube site. DO notified. See chart for new orders.

## 2020-04-08 NOTE — Progress Notes (Addendum)
TCTS DAILY ICU PROGRESS NOTE                   [address]            [place] [address]          [phone]   3 Days Post-Op Procedure(s) (LRB): VIDEO ASSISTED THORACOSCOPY (VATS)/EMPYEMA DRAINAGE, DECORTICATION  (Left)  Total Length of Stay:  LOS: 6 days   Subjective: Patient sleeping and awakened. She is complaining of incisional and chest tube pain on the left.  Objective: Vital signs in last 24 hours: Temp:  [97.7 F (36.5 C)-98.2 F (36.8 C)] 98 F (36.7 C) (12/23 0811) Pulse Rate:  [100-114] 100 (12/23 0811) Cardiac Rhythm: Sinus tachycardia (12/23 0003) Resp:  [17-31] 18 (12/23 0811) BP: (106-142)/(65-98) 114/96 (12/23 0811) SpO2:  [94 %-100 %] 97 % (12/23 0811)  Filed Weights   04/01/20 2125 04/02/20 2220  Weight: 52.2 kg 50.2 kg      Intake/Output from previous day: 12/22 0701 - 12/23 0700 In: 423.9 [P.O.:120; I.V.:103.9; IV Piggyback:200] Out: 1290 [Urine:1150; Chest Tube:140]  Intake/Output this shift: No intake/output data recorded.  Current Meds: Scheduled Meds: . acetaminophen  650 mg Oral Q6H   Or  . acetaminophen (TYLENOL) oral liquid 160 mg/5 mL  650 mg Oral Q6H  . albuterol  2 puff Inhalation Q6H  . bisacodyl  10 mg Oral Daily  . busPIRone  10 mg Oral TID  . Chlorhexidine Gluconate Cloth  6 each Topical Daily  . dextromethorphan-guaiFENesin  1 tablet Oral BID  . enoxaparin (LOVENOX) injection  40 mg Subcutaneous Q24H  . feeding supplement  237 mL Oral TID BM  . insulin aspart  0-5 Units Subcutaneous QHS  . insulin aspart  0-9 Units Subcutaneous TID WC  . lidocaine  1 patch Transdermal Q24H  . multivitamin with minerals  1 tablet Oral Daily  . nicotine  14 mg Transdermal Daily  . pantoprazole  40 mg Oral Daily  . polyethylene glycol  34 g Oral BID  . potassium chloride  40 mEq Oral Once  . senna-docusate  1 tablet Oral QHS  . traZODone  100 mg Oral QHS   Continuous Infusions: . sodium chloride    . sodium chloride  10 mL/hr at 04/07/20 1600  . cefTRIAXone (ROCEPHIN)  IV Stopped (04/07/20 1049)   PRN Meds:.Place/Maintain arterial line **AND** sodium chloride, albuterol, ALPRAZolam, guaiFENesin-dextromethorphan, hydrOXYzine, ketorolac, ondansetron (ZOFRAN) IV, oxyCODONE  General appearance: alert, cooperative and no distress Heart: RRR Lungs: Clear on the right and diminished on the left Extremities: No LE edema Wound: Dressing is clean and dry Chest tubes: to suction,  air leak  Lab Results: CBC: Recent Labs    04/07/20 0549 04/08/20 0051  WBC 12.1* 12.4*  HGB 10.6* 9.4*  HCT 31.4* 27.7*  PLT 564* 505*   BMET:  Recent Labs    04/07/20 0549 04/08/20 0051  NA 137 136  K 3.7 3.2*  CL 104 108  CO2 25 22  GLUCOSE 102* 87  BUN 13 9  CREATININE 0.60 0.49  CALCIUM 7.9* 6.7*    CMET: Lab Results  Component Value Date   WBC 12.4 (H) 04/08/2020   HGB 9.4 (L) 04/08/2020   HCT 27.7 (L) 04/08/2020   PLT 505 (H) 04/08/2020   GLUCOSE 87 04/08/2020   ALT 14 04/08/2020   AST 12 (L) 04/08/2020   NA 136 04/08/2020   K 3.2 (L) 04/08/2020   CL 108 04/08/2020  CREATININE 0.49 04/08/2020   BUN 9 04/08/2020   CO2 22 04/08/2020   INR 1.1 04/03/2020    PT/INR: No results for input(s): LABPROT, INR in the last 72 hours. Radiology: No results found.   Assessment/Plan: S/P Procedure(s) (LRB): VIDEO ASSISTED THORACOSCOPY (VATS)/EMPYEMA DRAINAGE, DECORTICATION  (Left)   1. CV-SR with HR in the 80's 2. Pulmonary-on room air. Chest tubes with 140 cc since surgery. Chest tubes are to suction and there is a small air leak. CXR this am appears stable (Chest tubes to remain for now. Check CXR in am. Encourage incentive spirometer 3. CBGs 112/91/107. No history of DM. Per primary. 4. ID-WBC this am decreased to 12,400. On Rocephin  for Strep Pneumoniae. 5. Anemia-H and H this am decreased to 9.4 and 27.7 6. COVID 19-Previously treated with Remsdesivir and Solu Medrol 7. History of IVDU-Oxy,   Toradol PRN. Has Lidocaine patch as well 8. Supplement potassium   Donielle Margaretann Loveless PA-C 04/08/2020 8:51 AM   Leave chest tubes  I have seen and examined Alyssa Vargas and agree with the above assessment  and plan.  Delight Ovens MD Beeper [phone] Office [phone] 04/08/2020 8:51 AM

## 2020-04-09 ENCOUNTER — Inpatient Hospital Stay (HOSPITAL_COMMUNITY): Payer: Medicaid Other

## 2020-04-09 LAB — COMPREHENSIVE METABOLIC PANEL
ALT: 14 U/L (ref 0–44)
AST: 14 U/L — ABNORMAL LOW (ref 15–41)
Albumin: 1.6 g/dL — ABNORMAL LOW (ref 3.5–5.0)
Alkaline Phosphatase: 49 U/L (ref 38–126)
Anion gap: 7 (ref 5–15)
BUN: 11 mg/dL (ref 6–20)
CO2: 25 mmol/L (ref 22–32)
Calcium: 8 mg/dL — ABNORMAL LOW (ref 8.9–10.3)
Chloride: 105 mmol/L (ref 98–111)
Creatinine, Ser: 0.56 mg/dL (ref 0.44–1.00)
GFR, Estimated: >60 mL/min (ref >60)
Glucose, Bld: 116 mg/dL — ABNORMAL HIGH (ref 70–99)
Potassium: 4.3 mmol/L (ref 3.5–5.1)
Sodium: 137 mmol/L (ref 135–145)
Total Bilirubin: 0.5 mg/dL (ref 0.3–1.2)
Total Protein: 4.8 g/dL — ABNORMAL LOW (ref 6.5–8.1)

## 2020-04-09 LAB — CBC
HCT: 29.7 % — ABNORMAL LOW (ref 36.0–46.0)
Hemoglobin: 9.8 g/dL — ABNORMAL LOW (ref 12.0–15.0)
MCH: 27.9 pg (ref 26.0–34.0)
MCHC: 33 g/dL (ref 30.0–36.0)
MCV: 84.6 fL (ref 80.0–100.0)
Platelets: 480 10*3/uL — ABNORMAL HIGH (ref 150–400)
RBC: 3.51 MIL/uL — ABNORMAL LOW (ref 3.87–5.11)
RDW: 14.7 % (ref 11.5–15.5)
WBC: 9.8 10*3/uL (ref 4.0–10.5)
nRBC: 0 % (ref 0.0–0.2)

## 2020-04-09 LAB — GLUCOSE, CAPILLARY
Glucose-Capillary: 95 mg/dL (ref 70–99)
Glucose-Capillary: 100 mg/dL — ABNORMAL HIGH (ref 70–99)
Glucose-Capillary: 78 mg/dL (ref 70–99)
Glucose-Capillary: 87 mg/dL (ref 70–99)

## 2020-04-09 MED ORDER — RISAQUAD PO CAPS
2.0000 | ORAL_CAPSULE | Freq: Every day | ORAL | Status: DC
  Administered 2020-04-09 – 2020-04-30 (×21): 2 via ORAL
  Filled 2020-04-09 (×21): qty 2

## 2020-04-09 MED ORDER — SENNOSIDES-DOCUSATE SODIUM 8.6-50 MG PO TABS
2.0000 | ORAL_TABLET | Freq: Two times a day (BID) | ORAL | Status: DC
  Administered 2020-04-09 – 2020-04-25 (×13): 2 via ORAL
  Filled 2020-04-09 (×31): qty 2

## 2020-04-09 MED ORDER — BISACODYL 5 MG PO TBEC
5.0000 mg | DELAYED_RELEASE_TABLET | Freq: Every day | ORAL | Status: DC
  Administered 2020-04-11 – 2020-04-20 (×3): 5 mg via ORAL
  Filled 2020-04-09 (×6): qty 1

## 2020-04-09 MED ORDER — SODIUM CHLORIDE 0.9% FLUSH
10.0000 mL | INTRAVENOUS | Status: DC | PRN
Start: 2020-04-09 — End: 2020-04-21
  Administered 2020-04-14: 10 mL

## 2020-04-09 NOTE — Plan of Care (Signed)

## 2020-04-09 NOTE — Progress Notes (Signed)
Routine line care: pt with DL subclavian central line. Site unremarkable. RUE midline in place. If central line to remain in place, recommend removal of midline as it no longer has blood return.

## 2020-04-09 NOTE — Progress Notes (Signed)
   04/08/20 1945  Assess: MEWS Score  Temp 97.9 F (36.6 C)  BP 108/73  Pulse Rate (!) 106  ECG Heart Rate (!) 109  Resp 20  Level of Consciousness Alert  SpO2 97 %  O2 Device Room Air  Assess: MEWS Score  MEWS Temp 0  MEWS Systolic 0  MEWS Pulse 1  MEWS RR 0  MEWS LOC 0  MEWS Score 1  MEWS Score Color Green  Assess: if the MEWS score is Yellow or Red  Were vital signs taken at a resting state? Yes  Focused Assessment No change from prior assessment  Early Detection of Sepsis Score *See Row Information* Low  MEWS guidelines implemented *See Row Information* Yes  Treat  MEWS Interventions Administered scheduled meds/treatments;Administered prn meds/treatments  Take Vital Signs  Increase Vital Sign Frequency  Yellow: Q 2hr X 2 then Q 4hr X 2, if remains yellow, continue Q 4hrs (started yellow protocol due to patient continuously going into yellow MEWS)  Escalate  MEWS: Escalate Yellow: discuss with charge nurse/RN and consider discussing with provider and RRT  Notify: Charge Nurse/RN  Name of Charge Nurse/RN Notified Anna, RN  Date Charge Nurse/RN Notified 04/08/20  Time Charge Nurse/RN Notified 1945  Document  Patient Outcome Stabilized after interventions  Progress note created (see row info) Yes  Patient's HR was elevated (100's - 120's) and MEWS was flipping back and forth between green and yellow. This RN began the yellow MEWS protocol.

## 2020-04-09 NOTE — Progress Notes (Signed)
Assessed this patient and instructed on line removal. Pressure held for 5 min, instructed on monitoring and reporting s/sx of bleeding, instructed to leave drsg in place CDI for 24hrs. Pt. VU. Tomasita Morrow, RN VAST

## 2020-04-09 NOTE — Progress Notes (Signed)
[facility]                                                                              Patient Demographics  Alyssa Vargas, is a 34 y.o. female, DOB - 07-09-1985, ZOX:[id number]  Admit date - 04/01/2020   Admitting Physician Courage Mariea Clonts, MD  Outpatient Primary MD for the patient is Patient, No Pcp Per  Outpatient specialists:   LOS - 7  days   Medical records reviewed and are as summarized below:    Chief Complaint  Patient presents with  . Covid Exposure       Brief summary   34 yo WF with H/o IVDU (iv Heroin and iv Methamphetamine) and Tobacco abuse admitted on 04/02/2020 to [facility] with dyspnea, productive cough, loss of sense of taste and smell, anorexia, generalized aches, pains, malaise fatigue and myalgias and found to be positive for COVID-19 infection with chest x-ray consistent with very very large left-sided pleural effusion with mediastinal shift,-left-sided thoracentesis on 04/02/2020 you did purulent fluid consistent with empyema with Peptococcus pneumonia, she had CT guided chest tube insertion by IR as well, she was seen by CT surgery for which she had VATS with empyema drainage and decortication on 04/05/2020.  Subjective:   Alyssa Vargas was seen and examined today, she pain at chest tube insertion site is better controlled, she does report some insomnia.    Assessment & Plan   Massive left-sided empyema lung with mediastinal shift -Large left-sided empyema, CT surgery, Dr. Tyrone Sage was consulted by Dr Mariea Clonts, recommended IR to place large bore chest tube to drain the purulent/empyema fluid. -Presently on IV Zosyn and vancomycin, this has been noted to Rocephin once culture were available and showing Streptococcus pneumonia -Status post left-sided thoracentesis on 12/17, 1.88 L purulent appearing fluid removed, CT surgery input greatly appreciated, upon further evaluation decision has been made to proceed with a VATS surgery, . -Status post  14 French drain placement in the left pleural empyema by IR on 12/18. - VATS with empyema drainage and decortication on 04/05/2020. -Chest tube management per CT surgery, she remains with airleak, and drainage, so chest tube is to stay, she was encouraged to use incentive spirometer, and to get out of bed to chair .  Acute hypoxic respiratory failure  -Multifactorial due to acute COVID-19 viral pneumonia and due to large left-sided empyema -recieved  IV steroids for COVID-19 of pneumonia, has rapidly tapered R hypoxia thought to be mainly due to empyema. -Resolved, on room air -Treated with Remdesivir.  COVID-19 infection -treated  with IV steroids and Remdesivir  Lab Results  Component Value Date   SARSCOV2NAA POSITIVE (A) 04/01/2020     Recent Labs  Lab 04/03/20 0008 04/04/20 0415 04/05/20 0342 04/07/20 0549 04/08/20 0051 04/09/20 0214  DDIMER 1.52* 1.41* 1.85*  --   --   --   FERRITIN 249 264 191  --   --   --   CRP 6.1* 1.9* 1.0*  --   --   --   ALT 38 30 21 16 14 14     History of IV drug use (IV heroin, methamphetamine) -Patient reported she  last used IV methamphetamine and heroin on Sunday, 03/28/20  Depression, anorexia -Denies suicidal or homicidal ideation or plan -Continue BuSpar   Hyponatremia -Resolved with hydration.  Tobacco use -Nicotine patch placed  Protein calorie malnutrition -Continue with supplements  will discontinue her central line,   Code Status: Full CODE STATUS DVT Prophylaxis: Subcu Lovenox Family Communication: Discussed all imaging results, lab results, explained to the patient  Disposition Plan:     Status is: Inpatient  Remains inpatient appropriate because:Inpatient level of care appropriate due to severity of illness   Dispo: The patient is from: Home              Anticipated d/c is to: Home              Anticipated d/c date is: > 3 days              Patient currently is not medically stable to d/c.         Procedures:  -Status post left-sided thoracentesis on 12/17, 1.88 L purulent appearing fluid removed, Gram stain with GPC, sensitivities still pending -Status post 14 French drain placement in the left pleural empyema by IR on 12/18.  Consultants:   PCCM CT surgery Intervention radiology  Antimicrobials:   Anti-infectives (From admission, onward)   Start     Dose/Rate Route Frequency Ordered Stop   04/06/20 1045  cefTRIAXone (ROCEPHIN) 2 g in sodium chloride 0.9 % 100 mL IVPB        2 g 200 mL/hr over 30 Minutes Intravenous Every 24 hours 04/06/20 0945     04/05/20 1100  cefTRIAXone (ROCEPHIN) 2 g in sodium chloride 0.9 % 100 mL IVPB  Status:  Discontinued        2 g 200 mL/hr over 30 Minutes Intravenous Every 24 hours 04/05/20 1023 04/05/20 1453   04/03/20 1600  vancomycin (VANCOREADY) IVPB 750 mg/150 mL  Status:  Discontinued        750 mg 150 mL/hr over 60 Minutes Intravenous Every 12 hours 04/03/20 1500 04/05/20 1020   04/03/20 1000  remdesivir 100 mg in sodium chloride 0.9 % 100 mL IVPB  Status:  Discontinued       "Followed by" Linked Group Details   100 mg 200 mL/hr over 30 Minutes Intravenous Daily 04/02/20 0654 04/02/20 0656   04/03/20 1000  remdesivir 100 mg in sodium chloride 0.9 % 100 mL IVPB       "Followed by" Linked Group Details   100 mg 200 mL/hr over 30 Minutes Intravenous Daily 04/02/20 0658 04/06/20 0948   04/03/20 0400  vancomycin (VANCOREADY) IVPB 500 mg/100 mL  Status:  Discontinued        50 0 mg 100 mL/hr over 60 Minutes Intravenous Every 12 hours 04/02/20 1459 04/03/20 1500   04/02/20 2200  piperacillin-tazobactam (ZOSYN) IVPB 3.375 g  Status:  Discontinued        3.375 g 12.5 mL/hr over 240 Minutes Intravenous Every 8 hours 04/02/20 1332 04/05/20 1023   04/02/20 1515  vancomycin (VANCOREADY) IVPB 1250 mg/250 mL  Status:  Discontinued        1,250 mg 166.7 mL/hr over 90 Minutes Intravenous Every 24 hours 04/02/20 1449 04/02/20 1449    04/02/20 1515  vancomycin (VANCOREADY) IVPB 1250 mg/250 mL        1,250 mg 166.7 mL/hr over 90 Minutes Intravenous  Once 04/02/20 1449 04/02/20 1701   04/02/20 1345  piperacillin-tazobactam (ZOSYN) IVPB 3.375 g  3.375 g 100 mL/hr over 30 Minutes Intravenous  Once 04/02/20 1332 04/02/20 1434   04/02/20 0900  remdesivir 100 mg in sodium chloride 0.9 % 100 mL IVPB       "Followed by" Linked Group Details   100 mg 200 mL/hr over 30 Minutes Intravenous  Once 04/02/20 0658 04/02/20 1251   04/02/20 0800  remdesivir 100 mg in sodium chloride 0.9 % 100 mL IVPB       "Followed by" Linked Group Details   100 mg 200 mL/hr over 30 Minutes Intravenous  Once 04/02/20 0658 04/02/20 0853   04/02/20 0700  remdesivir 200 mg in sodium chloride 0.9% 250 mL IVPB  Status:  Discontinued       "Followed by" Linked Group Details   200 mg 580 mL/hr over 30 Minutes Intravenous Once 04/02/20 0654 04/02/20 0656         Medications  Scheduled Meds: . acetaminophen  650 mg Oral Q6H   Or  . acetaminophen (TYLENOL) oral liquid 160 mg/5 mL  650 mg Oral Q6H  . albuterol  2 puff Inhalation Q6H  . bisacodyl  10 mg Oral Daily  . busPIRone  10 mg Oral TID  . Chlorhexidine Gluconate Cloth  6 each Topical Daily  . dextromethorphan-guaiFENesin  1 tablet Oral BID  . enoxaparin (LOVENOX) injection  40 mg Subcutaneous Q24H  . feeding supplement  237 mL Oral TID BM  . insulin aspart  0-5 Units Subcutaneous QHS  . insulin aspart  0-9 Units Subcutaneous TID WC  . lidocaine  1 patch Transdermal Q24H  . multivitamin with minerals  1 tablet Oral Daily  . nicotine  14 mg Transdermal Daily  . pantoprazole  40 mg Oral Daily  . senna-docusate  1 tablet Oral QHS  . traZODone  100 mg Oral QHS   Continuous Infusions: . sodium chloride    . sodium chloride 10 mL/hr at 04/07/20 1600  . cefTRIAXone (ROCEPHIN)  IV 2 g (04/09/20 0943)   PRN Meds:.Place/Maintain arterial line **AND** sodium chloride, albuterol, ALPRAZolam,  guaiFENesin-dextromethorphan, HYDROmorphone (DILAUDID) injection, hydrOXYzine, ketorolac, ondansetron (ZOFRAN) IV, oxyCODONE, sodium chloride flush, zolpidem       Objective:   Vitals:   04/09/20 0145 04/09/20 0540 04/09/20 0754 04/09/20 1206  BP: 104/73 99/71 107/79 100/79  Pulse: (!) 108 98 (!) 102 99  Resp: Temp: 98.8 F (37.1 C) 98 F (36.7 C) 98.4 F (36.9 C) 98.7 F (37.1 C)  TempSrc: Oral Oral Oral Oral  SpO2: 98% 99% 99% 99%  Weight:      Height:        Intake/Output Summary (Last 24 hours) at 04/09/2020 1222 Last data filed at 04/09/2020 0900 Gross per 24 hour  Intake 900 ml  Output 100 ml  Net 800 ml     Wt Readings from Last 3 Encounters:  04/02/20 50.2 kg  05/27/19 58.1 kg  05/26/19 58.1 kg     Exam  Awake Alert, Oriented X 3, No new F.N deficits, Normal affect Symmetrical Chest wall movement, diminished air entry in the left lung,with coarse resp sound, chest tube X2. RRR,No Gallops,Rubs or new Murmurs, No Parasternal Heave +ve B.Sounds, Abd Soft, No tenderness, No rebound - guarding or rigidity. No Cyanosis, Clubbing or edema, No new Rash or bruise      Data Reviewed:  I have personally reviewed following labs and imaging studies  Micro Results Recent Results (from the past 240 hour(s))  Resp Panel by RT-PCR (Flu  A&B, Covid) Nasopharyngeal Swab     Status: Abnormal   Collection Time: 04/01/20 10:42 PM   Specimen: Nasopharyngeal Swab; Nasopharyngeal(NP) swabs in vial transport medium  Result Value Ref Range Status   SARS Coronavirus 2 by RT PCR POSITIVE (A) NEGATIVE Final    Comment: RESULT CALLED TO, READ BACK BY AND VERIFIED WITH: T WALKER,RN@2337  04/01/20 MKELLY (NOTE) SARS-CoV-2 target nucleic acids are DETECTED.  The SARS-CoV-2 RNA is generally detectable in upper respiratory specimens during the acute phase of infection. Positive results are indicative of the presence of the identified virus, but do not rule out  bacterial infection or co-infection with other pathogens not detected by the test. Clinical correlation with patient history and other diagnostic information is necessary to determine patient infection status. The expected result is Negative.  Fact Sheet for Patients: BloggerCourse.com  Fact Sheet for Healthcare Providers: SeriousBroker.it  This test is not yet approved or cleared by the [place] FDA and  has been authorized for detection and/or diagnosis of SARS-CoV-2 by FDA under an Emergency Use Authorization (EUA).  This EUA will remain in effect (meaning this test can be  used) for the duration of  the COVID-19 declaration under Section 564(b)(1) of the Act, 21 U.S.C. section 360bbb-3(b)(1), unless the authorization is terminated or revoked sooner.     Influenza A by PCR NEGATIVE NEGATIVE Final   Influenza B by PCR NEGATIVE NEGATIVE Final    Comment: (NOTE) The Xpert Xpress SARS-CoV-2/FLU/RSV plus assay is intended as an aid in the diagnosis of influenza from Nasopharyngeal swab specimens and should not be used as a sole basis for treatment. Nasal washings and aspirates are unacceptable for Xpert Xpress SARS-CoV-2/FLU/RSV testing.  Fact Sheet for Patients: BloggerCourse.com  Fact Sheet for Healthcare Providers: SeriousBroker.it  This test is not yet approved or cleared by the [place] FDA and has been authorized for detection and/or diagnosis of SARS-CoV-2 by FDA under an Emergency Use Authorization (EUA). This EUA will remain in effect (meaning this test can be used) for the duration of the COVID-19 declaration under Section 564(b)(1) of the Act, 21 U.S.C. section 360bbb-3(b)(1), unless the authorization is terminated or revoked.  Performed at [facility], [address], [place], [place] [address]   Gram stain     Status: None   Collection Time: 04/02/20  11:40 AM   Specimen: Pleura; Body Fluid  Result Value Ref Range Status   Specimen Description PLEURAL  Final   Special Requests NONE  Final   Gram Stain   Final    GRAM POSITIVE COCCI Gram Stain Report Called to,Read Back By and Verified With: EASTER,T@1417  by MATTHEWS, B 12.17.21 WBC PRESENT,BOTH PMN AND MONONUCLEAR Performed at [facility], [address] [place], [place] [address]    Report Status 04/02/2020 FINAL  Final  Culture, body fluid-bottle     Status: Abnormal   Collection Time: 04/02/20 11:40 AM   Specimen: Pleura  Result Value Ref Range Status   Specimen Description   Final    PLEURAL Performed at [facility], [address] [place], [place] [address]    Special Requests   Final    NONE Performed at [facility], [address], [place], [place] [address]    Gram Stain   Final    GRAM POSITIVE COCCI AEROBIC BOTTLE Gram Stain Report Called to,Read Back By and Verified With: EASTER,T ON 04/02/20 AT 2040 BY LOY,C Performed at [facility] ANAEROBIC BOTTLE ALSO Performed at [facility], [address] [place], [place] [address]    Culture STREPTOCOCCUS PNEUMONIAE (A)  Final   Report Status 04/05/2020 FINAL  Final   Organism ID, Bacteria STREPTOCOCCUS PNEUMONIAE  Final      Susceptibility   Streptococcus pneumoniae - MIC*    ERYTHROMYCIN <=0.12 SENSITIVE Sensitive     LEVOFLOXACIN 1 SENSITIVE Sensitive     VANCOMYCIN 0.5 SENSITIVE Sensitive     PENICILLIN (meningitis) <=0.06 SENSITIVE Sensitive     PENO - penicillin <=0.06      PENICILLIN (non-meningitis) <=0.06 SENSITIVE Sensitive     PENICILLIN (oral) <=0.06 SENSITIVE Sensitive     CEFTRIAXONE (non-meningitis) <=0.12 SENSITIVE Sensitive     CEFTRIAXONE (meningitis) <=0.12 SENSITIVE Sensitive     * STREPTOCOCCUS PNEUMONIAE  Culture, blood (Routine X 2) w Reflex to ID Panel     Status: None   Collection Time: 04/02/20  2:48 PM   Specimen: BLOOD  Result Value Ref Range Status   Specimen Description  BLOOD LEFT ANTECUBITAL  Final   Special Requests   Final    BOTTLES DRAWN AEROBIC AND ANAEROBIC Blood Culture adequate volume   Culture   Final    NO GROWTH 5 DAYS Performed at [facility], [address] [place], [place] [address]    Report Status 04/08/2020 FINAL  Final  Culture, blood (Routine X 2) w Reflex to ID Panel     Status: None   Collection Time: 04/02/20  9:54 PM   Specimen: BLOOD  Result Value Ref Range Status   Specimen Description BLOOD RIGHT HAND  Final   Special Requests   Final    BOTTLES DRAWN AEROBIC ONLY Blood Culture adequate volume   Culture   Final    NO GROWTH 5 DAYS Performed at [facility], [address] [place], [place] [address]    Report Status 04/07/2020 FINAL  Final  Culture, blood (Routine X 2) w Reflex to ID Panel     Status: None   Collection Time: 04/02/20  9:54 PM   Specimen: BLOOD  Result Value Ref Range Status   Specimen Description BLOOD RIGHT HAND  Final   Special Requests   Final    BOTTLES DRAWN AEROBIC ONLY Blood Culture adequate volume   Culture   Final    NO GROWTH 5 DAYS Performed at [facility], [address] [place], [place] [address]    Report Status 04/07/2020 FINAL  Final  MRSA PCR Screening     Status: None   Collection Time: 04/03/20  7:11 AM   Specimen: Nasal Mucosa; Nasopharyngeal  Result Value Ref Range Status   MRSA by PCR NEGATIVE NEGATIVE Final    Comment:        The GeneXpert MRSA Assay (FDA approved for NASAL specimens only), is one component of a comprehensive MRSA colonization surveillance program. It is not intended to diagnose MRSA infection nor to guide or monitor treatment for MRSA infections. Performed at [facility], [address] [place], [place] [address]     Radiology Reports CT Angio Chest PE W and/or Wo Contrast  Result Date: 04/02/2020 CLINICAL DATA:  COVID exposure, dyspnea, cough, malaise, positive D-dimer EXAM: CT ANGIOGRAPHY CHEST WITH CONTRAST TECHNIQUE:  Multidetector CT imaging of the chest was performed using the standard protocol during bolus administration of intravenous contrast. Multiplanar CT image reconstructions and MIPs were obtained to evaluate the vascular anatomy. CONTRAST:  100mL OMNIPAQUE IOHEXOL 350 MG/ML SOLN COMPARISON:  None. FINDINGS: Cardiovascular: There is adequate opacification  of the pulmonary arterial tree. There is no intraluminal filling defect identified to suggest acute pulmonary embolism. The central pulmonary arteries are of normal caliber. There is marked mediastinal shift to the right. Global cardiac size within normal limits. No significant coronary artery calcification. No pericardial effusion. The thoracic aorta is unremarkable. Mediastinum/Nodes: Thyroid unremarkable. Soft tissue within the anterior mediastinum likely represents rebound thymic or residual thymic tissue. The esophagus is unremarkable. No pathologic thoracic adenopathy is identified. Lungs/Pleura: There is a massive left pleural effusion which completely fills the left hemithorax, completely collapses the left lung, and demonstrates marked mass effect upon the mediastinum with marked mediastinal shift to the right. There is interstitial gas within a portion of the left lower lobe likely representing the lateral segment of the left lower lobe which may represent necrosis of the setting of necrotizing pneumonia. Mild patchy ground-glass infiltrate within the a right upper lobe anteriorly is nonspecific, possibly infectious or inflammatory. A bilobed fluid density structure is seen within the right middle lobe measuring 2.3 x 3.6 x 1.6 cm in greatest dimension, nonspecific, but possibly the sequela of prior infection or trauma. Upper Abdomen: No acute abnormality. Musculoskeletal: No acute bone abnormality. Review of the MIP images confirms the above findings. IMPRESSION: No pulmonary embolism. Massive left pleural effusion, possibly representing a a parapneumonic  effusion or empyema, demonstrating marked mass effect upon the mediastinum with marked left right mediastinal shift. Extensive interstitial gas within the probable lateral segment of the right lower lobe suggesting parenchymal necrosis in the setting of necrotizing pneumonia. Bilobed fluid-filled structure within the right middle lobe measuring 3.6 cm possibly representing the sequela of remote trauma or inflammation. Minimal patchy infiltrate within the right upper lobe, likely infectious or inflammatory. Electronically Signed   By: Helyn Numbers MD   On: 04/02/2020 04:18   DG CHEST PORT 1 VIEW  Result Date: 04/08/2020 CLINICAL DATA:  Follow-up chest tube EXAM: PORTABLE CHEST 1 VIEW COMPARISON:  Radiograph 04/05/2020 FINDINGS: *Left subclavian approach central venous catheter tip terminates at the left brachiocephalic-caval confluence. *There are 2 left apically directed chest tubes which remain in place. *Telemetry leads and external support devices overlie the chest. There is a persistent complex left hydropneumothorax, not significantly changed in size from prior with persistent apical capping and lateral basal air lucency. Some more focal opacity along the pleural surfaces, could reflect a developing pleural rind with more coalescent opacity in the left lung base though only partially re-expanded. Redemonstrated bilobed nodular opacity present in the right lung base as well with additional streaky opacities favoring residual atelectasis or edema. Cardiomediastinal contours are unchanged. Extensive subcutaneous emphysema across the left chest wall and base of the left neck. IMPRESSION: 1. Stable complex left hydropneumothorax with 2 chest tubes in place. Overall size is unchanged from prior. 2. Some more focal opacity along the pleural surfaces, could reflect a developing pleural rind with more coalescent opacity in the left lung base though only partially re-expanded. 3. Extensive subcutaneous emphysema  across the left chest wall and base of the left neck. 4. Stable bilobed nodular opacity in the right middle lobe. Some additional atelectatic changes and/or edema bilaterally. Electronically Signed   By: Kreg Shropshire M.D.   On: 04/08/2020 22:04   DG CHEST PORT 1 VIEW  Result Date: 04/08/2020 CLINICAL DATA:  Pneumothorax. EXAM: PORTABLE CHEST 1 VIEW COMPARISON:  04/07/2020 and prior. FINDINGS: Lateral left basilar/sub pulmonic pneumothorax is unchanged. Apically terminating dual left chest tubes. Left subclavian CVC is unchanged with tip  overlying the azygos. Left lung volume loss and basilar opacities, similar prior exam. Clear right lung. IMPRESSION: Lateral left basilar/subpulmonic pneumothorax is unchanged. Indwelling apically terminating left chest tubes. Electronically Signed   By: Stana Bunting M.D.   On: 04/08/2020 09:06   DG Chest Port 1 View  Result Date: 04/07/2020 CLINICAL DATA:  Coronavirus infection.  Chest tube. EXAM: PORTABLE CHEST 1 VIEW COMPARISON:  04/06/2020 FINDINGS: Left subclavian central line tip the level of the azygos vein as seen previously. Two left chest tubes remain in place. Less pleural air than was seen yesterday. Small to moderate left pleural air collection does persist. Persistent infiltrate and volume loss in the left lung. Small amount of pleural fluid on the left. Few mild patchy infiltrates in the right lung appear similar. IMPRESSION: Less pleural air on the left than was seen yesterday. Small to moderate left pleural air collection does persist. Persistent infiltrate and volume loss in the left lung. Few mild patchy infiltrates in the right lung appear similar. Electronically Signed   By: Paulina Fusi M.D.   On: 04/07/2020 08:09   DG CHEST PORT 1 VIEW  Result Date: 04/06/2020 CLINICAL DATA:  Chest tube status post VATS.  COVID positive. EXAM: PORTABLE CHEST 1 VIEW COMPARISON:  04/05/2020.  CT 04/02/2020. FINDINGS: Left subclavian line and 2 left chest  tubes in stable position. Interim slight progression of left-sided pneumothorax. Stable left-sided pleural thickening. Persistent left lung atelectasis/infiltrate. Persistent density noted over the right lower chest. This density was noted to be within the right lung base on prior CT. This could represent a collection of pleural fluid and or atelectasis. Heart size stable. Left chest wall and supraclavicular subcutaneous emphysema again noted. IMPRESSION: 1. Stable positioning of left subclavian line and 2 left chest tubes. Interim slight progression of left-sided pneumothorax. Left chest wall and supraclavicular subcutaneous emphysema again noted without interim change. 2. Persistent left lung atelectasis/infiltrate. Stable left-sided pleural thickening. 3. Persistent density noted the right lower chest consistent with fluid pseudotumor and or atelectasis. Electronically Signed   By: Maisie Fus  Register   On: 04/06/2020 06:42   DG Chest Port 1 View  Result Date: 04/05/2020 CLINICAL DATA:  Chest tube in place. EXAM: PORTABLE CHEST 1 VIEW COMPARISON:  04/05/2020. FINDINGS: Left IJ line noted with tip over SVC. Interval removal of left pigtail chest tube. Interval placement of 2 large bore chest tubes. Interval near complete resolution of large left pleural effusion. Small pneumothorax noted. Atelectasis/infiltrate left lung. Density noted over the right lung may be overlying the chest. Heart size normal. Left chest wall subcutaneous emphysema. IMPRESSION: 1. Interim removal of left pigtail chest tube. Interval placement of 2 large bore chest tubes with near complete resolution of large left pleural effusion. Small left pneumothorax noted. 2. Associated atelectasis/infiltrate left lung. These results will be called to the ordering clinician or representative by the Radiologist Assistant, and communication documented in the PACS or [employer]. Electronically Signed   By: Maisie Fus  Register   On: 04/05/2020 16:26    DG Chest Port 1 View  Result Date: 04/05/2020 CLINICAL DATA:  Shortness of breath, COVID-19 positivity EXAM: PORTABLE CHEST 1 VIEW COMPARISON:  04/04/2020 FINDINGS: Cardiac shadow is obscured by the large left empyema. Right lung remains clear. Left hemithorax demonstrates opacification relatively similar to that seen on the prior exam. Pigtail catheter is noted in place. Slight improved aeration in the left upper lobe is noted. No bony abnormality is seen. IMPRESSION: Slight improved aeration on the left.  Pigtail catheter remains in place with relatively CT stable left empyema. Electronically Signed   By: Alcide Clever M.D.   On: 04/05/2020 09:04   DG Chest Port 1 View  Result Date: 04/04/2020 CLINICAL DATA:  Shortness of breath.  History of COVID. EXAM: PORTABLE CHEST 1 VIEW COMPARISON:  Chest radiograph 04/02/2020. FINDINGS: Monitoring leads overlie the patient. Cardiac and mediastinal contours largely obscured. Interval insertion left chest tube. Interval decrease in size of left pleural fluid within the upper hemithorax and mid hemithorax. Similar-appearing nodular opacity right mid lung. IMPRESSION: 1. Interval insertion left chest tube with interval decrease in size of left pleural fluid. 2. Similar-appearing nodular opacity right mid lung. Electronically Signed   By: Annia Belt M.D.   On: 04/04/2020 10:46   DG Chest Portable 1 View  Result Date: 04/02/2020 CLINICAL DATA:  Large LEFT pleural effusion EXAM: PORTABLE CHEST 1 VIEW COMPARISON:  Portable exam at 1203 compared to 0227 hrs FINDINGS: Resolution of previously identified LEFT to RIGHT mediastinal shift. Unable to assess heart size due to subtotal opacification of the LEFT hemithorax. Minimal aeration now seen in LEFT lung. No pneumothorax. Nodular foci at inferior RIGHT lung again seen with minimal RIGHT basilar atelectasis. Osseous structures unremarkable. IMPRESSION: No pneumothorax following LEFT thoracentesis. Resolution of LEFT to  RIGHT mediastinal shift. Persistent nodular foci at inferior RIGHT hemithorax. Electronically Signed   By: Ulyses Southward M.D.   On: 04/02/2020 12:32   DG Chest Port 1 View  Result Date: 04/02/2020 CLINICAL DATA:  COVID, shortness of breath EXAM: PORTABLE CHEST 1 VIEW COMPARISON:  None. FINDINGS: Complete whiteout of the left hemithorax, likely related to a combination of effusion and airspace disease. Heart and mediastinal structures are shifted to the right. Right mid lung patchy airspace disease. No effusion on the right. No acute bony abnormality. IMPRESSION: IMPRESSION Complete opacification of the left hemithorax, likely related to effusion and airspace disease. Patchy right mid lung airspace disease. Electronically Signed   By: Charlett Nose M.D.   On: 04/02/2020 02:48   CT [facility] PLEURAL DRAIN W/INDWELL CATH W/IMG GUIDE  Result Date: 04/03/2020 CLINICAL DATA:  Large left pleural empyema, COVID-19 infection and history of IV drug use. Request for percutaneous thoracostomy tube placement to drain the empyema. EXAM: CT GUIDED CATHETER DRAINAGE OF LEFT PLEURAL EMPYEMA ANESTHESIA/SEDATION: 2.0 mg IV Versed 100 mcg IV Fentanyl Total Moderate Sedation Time:  10 minutes The patient's level of consciousness and physiologic status were continuously monitored during the procedure by Radiology nursing. PROCEDURE: The procedure, risks, benefits, and alternatives were explained to the patient. Questions regarding the procedure were encouraged and answered. The patient understands and consents to the procedure. A time out was performed prior to initiating the procedure. CT was performed through the chest in a supine position. The left chest wall was prepped with chlorhexidine in a sterile fashion, and a sterile drape was applied covering the operative field. A sterile gown and sterile gloves were used for the procedure. Local anesthesia was provided with 1% Lidocaine. Under CT guidance, an 18 gauge trocar needle was  advanced into the left pleural space from an anterolateral approach. After confirming needle tip position, fluid was aspirated. A guidewire was advanced and the needle removed. The percutaneous tract was dilated and a 14 French pigtail drainage catheter advanced into the left pleural space. Catheter position was confirmed by CT. The pigtail catheter was connected to a Pleur-evac device. The catheter was secured at the skin with a Prolene retention suture, StatLock  device and dressed with a Vaseline gauze dressing. COMPLICATIONS: None FINDINGS: A large amount of residual left pleural fluid remains despite large volume thoracentesis yesterday. Aspiration from the pleural space yielded grossly purulent fluid. Repeat laboratory analysis was not performed as the fluid removed at the time of thoracentesis yesterday was sent for laboratory testing. After placement of the 14 French drainage catheter, there is good return of purulent fluid. The Pleur-evac will be connected to wall suction at -20 cm of water when the patient returns to her hospital room. IMPRESSION: CT-guided placement of 14 French left chest tube. A 14 French drainage catheter was placed with return of purulent fluid. The drainage catheter was attached to a Pleur-evac device which will be attached to wall suction. Electronically Signed   By: Irish Lack M.D.   On: 04/03/2020 13:33   ECHOCARDIOGRAM LIMITED  Result Date: 04/03/2020    ECHOCARDIOGRAM LIMITED REPORT   Patient Name:   ABIGAL Louk Date of Exam: 04/03/2020 Medical Rec #:  [id number]     Height:       67.0 in Accession #:    [id number]    Weight:       110.7 lb Date of Birth:  09-16-1985     BSA:          1.573 m Patient Age:    34 years      BP:           109/79 mmHg Patient Gender: F             HR:           99 bpm. Exam Location:  Inpatient Procedure: Limited Echo, Limited Color Doppler and Cardiac Doppler Indications:    endocarditis  History:        Patient has no prior history of  Echocardiogram examinations.                 Covid. Pleural effusion.; Risk Factors:Current Smoker and IV                 drug use.  Sonographer:    Delcie Roch Referring Phys: [id number] COURAGE EMOKPAE  Sonographer Comments: Image acquisition challenging due to respiratory motion. IMPRESSIONS  1. Left ventricular ejection fraction, by estimation, is 60 to 65%. The left ventricle has normal function.  2. Right ventricular systolic function is normal. The right ventricular size is normal.  3. A small pericardial effusion is present. The pericardial effusion is circumferential.  4. The mitral valve is normal in structure. No evidence of mitral valve regurgitation. No evidence of mitral stenosis.  5. The aortic valve was not well visualized. Aortic valve regurgitation is not visualized. No aortic stenosis is present.  6. The inferior vena cava is normal in size with greater than 50% respiratory variability, suggesting right atrial pressure of 3 mmHg. FINDINGS  Left Ventricle: Left ventricular ejection fraction, by estimation, is 60 to 65%. The left ventricle has normal function. Right Ventricle: The right ventricular size is normal. Right vetricular wall thickness was not well visualized. Right ventricular systolic function is normal. Pericardium: A small pericardial effusion is present. The pericardial effusion is circumferential. Mitral Valve: The mitral valve is normal in structure. No evidence of mitral valve stenosis. Tricuspid Valve: The tricuspid valve is normal in structure. Tricuspid valve regurgitation is not demonstrated. No evidence of tricuspid stenosis. Aortic Valve: The aortic valve was not well visualized. Aortic valve regurgitation is not visualized. No aortic stenosis is present. Pulmonic Valve: The pulmonic  valve was normal in structure. Pulmonic valve regurgitation is not visualized. No evidence of pulmonic stenosis. Venous: The inferior vena cava is normal in size with greater than 50%  respiratory variability, suggesting right atrial pressure of 3 mmHg. LEFT VENTRICLE PLAX 2D LVIDd:         3.90 cm LVIDs:         2.30 cm LV IVS:        0.70 cm LVOT diam:     1.60 cm LVOT Area:     2.01 cm  IVC IVC diam: 1.90 cm LEFT ATRIUM         Index LA diam:    2.10 cm 1.34 cm/m   AORTA Ao Root diam: 2.20 cm MITRAL VALVE MV Area (PHT): 5.23 cm    SHUNTS MV Decel Time: 145 msec    Systemic Diam: 1.60 cm MV E velocity: 88.70 cm/s MV A velocity: 61.70 cm/s MV E/A ratio:  1.44 Dina Rich MD Electronically signed by Dina Rich MD Signature Date/Time: 04/03/2020/2:31:28 PM    Final    US THORACENTESIS ASP PLEURAL SPACE W/IMG GUIDE  Result Date: 04/02/2020 INDICATION: Large LEFT pleural effusion EXAM: ULTRASOUND GUIDED DIAGNSOTIC AND THERAPEUTIC LEFT THORACENTESIS MEDICATIONS: None. COMPLICATIONS: None immediate. PROCEDURE: Procedure, benefits, and risks of procedure were discussed with patient. Written informed consent for procedure was obtained. Time out protocol followed. Pleural effusion localized by ultrasound at the posterior LEFT hemithorax. Fluid is markedly complex containing diffuse internal echogenicity as well as dependent echogenicity. Skin prepped and draped in usual sterile fashion. Skin and soft tissues anesthetized with 10 mL of 1% lidocaine. 8 French thoracentesis catheter placed into the LEFT pleural space. 1.88 L of complex cloudy tan fluid with particulates with purulent appearance aspirated by syringe pump. Findings most likely represent empyema. Procedure tolerated well by patient without immediate complication. FINDINGS: Complex purulent appearing LEFT pleural fluid likely representing empyema. IMPRESSION: Successful ultrasound guided LEFT thoracentesis yielding 1.88 L of pleural fluid. Electronically Signed   By: Ulyses Southward M.D.   On: 04/02/2020 12:39    Lab Data:  CBC: Recent Labs  Lab 04/03/20 0008 04/04/20 0415 04/05/20 0342 04/06/20 0518 04/07/20 0549  04/08/20 0051 04/09/20 0214  WBC 8.5 6.0 7.3 14.1* 12.1* 12.4* 9.8  NEUTROABS 4.7 3.7 5.2  --   --   --   --   HGB 11.2* 12.3 10.6* 10.7* 10.6* 9.4* 9.8*  HCT 33.4* 38.7 32.9* 32.3* 31.4* 27.7* 29.7*  MCV 82.5 85.4 86.4 85.2 84.6 82.9 84.6  PLT 584* 485* 584* 585* 564* 505* 480*   Basic Metabolic Panel: Recent Labs  Lab 04/03/20 0008 04/04/20 0415 04/05/20 0342 04/06/20 0518 04/07/20 0549 04/08/20 0051 04/09/20 0214  NA 133* 136 139 136 137 136 137  K 3.6 4.0 4.4 4.0 3.7 3.2* 4.3  CL 96* 101 106 103 104 108 105  CO2 26 25 25 26 25 22 25   GLUCOSE 107* 121* 114* 71 102* 87 116*  BUN 23* 17 13 13 13 9 11   CREATININE 0.69 0.73 0.76 0.60 0.60 0.49 0.56  CALCIUM 8.0* 7.9* 7.5* 7.7* 7.9* 6.7* 8.0*  MG 1.9 1.8 1.7  --  1.7  --   --   PHOS 4.5 3.1 3.1  --   --   --   --    GFR: Estimated Creatinine Clearance: 80 mL/min (by C-G formula based on SCr of 0.56 mg/dL). Liver Function Tests: Recent Labs  Lab 04/04/20 0415 04/05/20 0342 04/07/20 0549 04/08/20 0051 04/09/20  0214  AST 27 19 18  12* 14*  ALT 30 21 16 14 14   ALKPHOS 47 40 46 41 49  BILITOT 0.5 0.3 0.4 0.3 0.5  PROT 5.8* 5.2* 4.9* 4.2* 4.8*  ALBUMIN 1.7* 1.6* 1.6* 1.4* 1.6*   No results for input(s): LIPASE, AMYLASE in the last 168 hours. No results for input(s): AMMONIA in the last 168 hours. Coagulation Profile: Recent Labs  Lab 04/03/20 0008  INR 1.1   Cardiac Enzymes: No results for input(s): CKTOTAL, CKMB, CKMBINDEX, TROPONINI in the last 168 hours. BNP (last 3 results) No results for input(s): PROBNP in the last 8760 hours. HbA1C: No results for input(s): HGBA1C in the last 72 hours. CBG: Recent Labs  Lab 04/08/20 1214 04/08/20 1637 04/08/20 2105 04/09/20 0752 04/09/20 1205  GLUCAP 108* 96 109* 95 100*   Lipid Profile: No results for input(s): CHOL, HDL, LDLCALC, TRIG, CHOLHDL, LDLDIRECT in the last 72 hours. Thyroid Function Tests: No results for input(s): TSH, T4TOTAL, FREET4, T3FREE,  THYROIDAB in the last 72 hours. Anemia Panel: No results for input(s): VITAMINB12, FOLATE, FERRITIN, TIBC, IRON, RETICCTPCT in the last 72 hours. Urine analysis: No results found for: COLORURINE, APPEARANCEUR, LABSPEC, PHURINE, GLUCOSEU, HGBUR, BILIRUBINUR, KETONESUR, PROTEINUR, UROBILINOGEN, NITRITE, Arby Barrette Devlin Mcveigh M.D. [facility] 04/09/2020, 12:22 PM   Call night coverage person covering after 7pm

## 2020-04-09 NOTE — Progress Notes (Signed)
[facility] DAILY ICU PROGRESS NOTE                   [address]            [place] [address]          [phone]   4 Days Post-Op Procedure(s) (LRB): VIDEO ASSISTED THORACOSCOPY (VATS)/EMPYEMA DRAINAGE, DECORTICATION  (Left)  Total Length of Stay:  LOS: 7 days   Subjective: Comfortable in bed awake and alert, no distress   Objective: Vital signs in last 24 hours: Temp:  [97.7 F (36.5 C)-99.2 F (37.3 C)] 98.4 F (36.9 C) (12/24 0754) Pulse Rate:  [98-119] 102 (12/24 0754) Cardiac Rhythm: Normal sinus rhythm (12/24 0700) Resp:  [15-26] 15 (12/24 0754) BP: (99-108)/(61-79) 107/79 (12/24 0754) SpO2:  [94 %-100 %] 99 % (12/24 0754)  Filed Weights   04/01/20 2125 04/02/20 2220  Weight: 52.2 kg 50.2 kg    Weight change:    Hemodynamic parameters for last 24 hours:    Intake/Output from previous day: 12/23 0701 - 12/24 0700 In: 760 [P.O.:760] Out: 100 [Chest Tube:100]  Intake/Output this shift: Total I/O In: 360 [P.O.:360] Out: -   Current Meds: Scheduled Meds: . acetaminophen  650 mg Oral Q6H   Or  . acetaminophen (TYLENOL) oral liquid 160 mg/5 mL  650 mg Oral Q6H  . albuterol  2 puff Inhalation Q6H  . bisacodyl  10 mg Oral Daily  . busPIRone  10 mg Oral TID  . Chlorhexidine Gluconate Cloth  6 each Topical Daily  . dextromethorphan-guaiFENesin  1 tablet Oral BID  . enoxaparin (LOVENOX) injection  40 mg Subcutaneous Q24H  . feeding supplement  237 mL Oral TID BM  . insulin aspart  0-5 Units Subcutaneous QHS  . insulin aspart  0-9 Units Subcutaneous TID WC  . lidocaine  1 patch Transdermal Q24H  . multivitamin with minerals  1 tablet Oral Daily  . nicotine  14 mg Transdermal Daily  . pantoprazole  40 mg Oral Daily  . senna-docusate  1 tablet Oral QHS  . traZODone  100 mg Oral QHS   Continuous Infusions: . sodium chloride    . sodium chloride 10 mL/hr at 04/07/20 1600  . cefTRIAXone (ROCEPHIN)  IV 2 g (04/09/20 0943)   PRN  Meds:.Place/Maintain arterial line **AND** sodium chloride, albuterol, ALPRAZolam, guaiFENesin-dextromethorphan, HYDROmorphone (DILAUDID) injection, hydrOXYzine, ketorolac, ondansetron (ZOFRAN) IV, oxyCODONE, sodium chloride flush, zolpidem  General appearance: alert and cooperative Neurologic: intact Heart: regular rate and rhythm, S1, S2 normal, no murmur, click, rub or gallop Lungs: diminished breath sounds LLL Wound: air leak from chest tube   Lab Results: CBC: Recent Labs    04/08/20 0051 04/09/20 0214  WBC 12.4* 9.8  HGB 9.4* 9.8*  HCT 27.7* 29.7*  PLT 505* 480*   BMET:  Recent Labs    04/08/20 0051 04/09/20 0214  NA 136 137  K 3.2* 4.3  CL 108 105  CO2 22 25  GLUCOSE 87 116*  BUN 9 11  CREATININE 0.49 0.56  CALCIUM 6.7* 8.0*    CMET: Lab Results  Component Value Date   WBC 9.8 04/09/2020   HGB 9.8 (L) 04/09/2020   HCT 29.7 (L) 04/09/2020   PLT 480 (H) 04/09/2020   GLUCOSE 116 (H) 04/09/2020   ALT 14 04/09/2020   AST 14 (L) 04/09/2020   NA 137 04/09/2020   K 4.3 04/09/2020   CL 105 04/09/2020   CREATININE 0.56 04/09/2020   BUN 11  04/09/2020   CO2 25 04/09/2020   INR 1.1 04/03/2020      PT/INR: No results for input(s): LABPROT, INR in the last 72 hours. Radiology: DG CHEST PORT 1 VIEW  Result Date: 04/08/2020 CLINICAL DATA:  Follow-up chest tube EXAM: PORTABLE CHEST 1 VIEW COMPARISON:  Radiograph 04/05/2020 FINDINGS: *Left subclavian approach central venous catheter tip terminates at the left brachiocephalic-caval confluence. *There are 2 left apically directed chest tubes which remain in place. *Telemetry leads and external support devices overlie the chest. There is a persistent complex left hydropneumothorax, not significantly changed in size from prior with persistent apical capping and lateral basal air lucency. Some more focal opacity along the pleural surfaces, could reflect a developing pleural rind with more coalescent opacity in the left lung  base though only partially re-expanded. Redemonstrated bilobed nodular opacity present in the right lung base as well with additional streaky opacities favoring residual atelectasis or edema. Cardiomediastinal contours are unchanged. Extensive subcutaneous emphysema across the left chest wall and base of the left neck. IMPRESSION: 1. Stable complex left hydropneumothorax with 2 chest tubes in place. Overall size is unchanged from prior. 2. Some more focal opacity along the pleural surfaces, could reflect a developing pleural rind with more coalescent opacity in the left lung base though only partially re-expanded. 3. Extensive subcutaneous emphysema across the left chest wall and base of the left neck. 4. Stable bilobed nodular opacity in the right middle lobe. Some additional atelectatic changes and/or edema bilaterally. Electronically Signed   By: Kreg Shropshire M.D.   On: 04/08/2020 22:04     Assessment/Plan: S/P Procedure(s) (LRB): VIDEO ASSISTED THORACOSCOPY (VATS)/EMPYEMA DRAINAGE, DECORTICATION  (Left) Chest tubes in good position- xray today not done yet With drainage and air lek chest tubes need to stay  Left central line placed at time of surgery can be removed at any time but right pic not working - leave line management to medical team I have seen and examined Bennie Pierini and agree with the above assessment  and plan.  Delight Ovens MD Beeper [phone] Office [phone] 04/09/2020 11:34 AM       Delight Ovens 04/09/2020 11:31 AM

## 2020-04-10 ENCOUNTER — Inpatient Hospital Stay (HOSPITAL_COMMUNITY): Payer: Medicaid Other

## 2020-04-10 LAB — COMPREHENSIVE METABOLIC PANEL
ALT: 16 U/L (ref 0–44)
AST: 14 U/L — ABNORMAL LOW (ref 15–41)
Albumin: 2.1 g/dL — ABNORMAL LOW (ref 3.5–5.0)
Alkaline Phosphatase: 55 U/L (ref 38–126)
Anion gap: 11 (ref 5–15)
BUN: 7 mg/dL (ref 6–20)
CO2: 23 mmol/L (ref 22–32)
Calcium: 8.6 mg/dL — ABNORMAL LOW (ref 8.9–10.3)
Chloride: 102 mmol/L (ref 98–111)
Creatinine, Ser: 0.52 mg/dL (ref 0.44–1.00)
GFR, Estimated: >60 mL/min (ref >60)
Glucose, Bld: 72 mg/dL (ref 70–99)
Potassium: 4.8 mmol/L (ref 3.5–5.1)
Sodium: 136 mmol/L (ref 135–145)
Total Bilirubin: 0.4 mg/dL (ref 0.3–1.2)
Total Protein: 6.5 g/dL (ref 6.5–8.1)

## 2020-04-10 LAB — CBC
HCT: 34.3 % — ABNORMAL LOW (ref 36.0–46.0)
Hemoglobin: 11.1 g/dL — ABNORMAL LOW (ref 12.0–15.0)
MCH: 27.5 pg (ref 26.0–34.0)
MCHC: 32.4 g/dL (ref 30.0–36.0)
MCV: 84.9 fL (ref 80.0–100.0)
Platelets: 621 10*3/uL — ABNORMAL HIGH (ref 150–400)
RBC: 4.04 MIL/uL (ref 3.87–5.11)
RDW: 15 % (ref 11.5–15.5)
WBC: 11.9 10*3/uL — ABNORMAL HIGH (ref 4.0–10.5)
nRBC: 0 % (ref 0.0–0.2)

## 2020-04-10 LAB — GLUCOSE, CAPILLARY
Glucose-Capillary: 108 mg/dL — ABNORMAL HIGH (ref 70–99)
Glucose-Capillary: 102 mg/dL — ABNORMAL HIGH (ref 70–99)
Glucose-Capillary: 107 mg/dL — ABNORMAL HIGH (ref 70–99)
Glucose-Capillary: 95 mg/dL (ref 70–99)

## 2020-04-10 MED ORDER — HYDROMORPHONE HCL 1 MG/ML IJ SOLN
0.5000 mg | INTRAMUSCULAR | Status: DC | PRN
  Administered 2020-04-10 – 2020-04-16 (×34): 0.5 mg via INTRAVENOUS
  Filled 2020-04-10 (×6): qty 0.5
  Filled 2020-04-10: qty 1
  Filled 2020-04-10 (×5): qty 0.5
  Filled 2020-04-10: qty 1
  Filled 2020-04-10 (×8): qty 0.5
  Filled 2020-04-10: qty 1
  Filled 2020-04-10: qty 0.5
  Filled 2020-04-10: qty 1
  Filled 2020-04-10: qty 0.5
  Filled 2020-04-10: qty 1
  Filled 2020-04-10 (×8): qty 0.5

## 2020-04-10 NOTE — Progress Notes (Signed)
   04/10/20 0001  Assess: MEWS Score  Temp 99 F (37.2 C)  BP 109/83  Pulse Rate (!) 110  ECG Heart Rate (!) 110  Resp (!) 24  Level of Consciousness Alert  SpO2 100 %  O2 Device Room Air  Patient Activity (if Appropriate) In bed  Assess: MEWS Score  MEWS Temp 0  MEWS Systolic 0  MEWS Pulse 1  MEWS RR 1  MEWS LOC 0  MEWS Score 2  MEWS Score Color Yellow  Assess: if the MEWS score is Yellow or Red  Were vital signs taken at a resting state? Yes  Focused Assessment No change from prior assessment  Early Detection of Sepsis Score *See Row Information* Low  MEWS guidelines implemented *See Row Information* Yes  Treat  MEWS Interventions Administered prn meds/treatments (PRN pain meds)  Take Vital Signs  Increase Vital Sign Frequency  Yellow: Q 2hr X 2 then Q 4hr X 2, if remains yellow, continue Q 4hrs  Escalate  MEWS: Escalate Yellow: discuss with charge nurse/RN and consider discussing with provider and RRT  Notify: Charge Nurse/RN  Name of Charge Nurse/RN Notified Edwina Barth RN  Date Charge Nurse/RN Notified 04/10/20  Time Charge Nurse/RN Notified 0008  Document  Patient Outcome Stabilized after interventions  Progress note created (see row info) Yes  Patient's HR continues to be tachy, which fired a yellow MEWS. Yellow MEWS protocol initiated.

## 2020-04-10 NOTE — Progress Notes (Signed)
   04/10/20 2056  Assess: MEWS Score  Temp 98.9 F (37.2 C)  BP 101/77  Pulse Rate (!) 107  ECG Heart Rate (!) 109  Resp (!) 21  SpO2 97 %  O2 Device Room Air  Patient Activity (if Appropriate) In bed  O2 Flow Rate (L/min) 0 L/min  Assess: MEWS Score  MEWS Temp 0  MEWS Systolic 0  MEWS Pulse 1  MEWS RR 1  MEWS LOC 0  MEWS Score 2  MEWS Score Color Yellow  Assess: if the MEWS score is Yellow or Red  Were vital signs taken at a resting state? Yes  Focused Assessment No change from prior assessment  Early Detection of Sepsis Score *See Row Information* Low  MEWS guidelines implemented *See Row Information* Yes  Take Vital Signs  Increase Vital Sign Frequency  Yellow: Q 2hr X 2 then Q 4hr X 2, if remains yellow, continue Q 4hrs  Escalate  MEWS: Escalate Yellow: discuss with charge nurse/RN and consider discussing with provider and RRT  Notify: Charge Nurse/RN  Name of Charge Nurse/RN Notified Tobi Bastos  Date Charge Nurse/RN Notified 04/10/20  Time Charge Nurse/RN Notified 1959

## 2020-04-10 NOTE — Progress Notes (Addendum)
      [address]       [place] [address]             [phone]      5 Days Post-Op Procedure(s) (LRB): VIDEO ASSISTED THORACOSCOPY (VATS)/EMPYEMA DRAINAGE, DECORTICATION  (Left) Subjective:  Resting in bed, says she is having pain at the chest tube insertion sites but it is controlled with the analgesics (Dilaudid, oxycodone).   Objective: Vital signs in last 24 hours: Temp:  [97.8 F (36.6 C)-99 F (37.2 C)] 97.8 F (36.6 C) (12/25 0801) Pulse Rate:  [94-111] 94 (12/25 0801) Cardiac Rhythm: Sinus tachycardia (12/25 0700) Resp:  [16-24] 18 (12/25 0801) BP: (91-112)/(56-83) 95/69 (12/25 0801) SpO2:  [98 %-100 %] 99 % (12/25 0801)  Hemodynamic parameters for last 24 hours:    Intake/Output from previous day: 12/24 0701 - 12/25 0700 In: 900 [P.O.:800; IV Piggyback:100] Out: 125 [Chest Tube:125] Intake/Output this shift: Total I/O In: 280 [P.O.:280] Out: -   General appearance: alert, cooperative and mild distress Neurologic: intact Lungs: Clear breath sounds on the right, diminished on the left.  Two larg-bore chest tubes are secure. Small air leak persists. CT's drained past 24 hours.  Wound: CT insertion sites are covered with dry dressings.   Lab Results: Recent Labs    04/08/20 0051 04/09/20 0214  WBC 12.4* 9.8  HGB 9.4* 9.8*  HCT 27.7* 29.7*  PLT 505* 480*   BMET:  Recent Labs    04/08/20 0051 04/09/20 0214  NA 136 137  K 3.2* 4.3  CL 108 105  CO2 22 25  GLUCOSE 87 116*  BUN 9 11  CREATININE 0.49 0.56  CALCIUM 6.7* 8.0*    PT/INR: No results for input(s): LABPROT, INR in the last 72 hours. ABG No results found for: PHART, HCO3, TCO2, ACIDBASEDEF, O2SAT CBG (last 3)  Recent Labs    04/09/20 1647 04/09/20 2050 04/10/20 0804  GLUCAP 78 87 108*    Assessment/Plan: S/P Procedure(s) (LRB): VIDEO ASSISTED THORACOSCOPY (VATS)/EMPYEMA DRAINAGE, DECORTICATION  (Left)  -POD-5 VATS, drainage of empyema, decortication  in 34yo IVDU who was oringinally admitted [facility] 04/02/20 with COVID infection.  On Ceftriaxone for strep pneumoniae cultured from pleural fluid at prior to transfer here. Continues to have significant drainage but it is not grossly purulent. Also has an active air leak. The CXR is stable.  No change in mgt. From CTS standpoint.     LOS: 8 days    Parke Poisson [phone] 04/10/2020   Chart reviewed, patient examined, agree with above. Still has an air leak. CXR shows a persistent ptx or space despite tubes in good position. Continue chest tubes to suction.

## 2020-04-10 NOTE — Progress Notes (Signed)
[facility]                                                                              Patient Demographics  Alyssa Vargas, is a 34 y.o. female, DOB - Jul 24, 1985, OZD:[id number]  Admit date - 04/01/2020   Admitting Physician Courage Mariea Clonts, MD  Outpatient Primary MD for the patient is Patient, No Pcp Per  Outpatient specialists:   LOS - 8  days   Medical records reviewed and are as summarized below:    Chief Complaint  Patient presents with  . Covid Exposure       Brief summary   34 yo WF with H/o IVDU (iv Heroin and iv Methamphetamine) and Tobacco abuse admitted on 04/02/2020 to [facility] with dyspnea, productive cough, loss of sense of taste and smell, anorexia, generalized aches, pains, malaise fatigue and myalgias and found to be positive for COVID-19 infection with chest x-ray consistent with very very large left-sided pleural effusion with mediastinal shift,-left-sided thoracentesis on 04/02/2020 you did purulent fluid consistent with empyema with Peptococcus pneumonia, she had CT guided chest tube insertion by IR as well, she was seen by CT surgery for which she had VATS with empyema drainage and decortication on 04/05/2020.  Subjective:   Alyssa Vargas was seen and examined today, still complaining of pain at the insertion site of chest tubes.     Assessment & Plan   Massive left-sided empyema lung with mediastinal shift -Large left-sided empyema, CT surgery, Dr. Tyrone Sage was consulted by Dr Mariea Clonts, recommended IR to place large bore chest tube to drain the purulent/empyema fluid. -Presently on IV Zosyn and vancomycin, this has been noted to Rocephin once culture were available and showing Streptococcus pneumonia -Status post left-sided thoracentesis on 12/17, 1.88 L purulent appearing fluid removed, CT surgery input greatly appreciated, upon further evaluation decision has been made to proceed with a VATS surgery, . -Status post 14 French drain  placement in the left pleural empyema by IR on 12/18. - VATS with empyema drainage and decortication on 04/05/2020. -Chest tube management per CT surgery, she remains with airleak, and drainage, so chest tube is to stay, she was encouraged to use incentive spirometer, and to get out of bed to chair .  There is no significant change in her x-ray.  Acute hypoxic respiratory failure  -Multifactorial due to acute COVID-19 viral pneumonia and due to large left-sided empyema -recieved  IV steroids for COVID-19 of pneumonia, has rapidly tapered R hypoxia thought to be mainly due to empyema. -Resolved, on room air -Treated with Remdesivir.  COVID-19 infection -treated  with IV steroids and Remdesivir  Lab Results  Component Value Date   SARSCOV2NAA POSITIVE (A) 04/01/2020     Recent Labs  Lab 04/04/20 0415 04/05/20 0342 04/07/20 0549 04/08/20 0051 04/09/20 0214 04/10/20 0945  DDIMER 1.41* 1.85*  --   --   --   --   FERRITIN 264 191  --   --   --   --   CRP 1.9* 1.0*  --   --   --   --   ALT 30 21 16 14 14  16  History of IV drug use (IV heroin, methamphetamine) -Patient reported she last used IV methamphetamine and heroin on Sunday, 03/28/20  Depression, anorexia -Denies suicidal or homicidal ideation or plan -Continue BuSpar   Hyponatremia -Resolved with hydration.  Tobacco use -Nicotine patch placed  Protein calorie malnutrition -Continue with supplements  will discontinue her central line,   Code Status: Full CODE STATUS DVT Prophylaxis: Subcu Lovenox Family Communication: Discussed all imaging results, lab results, explained to the patient  Disposition Plan:     Status is: Inpatient  Remains inpatient appropriate because:Inpatient level of care appropriate due to severity of illness   Dispo: The patient is from: Home              Anticipated d/c is to: Home              Anticipated d/c date is: > 3 days              Patient currently is not medically stable  to d/c.        Procedures:  -Status post left-sided thoracentesis on 12/17, 1.88 L purulent appearing fluid removed, Gram stain with GPC, sensitivities still pending -Status post 14 French drain placement in the left pleural empyema by IR on 12/18.  Consultants:   PCCM CT surgery Intervention radiology  Antimicrobials:   Anti-infectives (From admission, onward)   Start     Dose/Rate Route Frequency Ordered Stop   04/06/20 1045  cefTRIAXone (ROCEPHIN) 2 g in sodium chloride 0.9 % 100 mL IVPB        2 g 200 mL/hr over 30 Minutes Intravenous Every 24 hours 04/06/20 0945     04/05/20 1100  cefTRIAXone (ROCEPHIN) 2 g in sodium chloride 0.9 % 100 mL IVPB  Status:  Discontinued        2 g 200 mL/hr over 30 Minutes Intravenous Every 24 hours 04/05/20 1023 04/05/20 1453   04/03/20 1600  vancomycin (VANCOREADY) IVPB 750 mg/150 mL  Status:  Discontinued        750 mg 150 mL/hr over 60 Minutes Intravenous Every 12 hours 04/03/20 1500 04/05/20 1020   04/03/20 1000  remdesivir 100 mg in sodium chloride 0.9 % 100 mL IVPB  Status:  Discontinued       "Followed by" Linked Group Details   100 mg 200 mL/hr over 30 Minutes Intravenous Daily 04/02/20 0654 04/02/20 0656   04/03/20 1000  remdesivir 100 mg in sodium chloride 0.9 % 100 mL IVPB       "Followed by" Linked Group Details   100 mg 200 mL/hr over 30 Minutes Intravenous Daily 04/02/20 0658 04/06/20 0948   04/03/20 0400  vancomycin (VANCOREADY) IVPB 500 mg/100 mL  Status:  Discontinued        50 0 mg 100 mL/hr over 60 Minutes Intravenous Every 12 hours 04/02/20 1459 04/03/20 1500   04/02/20 2200  piperacillin-tazobactam (ZOSYN) IVPB 3.375 g  Status:  Discontinued        3.375 g 12.5 mL/hr over 240 Minutes Intravenous Every 8 hours 04/02/20 1332 04/05/20 1023   04/02/20 1515  vancomycin (VANCOREADY) IVPB 1250 mg/250 mL  Status:  Discontinued        1,250 mg 166.7 mL/hr over 90 Minutes Intravenous Every 24 hours 04/02/20 1449 04/02/20 1449    04/02/20 1515  vancomycin (VANCOREADY) IVPB 1250 mg/250 mL        1,250 mg 166.7 mL/hr over 90 Minutes Intravenous  Once 04/02/20 1449 04/02/20 1701   04/02/20 1345  piperacillin-tazobactam (  ZOSYN) IVPB 3.375 g        3.375 g 100 mL/hr over 30 Minutes Intravenous  Once 04/02/20 1332 04/02/20 1434   04/02/20 0900  remdesivir 100 mg in sodium chloride 0.9 % 100 mL IVPB       "Followed by" Linked Group Details   100 mg 200 mL/hr over 30 Minutes Intravenous  Once 04/02/20 0658 04/02/20 1251   04/02/20 0800  remdesivir 100 mg in sodium chloride 0.9 % 100 mL IVPB       "Followed by" Linked Group Details   100 mg 200 mL/hr over 30 Minutes Intravenous  Once 04/02/20 0658 04/02/20 0853   04/02/20 0700  remdesivir 200 mg in sodium chloride 0.9% 250 mL IVPB  Status:  Discontinued       "Followed by" Linked Group Details   200 mg 580 mL/hr over 30 Minutes Intravenous Once 04/02/20 0654 04/02/20 0656         Medications  Scheduled Meds: . acetaminophen  650 mg Oral Q6H   Or  . acetaminophen (TYLENOL) oral liquid 160 mg/5 mL  650 mg Oral Q6H  . acidophilus  2 capsule Oral Daily  . albuterol  2 puff Inhalation Q6H  . bisacodyl  5 mg Oral Daily  . busPIRone  10 mg Oral TID  . Chlorhexidine Gluconate Cloth  6 each Topical Daily  . dextromethorphan-guaiFENesin  1 tablet Oral BID  . enoxaparin (LOVENOX) injection  40 mg Subcutaneous Q24H  . feeding supplement  237 mL Oral TID BM  . insulin aspart  0-5 Units Subcutaneous QHS  . insulin aspart  0-9 Units Subcutaneous TID WC  . lidocaine  1 patch Transdermal Q24H  . multivitamin with minerals  1 tablet Oral Daily  . nicotine  14 mg Transdermal Daily  . pantoprazole  40 mg Oral Daily  . senna-docusate  2 tablet Oral BID  . traZODone  100 mg Oral QHS   Continuous Infusions: . sodium chloride    . sodium chloride 10 mL/hr at 04/07/20 1600  . cefTRIAXone (ROCEPHIN)  IV 2 g (04/10/20 1034)   PRN Meds:.Place/Maintain arterial line **AND**  sodium chloride, albuterol, ALPRAZolam, guaiFENesin-dextromethorphan, HYDROmorphone (DILAUDID) injection, hydrOXYzine, ondansetron (ZOFRAN) IV, oxyCODONE, sodium chloride flush, zolpidem       Objective:   Vitals:   04/10/20 0447 04/10/20 0800 04/10/20 0801 04/10/20 1128  BP: 101/65 95/72 95/69  103/73  Pulse: (!) 102 (!) 109 94 (!) 103  Resp: 20 20 18 14   Temp: 97.8 F (36.6 C) 97.8 F (36.6 C) 97.8 F (36.6 C) 98.3 F (36.8 C)  TempSrc: Oral Oral Oral Oral  SpO2: 100% 98% 99% 99%  Weight:      Height:        Intake/Output Summary (Last 24 hours) at 04/10/2020 1153 Last data filed at 04/10/2020 0847 Gross per 24 hour  Intake 820 ml  Output 125 ml  Net 695 ml     Wt Readings from Last 3 Encounters:  04/02/20 50.2 kg  05/27/19 58.1 kg  05/26/19 58.1 kg     Exam  Awake Alert, Oriented X 3, frail , no new F.N deficits, Normal affect Symmetrical Chest wall movement, Good air movement bilaterally, Minister air entry in the left lung with coarse respiratory sounds RRR +ve B.Sounds, Abd Soft, No tenderness, No rebound - guarding or rigidity. No Cyanosis, Clubbing or edema, No new Rash or bruise      Data Reviewed:  I have personally reviewed following labs and imaging studies  Micro Results Recent Results (from the past 240 hour(s))  Resp Panel by RT-PCR (Flu A&B, Covid) Nasopharyngeal Swab     Status: Abnormal   Collection Time: 04/01/20 10:42 PM   Specimen: Nasopharyngeal Swab; Nasopharyngeal(NP) swabs in vial transport medium  Result Value Ref Range Status   SARS Coronavirus 2 by RT PCR POSITIVE (A) NEGATIVE Final    Comment: RESULT CALLED TO, READ BACK BY AND VERIFIED WITH: T WALKER,RN@2337  04/01/20 MKELLY (NOTE) SARS-CoV-2 target nucleic acids are DETECTED.  The SARS-CoV-2 RNA is generally detectable in upper respiratory specimens during the acute phase of infection. Positive results are indicative of the presence of the identified virus, but do not  rule out bacterial infection or co-infection with other pathogens not detected by the test. Clinical correlation with patient history and other diagnostic information is necessary to determine patient infection status. The expected result is Negative.  Fact Sheet for Patients: BloggerCourse.com  Fact Sheet for Healthcare Providers: SeriousBroker.it  This test is not yet approved or cleared by the [place] FDA and  has been authorized for detection and/or diagnosis of SARS-CoV-2 by FDA under an Emergency Use Authorization (EUA).  This EUA will remain in effect (meaning this test can be  used) for the duration of  the COVID-19 declaration under Section 564(b)(1) of the Act, 21 U.S.C. section 360bbb-3(b)(1), unless the authorization is terminated or revoked sooner.     Influenza A by PCR NEGATIVE NEGATIVE Final   Influenza B by PCR NEGATIVE NEGATIVE Final    Comment: (NOTE) The Xpert Xpress SARS-CoV-2/FLU/RSV plus assay is intended as an aid in the diagnosis of influenza from Nasopharyngeal swab specimens and should not be used as a sole basis for treatment. Nasal washings and aspirates are unacceptable for Xpert Xpress SARS-CoV-2/FLU/RSV testing.  Fact Sheet for Patients: BloggerCourse.com  Fact Sheet for Healthcare Providers: SeriousBroker.it  This test is not yet approved or cleared by the [place] FDA and has been authorized for detection and/or diagnosis of SARS-CoV-2 by FDA under an Emergency Use Authorization (EUA). This EUA will remain in effect (meaning this test can be used) for the duration of the COVID-19 declaration under Section 564(b)(1) of the Act, 21 U.S.C. section 360bbb-3(b)(1), unless the authorization is terminated or revoked.  Performed at [facility], [address] [place], [place] [address]   Gram stain     Status: None   Collection Time:  04/02/20 11:40 AM   Specimen: Pleura; Body Fluid  Result Value Ref Range Status   Specimen Description PLEURAL  Final   Special Requests NONE  Final   Gram Stain   Final    GRAM POSITIVE COCCI Gram Stain Report Called to,Read Back By and Verified With: EASTER,T@1417  by MATTHEWS, B 12.17.21 WBC PRESENT,BOTH PMN AND MONONUCLEAR Performed at [facility], [address], [place], [place] [address]    Report Status 04/02/2020 FINAL  Final  Culture, body fluid-bottle     Status: Abnormal   Collection Time: 04/02/20 11:40 AM   Specimen: Pleura  Result Value Ref Range Status   Specimen Description   Final    PLEURAL Performed at [facility], [address], [place], [place] [address]    Special Requests   Final    NONE Performed at [facility], [address], [place], [place] [address]    Gram Stain   Final    GRAM POSITIVE COCCI AEROBIC BOTTLE Gram Stain Report Called to,Read Back By and Verified With: EASTER,T ON 04/02/20 AT 2040 BY LOY,C  Performed at [facility] ANAEROBIC BOTTLE ALSO Performed at [facility], [address], [place], [place] [address]    Culture STREPTOCOCCUS PNEUMONIAE (A)  Final   Report Status 04/05/2020 FINAL  Final   Organism ID, Bacteria STREPTOCOCCUS PNEUMONIAE  Final      Susceptibility   Streptococcus pneumoniae - MIC*    ERYTHROMYCIN <=0.12 SENSITIVE Sensitive     LEVOFLOXACIN 1 SENSITIVE Sensitive     VANCOMYCIN 0.5 SENSITIVE Sensitive     PENICILLIN (meningitis) <=0.06 SENSITIVE Sensitive     PENO - penicillin <=0.06      PENICILLIN (non-meningitis) <=0.06 SENSITIVE Sensitive     PENICILLIN (oral) <=0.06 SENSITIVE Sensitive     CEFTRIAXONE (non-meningitis) <=0.12 SENSITIVE Sensitive     CEFTRIAXONE (meningitis) <=0.12 SENSITIVE Sensitive     * STREPTOCOCCUS PNEUMONIAE  Culture, blood (Routine X 2) w Reflex to ID Panel     Status: None   Collection Time: 04/02/20  2:48 PM   Specimen: BLOOD  Result Value Ref Range Status   Specimen  Description BLOOD LEFT ANTECUBITAL  Final   Special Requests   Final    BOTTLES DRAWN AEROBIC AND ANAEROBIC Blood Culture adequate volume   Culture   Final    NO GROWTH 5 DAYS Performed at [facility], [address], [place], [place] [address]    Report Status 04/08/2020 FINAL  Final  Culture, blood (Routine X 2) w Reflex to ID Panel     Status: None   Collection Time: 04/02/20  9:54 PM   Specimen: BLOOD  Result Value Ref Range Status   Specimen Description BLOOD RIGHT HAND  Final   Special Requests   Final    BOTTLES DRAWN AEROBIC ONLY Blood Culture adequate volume   Culture   Final    NO GROWTH 5 DAYS Performed at [facility], [address] [place], [place] [address]    Report Status 04/07/2020 FINAL  Final  Culture, blood (Routine X 2) w Reflex to ID Panel     Status: None   Collection Time: 04/02/20  9:54 PM   Specimen: BLOOD  Result Value Ref Range Status   Specimen Description BLOOD RIGHT HAND  Final   Special Requests   Final    BOTTLES DRAWN AEROBIC ONLY Blood Culture adequate volume   Culture   Final    NO GROWTH 5 DAYS Performed at [facility], [address], [place], [place] [address]    Report Status 04/07/2020 FINAL  Final  MRSA PCR Screening     Status: None   Collection Time: 04/03/20  7:11 AM   Specimen: Nasal Mucosa; Nasopharyngeal  Result Value Ref Range Status   MRSA by PCR NEGATIVE NEGATIVE Final    Comment:        The GeneXpert MRSA Assay (FDA approved for NASAL specimens only), is one component of a comprehensive MRSA colonization surveillance program. It is not intended to diagnose MRSA infection nor to guide or monitor treatment for MRSA infections. Performed at [facility], [address] [place], [place] [address]     Radiology Reports DG Chest 1 View  Result Date: 04/09/2020 CLINICAL DATA:  34 year old female with pneumothorax status post chest tube placement. EXAM: CHEST  1 VIEW COMPARISON:  Chest radiograph  dated 04/09/2020 and CT dated 04/02/2020 FINDINGS: Two left sided chest tubes appear in similar position. No significant interval change in the size of the left pneumothorax compared to the prior radiograph. Left  subclavian central venous catheter in similar position. Bilobed nodular density in the right mid lung field as seen previously. Stable cardiomediastinal silhouette. No acute osseous pathology. IMPRESSION: No significant interval change in the size of the left pneumothorax compared to the prior radiograph. Two left-sided chest tubes appear in similar position. Electronically Signed   By: Elgie Collard M.D.   On: 04/09/2020 19:10   CT Angio Chest PE W and/or Wo Contrast  Result Date: 04/02/2020 CLINICAL DATA:  COVID exposure, dyspnea, cough, malaise, positive D-dimer EXAM: CT ANGIOGRAPHY CHEST WITH CONTRAST TECHNIQUE: Multidetector CT imaging of the chest was performed using the standard protocol during bolus administration of intravenous contrast. Multiplanar CT image reconstructions and MIPs were obtained to evaluate the vascular anatomy. CONTRAST:  OMNIPAQUE IOHEXOL 350 MG/ML SOLN COMPARISON:  None. FINDINGS: Cardiovascular: There is adequate opacification of the pulmonary arterial tree. There is no intraluminal filling defect identified to suggest acute pulmonary embolism. The central pulmonary arteries are of normal caliber. There is marked mediastinal shift to the right. Global cardiac size within normal limits. No significant coronary artery calcification. No pericardial effusion. The thoracic aorta is unremarkable. Mediastinum/Nodes: Thyroid unremarkable. Soft tissue within the anterior mediastinum likely represents rebound thymic or residual thymic tissue. The esophagus is unremarkable. No pathologic thoracic adenopathy is identified. Lungs/Pleura: There is a massive left pleural effusion which completely fills the left hemithorax, completely collapses the left lung, and demonstrates  marked mass effect upon the mediastinum with marked mediastinal shift to the right. There is interstitial gas within a portion of the left lower lobe likely representing the lateral segment of the left lower lobe which may represent necrosis of the setting of necrotizing pneumonia. Mild patchy ground-glass infiltrate within the a right upper lobe anteriorly is nonspecific, possibly infectious or inflammatory. A bilobed fluid density structure is seen within the right middle lobe measuring 2.3 x 3.6 x 1.6 cm in greatest dimension, nonspecific, but possibly the sequela of prior infection or trauma. Upper Abdomen: No acute abnormality. Musculoskeletal: No acute bone abnormality. Review of the MIP images confirms the above findings. IMPRESSION: No pulmonary embolism. Massive left pleural effusion, possibly representing a a parapneumonic effusion or empyema, demonstrating marked mass effect upon the mediastinum with marked left right mediastinal shift. Extensive interstitial gas within the probable lateral segment of the right lower lobe suggesting parenchymal necrosis in the setting of necrotizing pneumonia. Bilobed fluid-filled structure within the right middle lobe measuring 3.6 cm possibly representing the sequela of remote trauma or inflammation. Minimal patchy infiltrate within the right upper lobe, likely infectious or inflammatory. Electronically Signed   By: Helyn Numbers MD   On: 04/02/2020 04:18   DG Chest Port 1 View  Result Date: 04/09/2020 CLINICAL DATA:  Vats/empyema. Left-sided chest tube placement. COVID positive. EXAM: PORTABLE CHEST 1 VIEW COMPARISON:  04/08/2020 FINDINGS: Left subclavian line tip at mid SVC. Mild tracheal deviation to the left. Normal heart size. Two left chest tubes are unchanged in position. Decrease in subcutaneous emphysema about the left chest wall. Moderate left-sided hydropneumothorax is similar. Interstitial thickening throughout the right lung. Persistent right midlung  nodular opacity. IMPRESSION: Relatively similar appearance of the chest since 1 day prior. Left-sided hydropneumothorax with 2 chest tubes in place. Minimal decrease in subcutaneous emphysema. Right midlung nodular opacity, similar. Electronically Signed   By: Jeronimo Greaves M.D.   On: 04/09/2020 13:07   DG CHEST PORT 1 VIEW  Result Date: 04/08/2020 CLINICAL DATA:  Follow-up chest tube EXAM: PORTABLE CHEST 1 VIEW  COMPARISON:  Radiograph 04/05/2020 FINDINGS: *Left subclavian approach central venous catheter tip terminates at the left brachiocephalic-caval confluence. *There are 2 left apically directed chest tubes which remain in place. *Telemetry leads and external support devices overlie the chest. There is a persistent complex left hydropneumothorax, not significantly changed in size from prior with persistent apical capping and lateral basal air lucency. Some more focal opacity along the pleural surfaces, could reflect a developing pleural rind with more coalescent opacity in the left lung base though only partially re-expanded. Redemonstrated bilobed nodular opacity present in the right lung base as well with additional streaky opacities favoring residual atelectasis or edema. Cardiomediastinal contours are unchanged. Extensive subcutaneous emphysema across the left chest wall and base of the left neck. IMPRESSION: 1. Stable complex left hydropneumothorax with 2 chest tubes in place. Overall size is unchanged from prior. 2. Some more focal opacity along the pleural surfaces, could reflect a developing pleural rind with more coalescent opacity in the left lung base though only partially re-expanded. 3. Extensive subcutaneous emphysema across the left chest wall and base of the left neck. 4. Stable bilobed nodular opacity in the right middle lobe. Some additional atelectatic changes and/or edema bilaterally. Electronically Signed   By: Kreg Shropshire M.D.   On: 04/08/2020 22:04   DG CHEST PORT 1 VIEW  Result  Date: 04/08/2020 CLINICAL DATA:  Pneumothorax. EXAM: PORTABLE CHEST 1 VIEW COMPARISON:  04/07/2020 and prior. FINDINGS: Lateral left basilar/sub pulmonic pneumothorax is unchanged. Apically terminating dual left chest tubes. Left subclavian CVC is unchanged with tip overlying the azygos. Left lung volume loss and basilar opacities, similar prior exam. Clear right lung. IMPRESSION: Lateral left basilar/subpulmonic pneumothorax is unchanged. Indwelling apically terminating left chest tubes. Electronically Signed   By: Stana Bunting M.D.   On: 04/08/2020 09:06   DG Chest Port 1 View  Result Date: 04/07/2020 CLINICAL DATA:  Coronavirus infection.  Chest tube. EXAM: PORTABLE CHEST 1 VIEW COMPARISON:  04/06/2020 FINDINGS: Left subclavian central line tip the level of the azygos vein as seen previously. Two left chest tubes remain in place. Less pleural air than was seen yesterday. Small to moderate left pleural air collection does persist. Persistent infiltrate and volume loss in the left lung. Small amount of pleural fluid on the left. Few mild patchy infiltrates in the right lung appear similar. IMPRESSION: Less pleural air on the left than was seen yesterday. Small to moderate left pleural air collection does persist. Persistent infiltrate and volume loss in the left lung. Few mild patchy infiltrates in the right lung appear similar. Electronically Signed   By: Paulina Fusi M.D.   On: 04/07/2020 08:09   DG CHEST PORT 1 VIEW  Result Date: 04/06/2020 CLINICAL DATA:  Chest tube status post VATS.  COVID positive. EXAM: PORTABLE CHEST 1 VIEW COMPARISON:  04/05/2020.  CT 04/02/2020. FINDINGS: Left subclavian line and 2 left chest tubes in stable position. Interim slight progression of left-sided pneumothorax. Stable left-sided pleural thickening. Persistent left lung atelectasis/infiltrate. Persistent density noted over the right lower chest. This density was noted to be within the right lung base on prior  CT. This could represent a collection of pleural fluid and or atelectasis. Heart size stable. Left chest wall and supraclavicular subcutaneous emphysema again noted. IMPRESSION: 1. Stable positioning of left subclavian line and 2 left chest tubes. Interim slight progression of left-sided pneumothorax. Left chest wall and supraclavicular subcutaneous emphysema again noted without interim change. 2. Persistent left lung atelectasis/infiltrate. Stable left-sided pleural thickening.  3. Persistent density noted the right lower chest consistent with fluid pseudotumor and or atelectasis. Electronically Signed   By: Maisie Fus  Register   On: 04/06/2020 06:42   DG Chest Port 1 View  Result Date: 04/05/2020 CLINICAL DATA:  Chest tube in place. EXAM: PORTABLE CHEST 1 VIEW COMPARISON:  04/05/2020. FINDINGS: Left IJ line noted with tip over SVC. Interval removal of left pigtail chest tube. Interval placement of 2 large bore chest tubes. Interval near complete resolution of large left pleural effusion. Small pneumothorax noted. Atelectasis/infiltrate left lung. Density noted over the right lung may be overlying the chest. Heart size normal. Left chest wall subcutaneous emphysema. IMPRESSION: 1. Interim removal of left pigtail chest tube. Interval placement of 2 large bore chest tubes with near complete resolution of large left pleural effusion. Small left pneumothorax noted. 2. Associated atelectasis/infiltrate left lung. These results will be called to the ordering clinician or representative by the Radiologist Assistant, and communication documented in the PACS or [employer]. Electronically Signed   By: Maisie Fus  Register   On: 04/05/2020 16:26   DG Chest Port 1 View  Result Date: 04/05/2020 CLINICAL DATA:  Shortness of breath, COVID-19 positivity EXAM: PORTABLE CHEST 1 VIEW COMPARISON:  04/04/2020 FINDINGS: Cardiac shadow is obscured by the large left empyema. Right lung remains clear. Left hemithorax demonstrates  opacification relatively similar to that seen on the prior exam. Pigtail catheter is noted in place. Slight improved aeration in the left upper lobe is noted. No bony abnormality is seen. IMPRESSION: Slight improved aeration on the left. Pigtail catheter remains in place with relatively CT stable left empyema. Electronically Signed   By: Alcide Clever M.D.   On: 04/05/2020 09:04   DG Chest Port 1 View  Result Date: 04/04/2020 CLINICAL DATA:  Shortness of breath.  History of COVID. EXAM: PORTABLE CHEST 1 VIEW COMPARISON:  Chest radiograph 04/02/2020. FINDINGS: Monitoring leads overlie the patient. Cardiac and mediastinal contours largely obscured. Interval insertion left chest tube. Interval decrease in size of left pleural fluid within the upper hemithorax and mid hemithorax. Similar-appearing nodular opacity right mid lung. IMPRESSION: 1. Interval insertion left chest tube with interval decrease in size of left pleural fluid. 2. Similar-appearing nodular opacity right mid lung. Electronically Signed   By: Annia Belt M.D.   On: 04/04/2020 10:46   DG Chest Portable 1 View  Result Date: 04/02/2020 CLINICAL DATA:  Large LEFT pleural effusion EXAM: PORTABLE CHEST 1 VIEW COMPARISON:  Portable exam at 1203 compared to 0227 hrs FINDINGS: Resolution of previously identified LEFT to RIGHT mediastinal shift. Unable to assess heart size due to subtotal opacification of the LEFT hemithorax. Minimal aeration now seen in LEFT lung. No pneumothorax. Nodular foci at inferior RIGHT lung again seen with minimal RIGHT basilar atelectasis. Osseous structures unremarkable. IMPRESSION: No pneumothorax following LEFT thoracentesis. Resolution of LEFT to RIGHT mediastinal shift. Persistent nodular foci at inferior RIGHT hemithorax. Electronically Signed   By: Ulyses Southward M.D.   On: 04/02/2020 12:32   DG Chest Port 1 View  Result Date: 04/02/2020 CLINICAL DATA:  COVID, shortness of breath EXAM: PORTABLE CHEST 1 VIEW  COMPARISON:  None. FINDINGS: Complete whiteout of the left hemithorax, likely related to a combination of effusion and airspace disease. Heart and mediastinal structures are shifted to the right. Right mid lung patchy airspace disease. No effusion on the right. No acute bony abnormality. IMPRESSION: IMPRESSION Complete opacification of the left hemithorax, likely related to effusion and airspace disease. Patchy right mid lung  airspace disease. Electronically Signed   By: Charlett Nose M.D.   On: 04/02/2020 02:48   CT [facility] PLEURAL DRAIN W/INDWELL CATH W/IMG GUIDE  Result Date: 04/03/2020 CLINICAL DATA:  Large left pleural empyema, COVID-19 infection and history of IV drug use. Request for percutaneous thoracostomy tube placement to drain the empyema. EXAM: CT GUIDED CATHETER DRAINAGE OF LEFT PLEURAL EMPYEMA ANESTHESIA/SEDATION: 2.0 mg IV Versed 100 mcg IV Fentanyl Total Moderate Sedation Time:  10 minutes The patient's level of consciousness and physiologic status were continuously monitored during the procedure by Radiology nursing. PROCEDURE: The procedure, risks, benefits, and alternatives were explained to the patient. Questions regarding the procedure were encouraged and answered. The patient understands and consents to the procedure. A time out was performed prior to initiating the procedure. CT was performed through the chest in a supine position. The left chest wall was prepped with chlorhexidine in a sterile fashion, and a sterile drape was applied covering the operative field. A sterile gown and sterile gloves were used for the procedure. Local anesthesia was provided with 1% Lidocaine. Under CT guidance, an 18 gauge trocar needle was advanced into the left pleural space from an anterolateral approach. After confirming needle tip position, fluid was aspirated. A guidewire was advanced and the needle removed. The percutaneous tract was dilated and a 14 French pigtail drainage catheter advanced into the  left pleural space. Catheter position was confirmed by CT. The pigtail catheter was connected to a Pleur-evac device. The catheter was secured at the skin with a Prolene retention suture, StatLock device and dressed with a Vaseline gauze dressing. COMPLICATIONS: None FINDINGS: A large amount of residual left pleural fluid remains despite large volume thoracentesis yesterday. Aspiration from the pleural space yielded grossly purulent fluid. Repeat laboratory analysis was not performed as the fluid removed at the time of thoracentesis yesterday was sent for laboratory testing. After placement of the 14 French drainage catheter, there is good return of purulent fluid. The Pleur-evac will be connected to wall suction at -20 cm of water when the patient returns to her hospital room. IMPRESSION: CT-guided placement of 14 French left chest tube. A 14 French drainage catheter was placed with return of purulent fluid. The drainage catheter was attached to a Pleur-evac device which will be attached to wall suction. Electronically Signed   By: Irish Lack M.D.   On: 04/03/2020 13:33   ECHOCARDIOGRAM LIMITED  Result Date: 04/03/2020    ECHOCARDIOGRAM LIMITED REPORT   Patient Name:   MONITA Stockdale Date of Exam: 04/03/2020 Medical Rec #:  [id number]     Height:       67.0 in Accession #:    [id number]    Weight:       110.7 lb Date of Birth:  08-25-1985     BSA:          1.573 m Patient Age:    34 years      BP:           109/79 mmHg Patient Gender: F             HR:           99 bpm. Exam Location:  Inpatient Procedure: Limited Echo, Limited Color Doppler and Cardiac Doppler Indications:    endocarditis  History:        Patient has no prior history of Echocardiogram examinations.                 Covid. Pleural effusion.; Risk  Factors:Current Smoker and IV                 drug use.  Sonographer:    Delcie Roch Referring Phys: [id number] COURAGE EMOKPAE  Sonographer Comments: Image acquisition challenging due to  respiratory motion. IMPRESSIONS  1. Left ventricular ejection fraction, by estimation, is 60 to 65%. The left ventricle has normal function.  2. Right ventricular systolic function is normal. The right ventricular size is normal.  3. A small pericardial effusion is present. The pericardial effusion is circumferential.  4. The mitral valve is normal in structure. No evidence of mitral valve regurgitation. No evidence of mitral stenosis.  5. The aortic valve was not well visualized. Aortic valve regurgitation is not visualized. No aortic stenosis is present.  6. The inferior vena cava is normal in size with greater than 50% respiratory variability, suggesting right atrial pressure of 3 mmHg. FINDINGS  Left Ventricle: Left ventricular ejection fraction, by estimation, is 60 to 65%. The left ventricle has normal function. Right Ventricle: The right ventricular size is normal. Right vetricular wall thickness was not well visualized. Right ventricular systolic function is normal. Pericardium: A small pericardial effusion is present. The pericardial effusion is circumferential. Mitral Valve: The mitral valve is normal in structure. No evidence of mitral valve stenosis. Tricuspid Valve: The tricuspid valve is normal in structure. Tricuspid valve regurgitation is not demonstrated. No evidence of tricuspid stenosis. Aortic Valve: The aortic valve was not well visualized. Aortic valve regurgitation is not visualized. No aortic stenosis is present. Pulmonic Valve: The pulmonic valve was normal in structure. Pulmonic valve regurgitation is not visualized. No evidence of pulmonic stenosis. Venous: The inferior vena cava is normal in size with greater than 50% respiratory variability, suggesting right atrial pressure of 3 mmHg. LEFT VENTRICLE PLAX 2D LVIDd:         3.90 cm LVIDs:         2.30 cm LV IVS:        0.70 cm LVOT diam:     1.60 cm LVOT Area:     2.01 cm  IVC IVC diam: 1.90 cm LEFT ATRIUM         Index LA diam:    2.10  cm 1.34 cm/m   AORTA Ao Root diam: 2.20 cm MITRAL VALVE MV Area (PHT): 5.23 cm    SHUNTS MV Decel Time: 145 msec    Systemic Diam: 1.60 cm MV E velocity: 88.70 cm/s MV A velocity: 61.70 cm/s MV E/A ratio:  1.44 Dina Rich MD Electronically signed by Dina Rich MD Signature Date/Time: 04/03/2020/2:31:28 PM    Final    US THORACENTESIS ASP PLEURAL SPACE W/IMG GUIDE  Result Date: 04/02/2020 INDICATION: Large LEFT pleural effusion EXAM: ULTRASOUND GUIDED DIAGNSOTIC AND THERAPEUTIC LEFT THORACENTESIS MEDICATIONS: None. COMPLICATIONS: None immediate. PROCEDURE: Procedure, benefits, and risks of procedure were discussed with patient. Written informed consent for procedure was obtained. Time out protocol followed. Pleural effusion localized by ultrasound at the posterior LEFT hemithorax. Fluid is markedly complex containing diffuse internal echogenicity as well as dependent echogenicity. Skin prepped and draped in usual sterile fashion. Skin and soft tissues anesthetized with 10 mL of 1% lidocaine. 8 French thoracentesis catheter placed into the LEFT pleural space. 1.88 L of complex cloudy tan fluid with particulates with purulent appearance aspirated by syringe pump. Findings most likely represent empyema. Procedure tolerated well by patient without immediate complication. FINDINGS: Complex purulent appearing LEFT pleural fluid likely representing empyema. IMPRESSION: Successful ultrasound guided LEFT thoracentesis yielding  1.88 L of pleural fluid. Electronically Signed   By: Ulyses Southward M.D.   On: 04/02/2020 12:39    Lab Data:  CBC: Recent Labs  Lab 04/04/20 0415 04/05/20 0342 04/06/20 0518 04/07/20 0549 04/08/20 0051 04/09/20 0214 04/10/20 0945  WBC 6.0 7.3 14.1* 12.1* 12.4* 9.8 11.9*  NEUTROABS 3.7 5.2  --   --   --   --   --   HGB 12.3 10.6* 10.7* 10.6* 9.4* 9.8* 11.1*  HCT 38.7 32.9* 32.3* 31.4* 27.7* 29.7* 34.3*  MCV 85.4 86.4 85.2 84.6 82.9 84.6 84.9  PLT 485* 584* 585* 564* 505*  480* 621*   Basic Metabolic Panel: Recent Labs  Lab 04/04/20 0415 04/05/20 0342 04/06/20 0518 04/07/20 0549 04/08/20 0051 04/09/20 0214 04/10/20 0945  NA 136 139 136 137 136 137 136  K 4.0 4.4 4.0 3.7 3.2* 4.3 4.8  CL 101 106 103 104 108 105 102  CO2 25 25 26 25 22 25 23   GLUCOSE 121* 114* 71 102* 87 116* 72  BUN 17 13 13 13 9 11 7   CREATININE 0.73 0.76 0.60 0.60 0.49 0.56 0.52  CALCIUM 7.9* 7.5* 7.7* 7.9* 6.7* 8.0* 8.6*  MG 1.8 1.7  --  1.7  --   --   --   PHOS 3.1 3.1  --   --   --   --   --    GFR: Estimated Creatinine Clearance: 80 mL/min (by C-G formula based on SCr of 0.52 mg/dL). Liver Function Tests: Recent Labs  Lab 04/05/20 0342 04/07/20 0549 04/08/20 0051 04/09/20 0214 04/10/20 0945  AST 19 18 12* 14* 14*  ALT 21 16 14 14 16   ALKPHOS 40 46 41 49 55  BILITOT 0.3 0.4 0.3 0.5 0.4  PROT 5.2* 4.9* 4.2* 4.8* 6.5  ALBUMIN 1.6* 1.6* 1.4* 1.6* 2.1*   No results for input(s): LIPASE, AMYLASE in the last 168 hours. No results for input(s): AMMONIA in the last 168 hours. Coagulation Profile: No results for input(s): INR, PROTIME in the last 168 hours. Cardiac Enzymes: No results for input(s): CKTOTAL, CKMB, CKMBINDEX, TROPONINI in the last 168 hours. BNP (last 3 results) No results for input(s): PROBNP in the last 8760 hours. HbA1C: No results for input(s): HGBA1C in the last 72 hours. CBG: Recent Labs  Lab 04/09/20 1205 04/09/20 1647 04/09/20 2050 04/10/20 0804 04/10/20 1125  GLUCAP 100* 78 87 108* 102*   Lipid Profile: No results for input(s): CHOL, HDL, LDLCALC, TRIG, CHOLHDL, LDLDIRECT in the last 72 hours. Thyroid Function Tests: No results for input(s): TSH, T4TOTAL, FREET4, T3FREE, THYROIDAB in the last 72 hours. Anemia Panel: No results for input(s): VITAMINB12, FOLATE, FERRITIN, TIBC, IRON, RETICCTPCT in the last 72 hours. Urine analysis: No results found for: COLORURINE, APPEARANCEUR, LABSPEC, PHURINE, GLUCOSEU, HGBUR, BILIRUBINUR,  KETONESUR, PROTEINUR, UROBILINOGEN, NITRITE, 04/11/20 Syann Cupples M.D. [facility] 04/10/2020, 11:53 AM   Call night coverage person covering after 7pm

## 2020-04-11 LAB — CBC
HCT: 31.3 % — ABNORMAL LOW (ref 36.0–46.0)
Hemoglobin: 10.2 g/dL — ABNORMAL LOW (ref 12.0–15.0)
MCH: 28.2 pg (ref 26.0–34.0)
MCHC: 32.6 g/dL (ref 30.0–36.0)
MCV: 86.5 fL (ref 80.0–100.0)
Platelets: 534 10*3/uL — ABNORMAL HIGH (ref 150–400)
RBC: 3.62 MIL/uL — ABNORMAL LOW (ref 3.87–5.11)
RDW: 15.5 % (ref 11.5–15.5)
WBC: 10.6 10*3/uL — ABNORMAL HIGH (ref 4.0–10.5)
nRBC: 0 % (ref 0.0–0.2)

## 2020-04-11 LAB — COMPREHENSIVE METABOLIC PANEL
ALT: 15 U/L (ref 0–44)
AST: 15 U/L (ref 15–41)
Albumin: 1.9 g/dL — ABNORMAL LOW (ref 3.5–5.0)
Alkaline Phosphatase: 55 U/L (ref 38–126)
Anion gap: 11 (ref 5–15)
BUN: 9 mg/dL (ref 6–20)
CO2: 26 mmol/L (ref 22–32)
Calcium: 8.6 mg/dL — ABNORMAL LOW (ref 8.9–10.3)
Chloride: 101 mmol/L (ref 98–111)
Creatinine, Ser: 0.59 mg/dL (ref 0.44–1.00)
GFR, Estimated: >60 mL/min (ref >60)
Glucose, Bld: 82 mg/dL (ref 70–99)
Potassium: 4.6 mmol/L (ref 3.5–5.1)
Sodium: 138 mmol/L (ref 135–145)
Total Bilirubin: 0.5 mg/dL (ref 0.3–1.2)
Total Protein: 6 g/dL — ABNORMAL LOW (ref 6.5–8.1)

## 2020-04-11 LAB — GLUCOSE, CAPILLARY
Glucose-Capillary: 98 mg/dL (ref 70–99)
Glucose-Capillary: 102 mg/dL — ABNORMAL HIGH (ref 70–99)
Glucose-Capillary: 94 mg/dL (ref 70–99)
Glucose-Capillary: 110 mg/dL — ABNORMAL HIGH (ref 70–99)

## 2020-04-11 MED ORDER — METOPROLOL TARTRATE 12.5 MG HALF TABLET
12.5000 mg | ORAL_TABLET | Freq: Two times a day (BID) | ORAL | Status: DC
  Administered 2020-04-11 – 2020-04-30 (×32): 12.5 mg via ORAL
  Filled 2020-04-11 (×35): qty 1

## 2020-04-11 NOTE — Progress Notes (Signed)
[facility]                                                                              Patient Demographics  Alyssa Vargas, is a 34 y.o. female, DOB - 31-Oct-1985, ZOX:[id number]  Admit date - 04/01/2020   Admitting Physician Courage Mariea Clonts, MD  Outpatient Primary MD for the patient is Patient, No Pcp Per  Outpatient specialists:   LOS - 9  days   Medical records reviewed and are as summarized below:    Chief Complaint  Patient presents with  . Covid Exposure       Brief summary   34 yo WF with H/o IVDU (iv Heroin and iv Methamphetamine) and Tobacco abuse admitted on 04/02/2020 to [facility] with dyspnea, productive cough, loss of sense of taste and smell, anorexia, generalized aches, pains, malaise fatigue and myalgias and found to be positive for COVID-19 infection with chest x-ray consistent with very very large left-sided pleural effusion with mediastinal shift,-left-sided thoracentesis on 04/02/2020 you did purulent fluid consistent with empyema with Peptococcus pneumonia, she had CT guided chest tube insertion by IR as well, she was seen by CT surgery for which she had VATS with empyema drainage and decortication on 04/05/2020.  Subjective:   Alyssa Vargas was seen and examined today, port pain and should you have insertion site much improved today    Assessment & Plan   Massive left-sided empyema lung with mediastinal shift -Large left-sided empyema, CT surgery, Dr. Tyrone Sage was consulted by Dr Mariea Clonts, recommended IR to place large bore chest tube to drain the purulent/empyema fluid. -initially  on IV Zosyn and vancomycin, this has been noted to Rocephin once culture were available and showing Streptococcus pneumonia -Status post left-sided thoracentesis on 12/17, 1.88 L purulent appearing fluid removed, CT surgery input greatly appreciated, upon further evaluation decision has been made to proceed with a VATS surgery, . -Status post 14 French drain  placement in the left pleural empyema by IR on 12/18. - VATS with empyema drainage and decortication on 04/05/2020. -Chest tube management/pneumothorax per CT surgery, she remains with significant drainage, to 30 cc for last 24 hours, she still with air leak, so plan is to continue chest tube to suction, and repeat x-ray in a.m. Marland Kitchen -Pain at chest tube insertion site much better controlled on current regimen of year-end Dilaudid and oxycodone .  Acute hypoxic respiratory failure  -Multifactorial due to acute COVID-19 viral pneumonia and due to large left-sided empyema -recieved  IV steroids for COVID-19 of pneumonia, has rapidly tapered R hypoxia thought to be mainly due to empyema. -Resolved, on room air -Treated with Remdesivir.  COVID-19 infection -treated  with IV steroids and Remdesivir  Lab Results  Component Value Date   SARSCOV2NAA POSITIVE (A) 04/01/2020     Recent Labs  Lab 04/05/20 0342 04/07/20 0549 04/08/20 0051 04/09/20 0214 04/10/20 0945 04/11/20 0324  DDIMER 1.85*  --   --   --   --   --   FERRITIN 191  --   --   --   --   --   CRP 1.0*  --   --   --   --   --  ALT History of IV drug use (IV heroin, methamphetamine) -Patient reported she last used IV methamphetamine and heroin on 0 mg 100 mL/hr over 60 Minutes Intravenous Every 12 hours 04/02/20 1459 04/03/20 1500   04/02/20 2200  piperacillin-tazobactam (ZOSYN) IVPB 3.375 g  Status:  Discontinued        3.375 g 12.5 mL/hr over 240 Minutes Intravenous Every 8 hours 04/02/20 1332 04/05/20 1023   04/02/20 1515  vancomycin (VANCOREADY) IVPB 1250 mg/250 mL  Status:  Discontinued         1,250 mg 166.7 mL/hr over 90 Minutes Intravenous Every 24 hours 04/02/20 1449 04/02/20 1449   04/02/20 1515  vancomycin (VANCOREADY) IVPB 1250 mg/250 mL        1,250 mg 166.7 mL/hr over 90 Minutes Intravenous  Once  04/02/20 1449 04/02/20 1701   04/02/20 1345  piperacillin-tazobactam (ZOSYN) IVPB 3.375 g        3.375 g 100 mL/hr over 30 Minutes Intravenous  Once 04/02/20 1332 04/02/20 1434   04/02/20 0900  remdesivir 100 mg in sodium chloride 0.9 % 100 mL IVPB       "Followed by" Linked Group Details   100 mg 200 mL/hr over 30 Minutes Intravenous  Once 04/02/20 0658 04/02/20 1251   04/02/20 0800  remdesivir 100 mg in sodium chloride 0.9 % 100 mL IVPB       "Followed by" Linked Group Details   100 mg 200 mL/hr over 30 Minutes Intravenous  Once 04/02/20 0658 04/02/20 0853   04/02/20 0700  remdesivir 200 mg in sodium chloride 0.9% 250 mL IVPB  Status:  Discontinued       "Followed by" Linked Group Details   200 mg 580 mL/hr over 30 Minutes Intravenous Once 04/02/20 0654 04/02/20 0656         Medications  Scheduled Meds: . acidophilus  2 capsule Oral Daily  . albuterol  2 puff Inhalation Q6H  . bisacodyl  5 mg Oral Daily  . busPIRone  10 mg Oral TID  . Chlorhexidine Gluconate Cloth  6 each Topical Daily  . dextromethorphan-guaiFENesin  1 tablet Oral BID  . enoxaparin (LOVENOX) injection  40 mg Subcutaneous Q24H  . feeding supplement  237 mL Oral TID BM  . insulin aspart  0-5 Units Subcutaneous QHS  . insulin aspart  0-9 Units Subcutaneous TID WC  . lidocaine  1 patch Transdermal Q24H  . multivitamin with minerals  1 tablet Oral Daily  . nicotine  14 mg Transdermal Daily  . pantoprazole  40 mg Oral Daily  . senna-docusate  2 tablet Oral BID  . traZODone  100 mg Oral QHS   Continuous Infusions: . sodium chloride    . sodium chloride 10 mL/hr at 04/07/20 1600  . cefTRIAXone (ROCEPHIN)  IV 2 g (04/11/20 1003)   PRN Meds:.Place/Maintain arterial line **AND** sodium  chloride, albuterol, ALPRAZolam, guaiFENesin-dextromethorphan, HYDROmorphone (DILAUDID) injection, hydrOXYzine, ondansetron (ZOFRAN) IV, oxyCODONE, sodium chloride flush, zolpidem       Objective:   Vitals:   04/11/20 0443 04/11/20 0727 04/11/20 0900 04/11/20 1202  BP: 94/69 93/62 105/66 103/70  Pulse: (!) 109 (!) 103 (!) 110 (!) 116  Resp: 20 20 20 20   Temp: 99.1 F (37.3 C) 98.9 F (37.2 C) 98.9 F (37.2 C) 98.7 F (37.1 C)  TempSrc: Oral Oral Oral Oral  SpO2: 96% 95% 91% 94%  Weight:      Height:        Intake/Output Summary (Last 24 hours) at 04/11/2020 1431 Last data filed at 04/11/2020 1426 Gross per 24 hour  Intake 640 ml  Output 230 ml  Net 410 ml     Wt Readings from Last 3 Encounters:  04/02/20 50.2 kg  05/27/19 58.1 kg  05/26/19 58.1 kg     Exam  Awake Alert, Oriented X 3, frail, no new F.N deficits, Normal affect Symmetrical Chest wall movement, Good air movement bilaterally, coarse respiratory sounds in the left lung RRR,No Gallops. +ve B.Sounds, Abd Soft, No tenderness, No rebound - guarding or rigidity. No Cyanosis, Clubbing or edema, No new Rash or bruise       Data Reviewed:  I have personally reviewed following labs and imaging studies  Micro Results Recent Results (from the past 240 hour(s))  Resp Panel by RT-PCR (Flu A&B,  Covid) Nasopharyngeal Swab     Status: Abnormal   Collection Time: 04/01/20 10:42 PM   Specimen: Nasopharyngeal Swab; Nasopharyngeal(NP) swabs in vial transport medium  Result Value Ref Range Status   SARS Coronavirus 2 by RT PCR POSITIVE (A) NEGATIVE Final    Comment: RESULT CALLED TO, READ BACK BY AND VERIFIED WITH: T WALKER,RN@2337  04/01/20 MKELLY (NOTE) SARS-CoV-2 target nucleic acids are DETECTED.  The SARS-CoV-2 RNA is generally detectable in upper respiratory specimens during the acute phase of infection. Positive results are indicative of the presence of the identified virus, but do not rule out  bacterial infection or co-infection with other pathogens not detected by the test. Clinical correlation with patient history and other diagnostic information is necessary to determine patient infection status. The expected result is Negative.  Fact Sheet for Patients: BloggerCourse.comhttps://www.fda.gov/media/152166/download  Fact Sheet for Healthcare Providers: SeriousBroker.ithttps://www.fda.gov/media/152162/download  This test is not yet approved or cleared by the [place] FDA and  has been authorized for detection and/or diagnosis of SARS-CoV-2 by FDA under an Emergency Use Authorization (EUA).  This EUA will remain in effect (meaning this test can be  used) for the duration of  the COVID-19 declaration under Section 564(b)(1) of the Act, 21 U.S.C. section 360bbb-3(b)(1), unless the authorization is terminated or revoked sooner.     Influenza A by PCR NEGATIVE NEGATIVE Final   Influenza B by PCR NEGATIVE NEGATIVE Final    Comment: (NOTE) The Xpert Xpress SARS-CoV-2/FLU/RSV plus assay is intended as an aid in the diagnosis of influenza from Nasopharyngeal swab specimens and should not be used as a sole basis for treatment. Nasal washings and aspirates are unacceptable for Xpert Xpress SARS-CoV-2/FLU/RSV testing.  Fact Sheet for Patients: BloggerCourse.comhttps://www.fda.gov/media/152166/download  Fact Sheet for Healthcare Providers: SeriousBroker.ithttps://www.fda.gov/media/152162/download  This test is not yet approved or cleared by the [place] FDA and has been authorized for detection and/or diagnosis of SARS-CoV-2 by FDA under an Emergency Use Authorization (EUA). This EUA will remain in effect (meaning this test can be used) for the duration of the COVID-19 declaration under Section 564(b)(1) of the Act, 21 U.S.C. section 360bbb-3(b)(1), unless the authorization is terminated or revoked.  Performed at [facility], [address] [place], [place] [address]   Gram stain     Status: None   Collection Time: 04/02/20  11:40 AM   Specimen: Pleura; Body Fluid  Result Value Ref Range Status   Specimen Description PLEURAL  Final   Special Requests NONE  Final   Gram Stain   Final    GRAM POSITIVE COCCI Gram Stain Report Called to,Read Back By and Verified With: EASTER,T@1417  by MATTHEWS, B 12.17.21 WBC PRESENT,BOTH PMN AND MONONUCLEAR Performed at [facility], [address] [place], [place] [address]    Report Status 04/02/2020 FINAL  Final  Culture, body fluid-bottle     Status: Abnormal   Collection Time: 04/02/20 11:40 AM   Specimen: Pleura  Result Value Ref Range Status   Specimen Description   Final    PLEURAL Performed at [facility], [address] [place], [place] [address]    Special Requests   Final    NONE Performed at [facility], [address] [place], [place] [address]    Gram Stain   Final    GRAM POSITIVE COCCI AEROBIC BOTTLE Gram Stain Report Called to,Read Back By and Verified With: EASTER,T ON 04/02/20 AT 2040 BY LOY,C Performed at [facility] ANAEROBIC BOTTLE ALSO Performed at [facility], [address]  [place], [place] [address]    Culture STREPTOCOCCUS PNEUMONIAE (A)  Final   Report Status 04/05/2020 FINAL  Final   Organism ID, Bacteria STREPTOCOCCUS PNEUMONIAE  Final      Susceptibility   Streptococcus pneumoniae - MIC*    ERYTHROMYCIN <=0.12 SENSITIVE Sensitive     LEVOFLOXACIN 1 SENSITIVE Sensitive     VANCOMYCIN 0.5 SENSITIVE Sensitive     PENICILLIN (meningitis) <=0.06 SENSITIVE Sensitive     PENO - penicillin <=0.06      PENICILLIN (non-meningitis) <=0.06 SENSITIVE Sensitive     PENICILLIN (oral) <=0.06 SENSITIVE Sensitive     CEFTRIAXONE (non-meningitis) <=0.12 SENSITIVE Sensitive     CEFTRIAXONE (meningitis) <=0.12 SENSITIVE Sensitive     * STREPTOCOCCUS PNEUMONIAE  Culture, blood (Routine X 2) w Reflex to ID Panel     Status: None   Collection Time: 04/02/20  2:48 PM   Specimen: BLOOD  Result Value Ref Range Status   Specimen Description  BLOOD LEFT ANTECUBITAL  Final   Special Requests   Final    BOTTLES DRAWN AEROBIC AND ANAEROBIC Blood Culture adequate volume   Culture   Final    NO GROWTH 5 DAYS Performed at [facility], [address], [place], [place] [address]    Report Status 04/08/2020 FINAL  Final  Culture, blood (Routine X 2) w Reflex to ID Panel     Status: None   Collection Time: 04/02/20  9:54 PM   Specimen: BLOOD  Result Value Ref Range Status   Specimen Description BLOOD RIGHT HAND  Final   Special Requests   Final    BOTTLES DRAWN AEROBIC ONLY Blood Culture adequate volume   Culture   Final    NO GROWTH 5 DAYS Performed at [facility], [address] [place], [place] [address]    Report Status 04/07/2020 FINAL  Final  Culture, blood (Routine X 2) w Reflex to ID Panel     Status: None   Collection Time: 04/02/20  9:54 PM   Specimen: BLOOD  Result Value Ref Range Status   Specimen Description BLOOD RIGHT HAND  Final   Special Requests   Final    BOTTLES DRAWN AEROBIC ONLY Blood Culture adequate volume   Culture   Final    NO GROWTH 5 DAYS Performed at [facility], [address], [place], [place] [address]    Report Status 04/07/2020 FINAL  Final  MRSA PCR Screening     Status: None   Collection Time: 04/03/20  7:11 AM   Specimen: Nasal Mucosa; Nasopharyngeal  Result Value Ref Range Status   MRSA by PCR NEGATIVE NEGATIVE Final    Comment:        The GeneXpert MRSA Assay (FDA approved for NASAL specimens only), is one component of a comprehensive MRSA colonization surveillance program. It is not intended to diagnose MRSA infection nor to guide or monitor treatment for MRSA infections. Performed at [facility], [address], [place], [place] [address]     Radiology Reports DG Chest 1 View  Result Date: 04/09/2020 CLINICAL DATA:  33 year old female with pneumothorax status post chest tube placement. EXAM: CHEST  1 VIEW COMPARISON:  Chest radiograph dated  04/09/2020 and CT dated 04/02/2020 FINDINGS: Two left sided chest tubes appear in similar position. No significant interval change in the size of the left pneumothorax compared to the prior radiograph. Left subclavian central venous catheter in similar position. Bilobed nodular density in the right mid lung field  as seen previously. Stable cardiomediastinal silhouette. No acute osseous pathology. IMPRESSION: No significant interval change in the size of the left pneumothorax compared to the prior radiograph. Two left-sided chest tubes appear in similar position. Electronically Signed   By: Elgie Collard M.D.   On: 04/09/2020 19:10   CT Angio Chest PE W and/or Wo Contrast  Result Date: 04/02/2020 CLINICAL DATA:  COVID exposure, dyspnea, cough, malaise, positive D-dimer EXAM: CT ANGIOGRAPHY CHEST WITH CONTRAST TECHNIQUE: Multidetector CT imaging of the chest was performed using the standard protocol during bolus administration of intravenous contrast. Multiplanar CT image reconstructions and MIPs were obtained to evaluate the vascular anatomy. CONTRAST:  OMNIPAQUE IOHEXOL 350 MG/ML SOLN COMPARISON:  None. FINDINGS: Cardiovascular: There is adequate opacification of the pulmonary arterial tree. There is no intraluminal filling defect identified to suggest acute pulmonary embolism. The central pulmonary arteries are of normal caliber. There is marked mediastinal shift to the right. Global cardiac size within normal limits. No significant coronary artery calcification. No pericardial effusion. The thoracic aorta is unremarkable. Mediastinum/Nodes: Thyroid unremarkable. Soft tissue within the anterior mediastinum likely represents rebound thymic or residual thymic tissue. The esophagus is unremarkable. No pathologic thoracic adenopathy is identified. Lungs/Pleura: There is a massive left pleural effusion which completely fills the left hemithorax, completely collapses the left lung, and demonstrates marked  mass effect upon the mediastinum with marked mediastinal shift to the right. There is interstitial gas within a portion of the left lower lobe likely representing the lateral segment of the left lower lobe which may represent necrosis of the setting of necrotizing pneumonia. Mild patchy ground-glass infiltrate within the a right upper lobe anteriorly is nonspecific, possibly infectious or inflammatory. A bilobed fluid density structure is seen within the right middle lobe measuring 2.3 x 3.6 x 1.6 cm in greatest dimension, nonspecific, but possibly the sequela of prior infection or trauma. Upper Abdomen: No acute abnormality. Musculoskeletal: No acute bone abnormality. Review of the MIP images confirms the above findings. IMPRESSION: No pulmonary embolism. Massive left pleural effusion, possibly representing a a parapneumonic effusion or empyema, demonstrating marked mass effect upon the mediastinum with marked left right mediastinal shift. Extensive interstitial gas within the probable lateral segment of the right lower lobe suggesting parenchymal necrosis in the setting of necrotizing pneumonia. Bilobed fluid-filled structure within the right middle lobe measuring 3.6 cm possibly representing the sequela of remote trauma or inflammation. Minimal patchy infiltrate within the right upper lobe, likely infectious or inflammatory. Electronically Signed   By: Helyn Numbers MD   On: 04/02/2020 04:18   DG Chest Port 1 View  Result Date: 04/10/2020 CLINICAL DATA:  Chest tube in place EXAM: PORTABLE CHEST 1 VIEW COMPARISON:  Yesterday FINDINGS: Loculated appearing basal and lateral left pneumothorax, possibly ex vacuo given the degree of pleural disease, unchanged. Chest tubes are in stable position. Normal heart size. Right base nodularity is unchanged. IMPRESSION: Unchanged loculated appearing pneumothorax on the left. Electronically Signed   By: Marnee Spring M.D.   On: 04/10/2020 08:50   DG Chest Port 1  View  Result Date: 04/09/2020 CLINICAL DATA:  Vats/empyema. Left-sided chest tube placement. COVID positive. EXAM: PORTABLE CHEST 1 VIEW COMPARISON:  04/08/2020 FINDINGS: Left subclavian line tip at mid SVC. Mild tracheal deviation to the left. Normal heart size. Two left chest tubes are unchanged in position. Decrease in subcutaneous emphysema about the left chest wall. Moderate left-sided hydropneumothorax is similar. Interstitial thickening throughout the right lung. Persistent right midlung nodular opacity. IMPRESSION:  Relatively similar appearance of the chest since 1 day prior. Left-sided hydropneumothorax with 2 chest tubes in place. Minimal decrease in subcutaneous emphysema. Right midlung nodular opacity, similar. Electronically Signed   By: Jeronimo Greaves M.D.   On: 04/09/2020 13:07   DG CHEST PORT 1 VIEW  Result Date: 04/08/2020 CLINICAL DATA:  Follow-up chest tube EXAM: PORTABLE CHEST 1 VIEW COMPARISON:  Radiograph 04/05/2020 FINDINGS: *Left subclavian approach central venous catheter tip terminates at the left brachiocephalic-caval confluence. *There are 2 left apically directed chest tubes which remain in place. *Telemetry leads and external support devices overlie the chest. There is a persistent complex left hydropneumothorax, not significantly changed in size from prior with persistent apical capping and lateral basal air lucency. Some more focal opacity along the pleural surfaces, could reflect a developing pleural rind with more coalescent opacity in the left lung base though only partially re-expanded. Redemonstrated bilobed nodular opacity present in the right lung base as well with additional streaky opacities favoring residual atelectasis or edema. Cardiomediastinal contours are unchanged. Extensive subcutaneous emphysema across the left chest wall and base of the left neck. IMPRESSION: 1. Stable complex left hydropneumothorax with 2 chest tubes in place. Overall size is unchanged from  prior. 2. Some more focal opacity along the pleural surfaces, could reflect a developing pleural rind with more coalescent opacity in the left lung base though only partially re-expanded. 3. Extensive subcutaneous emphysema across the left chest wall and base of the left neck. 4. Stable bilobed nodular opacity in the right middle lobe. Some additional atelectatic changes and/or edema bilaterally. Electronically Signed   By: Kreg Shropshire M.D.   On: 04/08/2020 22:04   DG CHEST PORT 1 VIEW  Result Date: 04/08/2020 CLINICAL DATA:  Pneumothorax. EXAM: PORTABLE CHEST 1 VIEW COMPARISON:  04/07/2020 and prior. FINDINGS: Lateral left basilar/sub pulmonic pneumothorax is unchanged. Apically terminating dual left chest tubes. Left subclavian CVC is unchanged with tip overlying the azygos. Left lung volume loss and basilar opacities, similar prior exam. Clear right lung. IMPRESSION: Lateral left basilar/subpulmonic pneumothorax is unchanged. Indwelling apically terminating left chest tubes. Electronically Signed   By: Stana Bunting M.D.   On: 04/08/2020 09:06   DG Chest Port 1 View  Result Date: 04/07/2020 CLINICAL DATA:  Coronavirus infection.  Chest tube. EXAM: PORTABLE CHEST 1 VIEW COMPARISON:  04/06/2020 FINDINGS: Left subclavian central line tip the level of the azygos vein as seen previously. Two left chest tubes remain in place. Less pleural air than was seen yesterday. Small to moderate left pleural air collection does persist. Persistent infiltrate and volume loss in the left lung. Small amount of pleural fluid on the left. Few mild patchy infiltrates in the right lung appear similar. IMPRESSION: Less pleural air on the left than was seen yesterday. Small to moderate left pleural air collection does persist. Persistent infiltrate and volume loss in the left lung. Few mild patchy infiltrates in the right lung appear similar. Electronically Signed   By: Paulina Fusi M.D.   On: 04/07/2020 08:09   DG  CHEST PORT 1 VIEW  Result Date: 04/06/2020 CLINICAL DATA:  Chest tube status post VATS.  COVID positive. EXAM: PORTABLE CHEST 1 VIEW COMPARISON:  04/05/2020.  CT 04/02/2020. FINDINGS: Left subclavian line and 2 left chest tubes in stable position. Interim slight progression of left-sided pneumothorax. Stable left-sided pleural thickening. Persistent left lung atelectasis/infiltrate. Persistent density noted over the right lower chest. This density was noted to be within the right lung base on prior  CT. This could represent a collection of pleural fluid and or atelectasis. Heart size stable. Left chest wall and supraclavicular subcutaneous emphysema again noted. IMPRESSION: 1. Stable positioning of left subclavian line and 2 left chest tubes. Interim slight progression of left-sided pneumothorax. Left chest wall and supraclavicular subcutaneous emphysema again noted without interim change. 2. Persistent left lung atelectasis/infiltrate. Stable left-sided pleural thickening. 3. Persistent density noted the right lower chest consistent with fluid pseudotumor and or atelectasis. Electronically Signed   By: Maisie Fus  Register   On: 04/06/2020 06:42   DG Chest Port 1 View  Result Date: 04/05/2020 CLINICAL DATA:  Chest tube in place. EXAM: PORTABLE CHEST 1 VIEW COMPARISON:  04/05/2020. FINDINGS: Left IJ line noted with tip over SVC. Interval removal of left pigtail chest tube. Interval placement of 2 large bore chest tubes. Interval near complete resolution of large left pleural effusion. Small pneumothorax noted. Atelectasis/infiltrate left lung. Density noted over the right lung may be overlying the chest. Heart size normal. Left chest wall subcutaneous emphysema. IMPRESSION: 1. Interim removal of left pigtail chest tube. Interval placement of 2 large bore chest tubes with near complete resolution of large left pleural effusion. Small left pneumothorax noted. 2. Associated atelectasis/infiltrate left lung. These  results will be called to the ordering clinician or representative by the Radiologist Assistant, and communication documented in the PACS or [employer]. Electronically Signed   By: Maisie Fus  Register   On: 04/05/2020 16:26   DG Chest Port 1 View  Result Date: 04/05/2020 CLINICAL DATA:  Shortness of breath, COVID-19 positivity EXAM: PORTABLE CHEST 1 VIEW COMPARISON:  04/04/2020 FINDINGS: Cardiac shadow is obscured by the large left empyema. Right lung remains clear. Left hemithorax demonstrates opacification relatively similar to that seen on the prior exam. Pigtail catheter is noted in place. Slight improved aeration in the left upper lobe is noted. No bony abnormality is seen. IMPRESSION: Slight improved aeration on the left. Pigtail catheter remains in place with relatively CT stable left empyema. Electronically Signed   By: Alcide Clever M.D.   On: 04/05/2020 09:04   DG Chest Port 1 View  Result Date: 04/04/2020 CLINICAL DATA:  Shortness of breath.  History of COVID. EXAM: PORTABLE CHEST 1 VIEW COMPARISON:  Chest radiograph 04/02/2020. FINDINGS: Monitoring leads overlie the patient. Cardiac and mediastinal contours largely obscured. Interval insertion left chest tube. Interval decrease in size of left pleural fluid within the upper hemithorax and mid hemithorax. Similar-appearing nodular opacity right mid lung. IMPRESSION: 1. Interval insertion left chest tube with interval decrease in size of left pleural fluid. 2. Similar-appearing nodular opacity right mid lung. Electronically Signed   By: Annia Belt M.D.   On: 04/04/2020 10:46   DG Chest Portable 1 View  Result Date: 04/02/2020 CLINICAL DATA:  Large LEFT pleural effusion EXAM: PORTABLE CHEST 1 VIEW COMPARISON:  Portable exam at 1203 compared to 0227 hrs FINDINGS: Resolution of previously identified LEFT to RIGHT mediastinal shift. Unable to assess heart size due to subtotal opacification of the LEFT hemithorax. Minimal aeration now seen in  LEFT lung. No pneumothorax. Nodular foci at inferior RIGHT lung again seen with minimal RIGHT basilar atelectasis. Osseous structures unremarkable. IMPRESSION: No pneumothorax following LEFT thoracentesis. Resolution of LEFT to RIGHT mediastinal shift. Persistent nodular foci at inferior RIGHT hemithorax. Electronically Signed   By: Ulyses Southward M.D.   On: 04/02/2020 12:32   DG Chest Port 1 View  Result Date: 04/02/2020 CLINICAL DATA:  COVID, shortness of breath EXAM: PORTABLE CHEST 1  VIEW COMPARISON:  None. FINDINGS: Complete whiteout of the left hemithorax, likely related to a combination of effusion and airspace disease. Heart and mediastinal structures are shifted to the right. Right mid lung patchy airspace disease. No effusion on the right. No acute bony abnormality. IMPRESSION: IMPRESSION Complete opacification of the left hemithorax, likely related to effusion and airspace disease. Patchy right mid lung airspace disease. Electronically Signed   By: Charlett Nose M.D.   On: 04/02/2020 02:48   CT [facility] PLEURAL DRAIN W/INDWELL CATH W/IMG GUIDE  Result Date: 04/03/2020 CLINICAL DATA:  Large left pleural empyema, COVID-19 infection and history of IV drug use. Request for percutaneous thoracostomy tube placement to drain the empyema. EXAM: CT GUIDED CATHETER DRAINAGE OF LEFT PLEURAL EMPYEMA ANESTHESIA/SEDATION: 2.0 mg IV Versed 100 mcg IV Fentanyl Total Moderate Sedation Time:  10 minutes The patient's level of consciousness and physiologic status were continuously monitored during the procedure by Radiology nursing. PROCEDURE: The procedure, risks, benefits, and alternatives were explained to the patient. Questions regarding the procedure were encouraged and answered. The patient understands and consents to the procedure. A time out was performed prior to initiating the procedure. CT was performed through the chest in a supine position. The left chest wall was prepped with chlorhexidine in a sterile  fashion, and a sterile drape was applied covering the operative field. A sterile gown and sterile gloves were used for the procedure. Local anesthesia was provided with 1% Lidocaine. Under CT guidance, an 18 gauge trocar needle was advanced into the left pleural space from an anterolateral approach. After confirming needle tip position, fluid was aspirated. A guidewire was advanced and the needle removed. The percutaneous tract was dilated and a 14 French pigtail drainage catheter advanced into the left pleural space. Catheter position was confirmed by CT. The pigtail catheter was connected to a Pleur-evac device. The catheter was secured at the skin with a Prolene retention suture, StatLock device and dressed with a Vaseline gauze dressing. COMPLICATIONS: None FINDINGS: A large amount of residual left pleural fluid remains despite large volume thoracentesis yesterday. Aspiration from the pleural space yielded grossly purulent fluid. Repeat laboratory analysis was not performed as the fluid removed at the time of thoracentesis yesterday was sent for laboratory testing. After placement of the 14 French drainage catheter, there is good return of purulent fluid. The Pleur-evac will be connected to wall suction at -20 cm of water when the patient returns to her hospital room. IMPRESSION: CT-guided placement of 14 French left chest tube. A 14 French drainage catheter was placed with return of purulent fluid. The drainage catheter was attached to a Pleur-evac device which will be attached to wall suction. Electronically Signed   By: Irish Lack M.D.   On: 04/03/2020 13:33   ECHOCARDIOGRAM LIMITED  Result Date: 04/03/2020    ECHOCARDIOGRAM LIMITED REPORT   Patient Name:   BRI Cumberledge Date of Exam: 04/03/2020 Medical Rec #:  [id number]     Height:       67.0 in Accession #:    [id number]    Weight:       110.7 lb Date of Birth:  1985-07-05     BSA:          1.573 m Patient Age:    34 years      BP:            109/79 mmHg Patient Gender: F             HR:  99 bpm. Exam Location:  Inpatient Procedure: Limited Echo, Limited Color Doppler and Cardiac Doppler Indications:    endocarditis  History:        Patient has no prior history of Echocardiogram examinations.                 Covid. Pleural effusion.; Risk Factors:Current Smoker and IV                 drug use.  Sonographer:    Delcie Roch Referring Phys: [id number] COURAGE EMOKPAE  Sonographer Comments: Image acquisition challenging due to respiratory motion. IMPRESSIONS  1. Left ventricular ejection fraction, by estimation, is 60 to 65%. The left ventricle has normal function.  2. Right ventricular systolic function is normal. The right ventricular size is normal.  3. A small pericardial effusion is present. The pericardial effusion is circumferential.  4. The mitral valve is normal in structure. No evidence of mitral valve regurgitation. No evidence of mitral stenosis.  5. The aortic valve was not well visualized. Aortic valve regurgitation is not visualized. No aortic stenosis is present.  6. The inferior vena cava is normal in size with greater than 50% respiratory variability, suggesting right atrial pressure of 3 mmHg. FINDINGS  Left Ventricle: Left ventricular ejection fraction, by estimation, is 60 to 65%. The left ventricle has normal function. Right Ventricle: The right ventricular size is normal. Right vetricular wall thickness was not well visualized. Right ventricular systolic function is normal. Pericardium: A small pericardial effusion is present. The pericardial effusion is circumferential. Mitral Valve: The mitral valve is normal in structure. No evidence of mitral valve stenosis. Tricuspid Valve: The tricuspid valve is normal in structure. Tricuspid valve regurgitation is not demonstrated. No evidence of tricuspid stenosis. Aortic Valve: The aortic valve was not well visualized. Aortic valve regurgitation is not visualized. No aortic stenosis  is present. Pulmonic Valve: The pulmonic valve was normal in structure. Pulmonic valve regurgitation is not visualized. No evidence of pulmonic stenosis. Venous: The inferior vena cava is normal in size with greater than 50% respiratory variability, suggesting right atrial pressure of 3 mmHg. LEFT VENTRICLE PLAX 2D LVIDd:         3.90 cm LVIDs:         2.30 cm LV IVS:        0.70 cm LVOT diam:     1.60 cm LVOT Area:     2.01 cm  IVC IVC diam: 1.90 cm LEFT ATRIUM         Index LA diam:    2.10 cm 1.34 cm/m   AORTA Ao Root diam: 2.20 cm MITRAL VALVE MV Area (PHT): 5.23 cm    SHUNTS MV Decel Time: 145 msec    Systemic Diam: 1.60 cm MV E velocity: 88.70 cm/s MV A velocity: 61.70 cm/s MV E/A ratio:  1.44 Dina Rich MD Electronically signed by Dina Rich MD Signature Date/Time: 04/03/2020/2:31:28 PM    Final    US THORACENTESIS ASP PLEURAL SPACE W/IMG GUIDE  Result Date: 04/02/2020 INDICATION: Large LEFT pleural effusion EXAM: ULTRASOUND GUIDED DIAGNSOTIC AND THERAPEUTIC LEFT THORACENTESIS MEDICATIONS: None. COMPLICATIONS: None immediate. PROCEDURE: Procedure, benefits, and risks of procedure were discussed with patient. Written informed consent for procedure was obtained. Time out protocol followed. Pleural effusion localized by ultrasound at the posterior LEFT hemithorax. Fluid is markedly complex containing diffuse internal echogenicity as well as dependent echogenicity. Skin prepped and draped in usual sterile fashion. Skin and soft tissues anesthetized with 10 mL of 1% lidocaine.  8 French thoracentesis catheter placed into the LEFT pleural space. 1.88 L of complex cloudy tan fluid with particulates with purulent appearance aspirated by syringe pump. Findings most likely represent empyema. Procedure tolerated well by patient without immediate complication. FINDINGS: Complex purulent appearing LEFT pleural fluid likely representing empyema. IMPRESSION: Successful ultrasound guided LEFT thoracentesis  yielding 1.88 L of pleural fluid. Electronically Signed   By: Ulyses Southward M.D.   On: 04/02/2020 12:39    Lab Data:  CBC: Recent Labs  Lab 04/05/20 0342 04/06/20 0518 04/07/20 0549 04/08/20 0051 04/09/20 0214 04/10/20 0945 04/11/20 0324  WBC 7.3   < > 12.1* 12.4* 9.8 11.9* 10.6*  NEUTROABS 5.2  --   --   --   --   --   --   HGB 10.6*   < > 10.6* 9.4* 9.8* 11.1* 10.2*  HCT 32.9*   < > 31.4* 27.7* 29.7* 34.3* 31.3*  MCV 86.4   < > 84.6 82.9 84.6 84.9 86.5  PLT 584*   < > 564* 505* 480* 621* 534*   < > = values in this interval not displayed.   Basic Metabolic Panel: Recent Labs  Lab 04/05/20 0342 04/06/20 0518 04/07/20 0549 04/08/20 0051 04/09/20 0214 04/10/20 0945 04/11/20 0324  NA 139   < > 137 136 137 136 138  K 4.4   < > 3.7 3.2* 4.3 4.8 4.6  CL 106   < > 104 108 105 102 101  CO2 25   < > 25 22 25 23 26   GLUCOSE 114*   < > 102* 87 116* 72 82  BUN 13   < > 13 9 11 7 9   CREATININE 0.76   < > 0.60 0.49 0.56 0.52 0.59  CALCIUM 7.5*   < > 7.9* 6.7* 8.0* 8.6* 8.6*  MG 1.7  --  1.7  --   --   --   --   PHOS 3.1  --   --   --   --   --   --    < > = values in this interval not displayed.   GFR: Estimated Creatinine Clearance: 80 mL/min (by C-G formula based on SCr of 0.59 mg/dL). Liver Function Tests: Recent Labs  Lab 04/07/20 0549 04/08/20 0051 04/09/20 0214 04/10/20 0945 04/11/20 0324  AST 18 12* 14* 14* 15  ALT 16 14 14 16 15   ALKPHOS 46 41 49 55 55  BILITOT 0.4 0.3 0.5 0.4 0.5  PROT 4.9* 4.2* 4.8* 6.5 6.0*  ALBUMIN 1.6* 1.4* 1.6* 2.1* 1.9*   No results for input(s): LIPASE, AMYLASE in the last 168 hours. No results for input(s): AMMONIA in the last 168 hours. Coagulation Profile: No results for input(s): INR, PROTIME in the last 168 hours. Cardiac Enzymes: No results for input(s): CKTOTAL, CKMB, CKMBINDEX, TROPONINI in the last 168 hours. BNP (last 3 results) No results for input(s): PROBNP in the last 8760 hours. HbA1C: No results for input(s):  HGBA1C in the last 72 hours. CBG: Recent Labs  Lab 04/10/20 1125 04/10/20 1705 04/10/20 2050 04/11/20 0726 04/11/20 1206  GLUCAP 102* 107* 95 98 102*   Lipid Profile: No results for input(s): CHOL, HDL, LDLCALC, TRIG, CHOLHDL, LDLDIRECT in the last 72 hours. Thyroid Function Tests: No results for input(s): TSH, T4TOTAL, FREET4, T3FREE, THYROIDAB in the last 72 hours. Anemia Panel: No results for input(s): VITAMINB12, FOLATE, FERRITIN, TIBC, IRON, RETICCTPCT in the last 72 hours. Urine analysis: No results found for: COLORURINE, APPEARANCEUR, LABSPEC,  PHURINE, GLUCOSEU, HGBUR, BILIRUBINUR, KETONESUR, PROTEINUR, UROBILINOGEN, NITRITE, Jac Canavan M.D. [facility] 04/11/2020, 2:31 PM   Call night coverage person covering after 7pm

## 2020-04-11 NOTE — Progress Notes (Addendum)
      [address]       [place] [address]             [phone]      6 Days Post-Op Procedure(s) (LRB): VIDEO ASSISTED THORACOSCOPY (VATS)/EMPYEMA DRAINAGE, DECORTICATION  (Left) Subjective:  In the bedside chair talking on the phone. She had no new complaint or concern.   Objective: Vital signs in last 24 hours: Temp:  [97.8 F (36.6 C)-99.1 F (37.3 C)] 98.7 F (37.1 C) (12/26 1202) Pulse Rate:  [103-125] 116 (12/26 1202) Cardiac Rhythm: Sinus tachycardia (12/25 1900) Resp:  [16-24] 20 (12/26 1202) BP: (93-105)/(62-77) 103/70 (12/26 1202) SpO2:  [91 %-100 %] 94 % (12/26 1202)     Intake/Output from previous day: 12/25 0701 - 12/26 0700 In: 1220 [P.O.:1220] Out: 230 [Chest Tube:230] Intake/Output this shift: No intake/output data recorded.  General appearance: alert, cooperative and mild distress Neurologic: intact Lungs: Clear breath sounds on the right, diminished on the left.  Two large-bore chest tubes are secure. Small air leak persists. CT's drained past 24 hours.  Wound: CT insertion sites are covered with dry dressings.   Lab Results: Recent Labs    04/10/20 0945 04/11/20 0324  WBC 11.9* 10.6*  HGB 11.1* 10.2*  HCT 34.3* 31.3*  PLT 621* 534*   BMET:  Recent Labs    04/10/20 0945 04/11/20 0324  NA 136 138  K 4.8 4.6  CL 102 101  CO2 23 26  GLUCOSE 72 82  BUN 7 9  CREATININE 0.52 0.59  CALCIUM 8.6* 8.6*    PT/INR: No results for input(s): LABPROT, INR in the last 72 hours. ABG No results found for: PHART, HCO3, TCO2, ACIDBASEDEF, O2SAT CBG (last 3)  Recent Labs    04/10/20 2050 04/11/20 0726 04/11/20 1206  GLUCAP 95 98 102*    Assessment/Plan: S/P Procedure(s) (LRB): VIDEO ASSISTED THORACOSCOPY (VATS)/EMPYEMA DRAINAGE, DECORTICATION  (Left)  -POD-6 VATS, drainage of empyema, decortication in 34yo IVDU who was oringinally admitted [facility] 04/02/20 with COVID infection.  On Ceftriaxone for  strep pneumoniae cultured from pleural fluid at prior to transfer here. Continues to have significant drainage but it is not grossly purulent. Also has an active air leak. Will repeat the CXR in AM No change in mgt. from CTS standpoint. Leave the CT's to suction.     LOS: 9 days    Leary Roca, [place] [phone] 04/11/2020  Agree with above. Chest tube 230 cc for 24 hrs. + air leak. Continue to suction and repeat CXR in am.

## 2020-04-11 NOTE — Progress Notes (Addendum)
Patient tachycardic, with HR in high 120s - low 130s; patient asymptomatic. Per MD patient will receive Metoprolol 12.5 mg po BID. The first dose will be administered this afternoon.

## 2020-04-12 ENCOUNTER — Inpatient Hospital Stay (HOSPITAL_COMMUNITY): Payer: Medicaid Other

## 2020-04-12 DIAGNOSIS — J9601 Acute respiratory failure with hypoxia: Secondary | ICD-10-CM

## 2020-04-12 LAB — CBC
HCT: 30.3 % — ABNORMAL LOW (ref 36.0–46.0)
Hemoglobin: 10.2 g/dL — ABNORMAL LOW (ref 12.0–15.0)
MCH: 28.4 pg (ref 26.0–34.0)
MCHC: 33.7 g/dL (ref 30.0–36.0)
MCV: 84.4 fL (ref 80.0–100.0)
Platelets: 519 10*3/uL — ABNORMAL HIGH (ref 150–400)
RBC: 3.59 MIL/uL — ABNORMAL LOW (ref 3.87–5.11)
RDW: 15.3 % (ref 11.5–15.5)
WBC: 8.9 10*3/uL (ref 4.0–10.5)
nRBC: 0 % (ref 0.0–0.2)

## 2020-04-12 LAB — ECHOCARDIOGRAM LIMITED
Height: 67 in
S' Lateral: 3 cm
Weight: 1770.73 oz

## 2020-04-12 LAB — COMPREHENSIVE METABOLIC PANEL
ALT: 14 U/L (ref 0–44)
AST: 13 U/L — ABNORMAL LOW (ref 15–41)
Albumin: 2 g/dL — ABNORMAL LOW (ref 3.5–5.0)
Alkaline Phosphatase: 52 U/L (ref 38–126)
Anion gap: 8 (ref 5–15)
BUN: 12 mg/dL (ref 6–20)
CO2: 26 mmol/L (ref 22–32)
Calcium: 8.8 mg/dL — ABNORMAL LOW (ref 8.9–10.3)
Chloride: 101 mmol/L (ref 98–111)
Creatinine, Ser: 0.52 mg/dL (ref 0.44–1.00)
GFR, Estimated: >60 mL/min (ref >60)
Glucose, Bld: 95 mg/dL (ref 70–99)
Potassium: 4.2 mmol/L (ref 3.5–5.1)
Sodium: 135 mmol/L (ref 135–145)
Total Bilirubin: 0.3 mg/dL (ref 0.3–1.2)
Total Protein: 6.3 g/dL — ABNORMAL LOW (ref 6.5–8.1)

## 2020-04-12 LAB — GLUCOSE, CAPILLARY
Glucose-Capillary: 88 mg/dL (ref 70–99)
Glucose-Capillary: 97 mg/dL (ref 70–99)
Glucose-Capillary: 117 mg/dL — ABNORMAL HIGH (ref 70–99)
Glucose-Capillary: 111 mg/dL — ABNORMAL HIGH (ref 70–99)

## 2020-04-12 MED ORDER — METOPROLOL TARTRATE 5 MG/5ML IV SOLN
2.5000 mg | Freq: Once | INTRAVENOUS | Status: AC
  Administered 2020-04-13: 2.5 mg via INTRAVENOUS
  Filled 2020-04-12: qty 5

## 2020-04-12 MED ORDER — SODIUM CHLORIDE 0.9 % IV BOLUS
500.0000 mL | Freq: Once | INTRAVENOUS | Status: AC
  Administered 2020-04-13: 500 mL via INTRAVENOUS

## 2020-04-12 MED ORDER — SODIUM CHLORIDE 0.9 % IV BOLUS
500.0000 mL | Freq: Once | INTRAVENOUS | Status: AC
  Administered 2020-04-12: 500 mL via INTRAVENOUS

## 2020-04-12 MED ORDER — KETOROLAC TROMETHAMINE 30 MG/ML IJ SOLN
30.0000 mg | Freq: Once | INTRAMUSCULAR | Status: AC
  Administered 2020-04-13: 30 mg via INTRAVENOUS
  Filled 2020-04-12: qty 1

## 2020-04-12 MED ORDER — FENTANYL CITRATE (PF) 100 MCG/2ML IJ SOLN: 12.5000 ug | Freq: Once | INTRAMUSCULAR | Status: DC

## 2020-04-12 NOTE — Progress Notes (Addendum)
      [address]       [place] [address]             [phone]       7 Days Post-Op Procedure(s) (LRB): VIDEO ASSISTED THORACOSCOPY (VATS)/EMPYEMA DRAINAGE, DECORTICATION  (Left)  Subjective: Patient with pain at chest tube sites  Objective: Vital signs in last 24 hours: Temp:  [98 F (36.7 C)-98.9 F (37.2 C)] 98.5 F (36.9 C) (12/27 0731) Pulse Rate:  [93-116] 99 (12/27 0731) Cardiac Rhythm: Sinus tachycardia (12/27 0700) Resp:  [19-25] 19 (12/27 0731) BP: (91-105)/(62-82) 98/66 (12/27 0731) SpO2:  [91 %-98 %] 95 % (12/27 0731)      Intake/Output from previous day: 12/26 0701 - 12/27 0700 In: 1000 [P.O.:900; IV Piggyback:100] Out: 170 [Chest Tube:170]   Physical Exam:  Cardiovascular: RRR Pulmonary: Clear to auscultation on the right;audible rub/squeak on left (chest tubes) Abdomen: Soft, non tender, bowel sounds present. Extremities:No lower extremity edema. Wounds: Dressing is wound lean and dry.   Chest Tubes:to suction, small air leak that worsens with cough  Lab Results: ZYY:[id number] Labs    04/11/20 0324 04/12/20 0429  WBC 10.6* 8.9  HGB 10.2* 10.2*  HCT 31.3* 30.3*  PLT 534* 519*   BMET:  Recent Labs    04/11/20 0324 04/12/20 0429  NA 138 135  K 4.6 4.2  CL 101 101  CO2 26 26  GLUCOSE 82 95  BUN 9 12  CREATININE 0.59 0.52  CALCIUM 8.6* 8.8*    PT/INR: No results for input(s): LABPROT, INR in the last 72 hours. ABG:  INR: Will add last result for INR, ABG once components are confirmed Will add last 4 CBG results once components are confirmed  Assessment/Plan:  1. CV - SR/ST with HR into the 110's at times. On Lopressor 12.5 mg bid. Per primary 2.  Pulmonary - On room air. Chest tube with 170 cc last 24 hours. Chest tube has a small air leak that worsens with cough. CXR appears stable (stable small to moderate left hydropneumothorax, loculated). Chest tubes to remain for now but will increase suction. Encourage  incentive spirometer. 3. CBGs 94/110/88. On Insulin PRN 4. Anemia-H and H this am 10.2 and 30.3 5. ID-on Ceftriaxone 2 grams IV daily  Donielle M ZimmermanPA-C 04/12/2020,8:54 AM [phone]  Patient seen and examined, agree with above She has an air leak and a space with the l;eft lung being < 50% reexpanded. Air leak unlikely to stop in this setting. Will try increasing suction to 40 cm to see if the lung will reexpand any more.   Recommend at least 6 weeks total antibiotics- likely 2 weeks IV then 4 Po  Dejanae Helser C. Dorris Fetch, MD [facility] [phone]

## 2020-04-12 NOTE — Progress Notes (Signed)
  Echocardiogram 2D Echocardiogram has been performed.  Alyssa Vargas 04/12/2020, 2:59 PM

## 2020-04-12 NOTE — Progress Notes (Signed)
[facility]                                                                              Patient Demographics  Alyssa Vargas, is a 34 y.o. female, DOB - 06-22-1985, ZOX:[id number]  Admit date - 04/01/2020   Admitting Physician Courage Mariea Clonts, MD  Outpatient Primary MD for the patient is Patient, No Pcp Per  Outpatient specialists:   LOS - 10  days   Medical records reviewed and are as summarized below:    Chief Complaint  Patient presents with  . Covid Exposure       Brief summary   34 yo WF with H/o IVDU (iv Heroin and iv Methamphetamine) and Tobacco abuse admitted on 04/02/2020 to [facility] with dyspnea, productive cough, loss of sense of taste and smell, anorexia, generalized aches, pains, malaise fatigue and myalgias and found to be positive for COVID-19 infection with chest x-ray consistent with very very large left-sided pleural effusion with mediastinal shift,-left-sided thoracentesis on 04/02/2020 you did purulent fluid consistent with empyema with Peptococcus pneumonia, she had CT guided chest tube insertion by IR as well, she was seen by CT surgery for which she had VATS with empyema drainage and decortication on 04/05/2020.  Subjective:   Alyssa Vargas was seen and examined today, chills, she reports pain atchest tube insertion site controlled on current regiment .    Assessment & Plan   Massive left-sided empyema lung with mediastinal shift -Large left-sided empyema, CT surgery consulted -initially  on IV Zosyn and vancomycin, this has been noted to Rocephin once culture were available and showing Streptococcus pneumonia -Status post left-sided thoracentesis on 12/17, 1.88 L purulent appearing fluid removed, CT surgery input greatly appreciated, upon further evaluation decision has been made to proceed with a VATS surgery, . -Status post 14 French drain placement in the left pleural empyema by IR on 12/18. - VATS with empyema drainage and  decortication on 04/05/2020. -Chest tube management/pneumothorax per CT surgery, she remains with significant drainage, to 30 cc for last 24 hours, she still with air leak, so plan is to continue chest tube to suction, and repeat x-ray in a.m. Marland Kitchen -Pain at chest tube insertion site much better controlled on current regimen of year-end Dilaudid and oxycodone . -Discussed with ID, likely will need 2 weeks postoperative antibiotic for her empyema, will obtain CRP and procalcitonin.  Acute hypoxic respiratory failure  -Multifactorial due to acute COVID-19 viral pneumonia and due to large left-sided empyema -recieved  IV steroids for COVID-19 of pneumonia, has rapidly tapered R hypoxia thought to be mainly due to empyema. -Resolved, on room air -Treated with Remdesivir.  COVID-19 infection -treated  with IV steroids and Remdesivir  Lab Results  Component Value Date   SARSCOV2NAA POSITIVE (A) 04/01/2020     Recent Labs  Lab 04/08/20 0051 04/09/20 0214 04/10/20 0945 04/11/20 0324 04/12/20 0429  ALT 14 14 16 15 14     History of IV drug use (IV heroin, methamphetamine) -Patient reported she last used IV methamphetamine and heroin on Sunday, 03/28/20  Depression, anorexia -Denies suicidal or homicidal ideation or plan -Continue BuSpar   Hyponatremia -Resolved  with hydration.  Sinus tachycardia -She is started on low-dose metoprolol, will repeat 2D echo given there was some circumferential pericardial effusion.  Tobacco use -Nicotine patch placed  Protein calorie malnutrition -Continue with supplements   Code Status: Full CODE STATUS DVT Prophylaxis: Subcu Lovenox Family Communication: Discussed all imaging results, lab results, explained to the patient  Disposition Plan:     Status is: Inpatient  Remains inpatient appropriate because:Inpatient level of care appropriate due to severity of illness   Dispo: The patient is from: Home              Anticipated d/c is to:  Home              Anticipated d/c date is: > 3 days              Patient currently is not medically stable to d/c.        Procedures:  -Status post left-sided thoracentesis on 12/17, 1.88 L purulent appearing fluid removed, -Status post 14 French drain placement in the left pleural empyema by IR on 12/18. - VATS with empyema drainage and decortication on 04/05/2020. Consultants:   PCCM CT surgery Intervention radiology  Antimicrobials:   Anti-infectives (From admission, onward)   Start     Dose/Rate Route Frequency Ordered Stop   04/06/20 1045  cefTRIAXone (ROCEPHIN) 2 g in sodium chloride 0.9 % 100 mL IVPB        2 g 200 mL/hr over 30 Minutes Intravenous Every 24 hours 04/06/20 0945     04/05/20 1100  cefTRIAXone (ROCEPHIN) 2 g in sodium chloride 0.9 % 100 mL IVPB  Status:  Discontinued        2 g 200 mL/hr over 30 Minutes Intravenous Every 24 hours 04/05/20 1023 04/05/20 1453   04/03/20 1600  vancomycin (VANCOREADY) IVPB 750 mg/150 mL  Status:  Discontinued        750 mg 150 mL/hr over 60 Minutes Intravenous Every 12 hours 04/03/20 1500 04/05/20 1020   04/03/20 1000  remdesivir 100 mg in sodium chloride 0.9 % 100 mL IVPB  Status:  Discontinued       "Followed by" Linked Group Details   100 mg 200 mL/hr over 30 Minutes Intravenous Daily 04/02/20 0654 04/02/20 0656   04/03/20 1000  remdesivir 100 mg in sodium chloride 0.9 % 100 mL IVPB       "Followed by" Linked Group Details   100 mg 200 mL/hr over 30 Minutes Intravenous Daily 04/02/20 0658 04/06/20 0948   04/03/20 0400  vancomycin (VANCOREADY) IVPB 500 mg/100 mL  Status:  Discontinued        500 mg 100 mL/hr over 60 Minutes Intravenous Every 12 hours 04/02/20 1459 04/03/20 1500   04/02/20 2200  piperacillin-tazobactam (ZOSYN) IVPB 3.375 g  Status:  Discontinued        3.375 g 12.5 mL/hr over 240 Minutes Intravenous Every 8 hours 04/02/20 1332 04/05/20 1023   04/02/20 1515  vancomycin (VANCOREADY) IVPB 1250 mg/250 mL   Status:  Discontinued        1,250 mg 166.7 mL/hr over 90 Minutes Intravenous Every 24 hours 04/02/20 1449 04/02/20 1449   04/02/20 1515  vancomycin (VANCOREADY) IVPB 1250 mg/250 mL        1,250 mg 166.7 mL/hr over 90 Minutes Intravenous  Once 04/02/20 1449 04/02/20 1701   04/02/20 1345  piperacillin-tazobactam (ZOSYN) IVPB 3.375 g        3.375 g 100 mL/hr over 30 Minutes Intravenous  Once  04/02/20 1332 04/02/20 1434   04/02/20 0900  remdesivir 100 mg in sodium chloride 0.9 % 100 mL IVPB       "Followed by" Linked Group Details   100 mg 200 mL/hr over 30 Minutes Intravenous  Once 04/02/20 0658 04/02/20 1251   04/02/20 0800  remdesivir 100 mg in sodium chloride 0.9 % 100 mL IVPB       "Followed by" Linked Group Details   100 mg 200 mL/hr over 30 Minutes Intravenous  Once 04/02/20 0658 04/02/20 0853   04/02/20 0700  remdesivir 200 mg in sodium chloride 0.9% 250 mL IVPB  Status:  Discontinued       "Followed by" Linked Group Details   200 mg 580 mL/hr over 30 Minutes Intravenous Once 04/02/20 0654 04/02/20 0656         Medications  Scheduled Meds: . acidophilus  2 capsule Oral Daily  . albuterol  2 puff Inhalation Q6H  . bisacodyl  5 mg Oral Daily  . busPIRone  10 mg Oral TID  . Chlorhexidine Gluconate Cloth  6 each Topical Daily  . dextromethorphan-guaiFENesin  1 tablet Oral BID  . enoxaparin (LOVENOX) injection  40 mg Subcutaneous Q24H  . feeding supplement  237 mL Oral TID BM  . insulin aspart  0-5 Units Subcutaneous QHS  . insulin aspart  0-9 Units Subcutaneous TID WC  . lidocaine  1 patch Transdermal Q24H  . metoprolol tartrate  12.5 mg Oral BID  . multivitamin with minerals  1 tablet Oral Daily  . nicotine  14 mg Transdermal Daily  . pantoprazole  40 mg Oral Daily  . senna-docusate  2 tablet Oral BID  . traZODone  100 mg Oral QHS   Continuous Infusions: . sodium chloride    . sodium chloride 10 mL/hr at 04/07/20 1600  . cefTRIAXone (ROCEPHIN)  IV 2 g (04/12/20  0916)   PRN Meds:.Place/Maintain arterial line **AND** sodium chloride, albuterol, ALPRAZolam, guaiFENesin-dextromethorphan, HYDROmorphone (DILAUDID) injection, hydrOXYzine, ondansetron (ZOFRAN) IV, oxyCODONE, sodium chloride flush, zolpidem       Objective:   Vitals:   04/12/20 0000 04/12/20 0400 04/12/20 0731 04/12/20 1146  BP: 94/63  Pulse: 93 95 99 99  Resp: (!) Temp: 98 F (36.7 C) 98.1 F (36.7 C) 98.5 F (36.9 C) 98.8 F (37.1 C)  TempSrc: Oral Oral Oral Oral  SpO2: 96% 98% 95% 96%  Weight:      Height:        Intake/Output Summary (Last 24 hours) at 04/12/2020 1201 Last data filed at 04/12/2020 1008 Gross per 24 hour  Intake 900 ml  Output 170 ml  Net 730 ml     Wt Readings from Last 3 Encounters:  04/02/20 50.2 kg  05/27/19 58.1 kg  05/26/19 58.1 kg     Exam  Awake Alert, Oriented X 3,frail,  No new F.N deficits, Normal affect Symmetrical Chest wall movement, Good air movement bilaterally, coarse on the left side Tachycardic,No Gallops,Rubs or new Murmurs, No Parasternal Heave +ve B.Sounds, Abd Soft, No tenderness, No rebound - guarding or rigidity. No Cyanosis, Clubbing or edema, No new Rash or bruise      Data Reviewed:  I have personally reviewed following labs and imaging studies  Micro Results Recent Results (from the past 240 hour(s))  Culture, blood (Routine X 2) w Reflex to ID Panel     Status: None   Collection Time: 04/02/20  2:48 PM   Specimen: BLOOD  Result Value Ref Range Status   Specimen Description BLOOD LEFT ANTECUBITAL  Final   Special Requests   Final    BOTTLES DRAWN AEROBIC AND ANAEROBIC Blood Culture adequate volume   Culture   Final    NO GROWTH 5 DAYS Performed at [facility], [address] [place], [place] [address]    Report Status 04/08/2020 FINAL  Final  Culture, blood (Routine X 2) w Reflex to ID Panel     Status: None   Collection Time: 04/02/20  9:54 PM   Specimen: BLOOD   Result Value Ref Range Status   Specimen Description BLOOD RIGHT HAND  Final   Special Requests   Final    BOTTLES DRAWN AEROBIC ONLY Blood Culture adequate volume   Culture   Final    NO GROWTH 5 DAYS Performed at [facility], [address] [place], [place] [address]    Report Status 04/07/2020 FINAL  Final  Culture, blood (Routine X 2) w Reflex to ID Panel     Status: None   Collection Time: 04/02/20  9:54 PM   Specimen: BLOOD  Result Value Ref Range Status   Specimen Description BLOOD RIGHT HAND  Final   Special Requests   Final    BOTTLES DRAWN AEROBIC ONLY Blood Culture adequate volume   Culture   Final    NO GROWTH 5 DAYS Performed at [facility], [address] [place], [place] [address]    Report Status 04/07/2020 FINAL  Final  MRSA PCR Screening     Status: None   Collection Time: 04/03/20  7:11 AM   Specimen: Nasal Mucosa; Nasopharyngeal  Result Value Ref Range Status   MRSA by PCR NEGATIVE NEGATIVE Final    Comment:        The GeneXpert MRSA Assay (FDA approved for NASAL specimens only), is one component of a comprehensive MRSA colonization surveillance program. It is not intended to diagnose MRSA infection nor to guide or monitor treatment for MRSA infections. Performed at [facility], [address] [place], [place] [address]     Radiology Reports DG Chest 1 View  Result Date: 04/12/2020 CLINICAL DATA:  Empyema. EXAM: CHEST  1 VIEW COMPARISON:  April 10, 2020 FINDINGS: The cardiomediastinal silhouette is unchanged in contour. Multiple LEFT-sided chest tubes. Unchanged small to moderate LEFT hydropneumothorax with a loculated apical component. Persistent heterogeneous opacification of base and periphery of the LEFT lung. Visualized abdomen is unremarkable. No acute osseous abnormality. IMPRESSION: Unchanged small to moderate LEFT hydropneumothorax with a loculated apical component. Electronically Signed   By: Meda KlinefelterStephanie  Peacock MD    On: 04/12/2020 07:52   DG Chest 1 View  Result Date: 04/09/2020 CLINICAL DATA:  34 year old female with pneumothorax status post chest tube placement. EXAM: CHEST  1 VIEW COMPARISON:  Chest radiograph dated 04/09/2020 and CT dated 04/02/2020 FINDINGS: Two left sided chest tubes appear in similar position. No significant interval change in the size of the left pneumothorax compared to the prior radiograph. Left subclavian central venous catheter in similar position. Bilobed nodular density in the right mid lung field as seen previously. Stable cardiomediastinal silhouette. No acute osseous pathology. IMPRESSION: No significant interval change in the size of the left pneumothorax compared to the prior radiograph. Two left-sided chest tubes appear in similar position. Electronically Signed   By: Elgie CollardArash  Radparvar M.D.   On: 04/09/2020 19:10   CT Angio Chest PE W and/or Wo Contrast  Result Date: 04/02/2020  CLINICAL DATA:  COVID exposure, dyspnea, cough, malaise, positive D-dimer EXAM: CT ANGIOGRAPHY CHEST WITH CONTRAST TECHNIQUE: Multidetector CT imaging of the chest was performed using the standard protocol during bolus administration of intravenous contrast. Multiplanar CT image reconstructions and MIPs were obtained to evaluate the vascular anatomy. CONTRAST:  OMNIPAQUE IOHEXOL 350 MG/ML SOLN COMPARISON:  None. FINDINGS: Cardiovascular: There is adequate opacification of the pulmonary arterial tree. There is no intraluminal filling defect identified to suggest acute pulmonary embolism. The central pulmonary arteries are of normal caliber. There is marked mediastinal shift to the right. Global cardiac size within normal limits. No significant coronary artery calcification. No pericardial effusion. The thoracic aorta is unremarkable. Mediastinum/Nodes: Thyroid unremarkable. Soft tissue within the anterior mediastinum likely represents rebound thymic or residual thymic tissue. The esophagus is  unremarkable. No pathologic thoracic adenopathy is identified. Lungs/Pleura: There is a massive left pleural effusion which completely fills the left hemithorax, completely collapses the left lung, and demonstrates marked mass effect upon the mediastinum with marked mediastinal shift to the right. There is interstitial gas within a portion of the left lower lobe likely representing the lateral segment of the left lower lobe which may represent necrosis of the setting of necrotizing pneumonia. Mild patchy ground-glass infiltrate within the a right upper lobe anteriorly is nonspecific, possibly infectious or inflammatory. A bilobed fluid density structure is seen within the right middle lobe measuring 2.3 x 3.6 x 1.6 cm in greatest dimension, nonspecific, but possibly the sequela of prior infection or trauma. Upper Abdomen: No acute abnormality. Musculoskeletal: No acute bone abnormality. Review of the MIP images confirms the above findings. IMPRESSION: No pulmonary embolism. Massive left pleural effusion, possibly representing a a parapneumonic effusion or empyema, demonstrating marked mass effect upon the mediastinum with marked left right mediastinal shift. Extensive interstitial gas within the probable lateral segment of the right lower lobe suggesting parenchymal necrosis in the setting of necrotizing pneumonia. Bilobed fluid-filled structure within the right middle lobe measuring 3.6 cm possibly representing the sequela of remote trauma or inflammation. Minimal patchy infiltrate within the right upper lobe, likely infectious or inflammatory. Electronically Signed   By: Helyn Numbers MD   On: 04/02/2020 04:18   DG Chest Port 1 View  Result Date: 04/10/2020 CLINICAL DATA:  Chest tube in place EXAM: PORTABLE CHEST 1 VIEW COMPARISON:  Yesterday FINDINGS: Loculated appearing basal and lateral left pneumothorax, possibly ex vacuo given the degree of pleural disease, unchanged. Chest tubes are in stable position.  Normal heart size. Right base nodularity is unchanged. IMPRESSION: Unchanged loculated appearing pneumothorax on the left. Electronically Signed   By: Marnee Spring M.D.   On: 04/10/2020 08:50   DG Chest Port 1 View  Result Date: 04/09/2020 CLINICAL DATA:  Vats/empyema. Left-sided chest tube placement. COVID positive. EXAM: PORTABLE CHEST 1 VIEW COMPARISON:  04/08/2020 FINDINGS: Left subclavian line tip at mid SVC. Mild tracheal deviation to the left. Normal heart size. Two left chest tubes are unchanged in position. Decrease in subcutaneous emphysema about the left chest wall. Moderate left-sided hydropneumothorax is similar. Interstitial thickening throughout the right lung. Persistent right midlung nodular opacity. IMPRESSION: Relatively similar appearance of the chest since 1 day prior. Left-sided hydropneumothorax with 2 chest tubes in place. Minimal decrease in subcutaneous emphysema. Right midlung nodular opacity, similar. Electronically Signed   By: Jeronimo Greaves M.D.   On: 04/09/2020 13:07   DG CHEST PORT 1 VIEW  Result Date: 04/08/2020 CLINICAL DATA:  Follow-up chest tube EXAM: PORTABLE CHEST 1  VIEW COMPARISON:  Radiograph 04/05/2020 FINDINGS: *Left subclavian approach central venous catheter tip terminates at the left brachiocephalic-caval confluence. *There are 2 left apically directed chest tubes which remain in place. *Telemetry leads and external support devices overlie the chest. There is a persistent complex left hydropneumothorax, not significantly changed in size from prior with persistent apical capping and lateral basal air lucency. Some more focal opacity along the pleural surfaces, could reflect a developing pleural rind with more coalescent opacity in the left lung base though only partially re-expanded. Redemonstrated bilobed nodular opacity present in the right lung base as well with additional streaky opacities favoring residual atelectasis or edema. Cardiomediastinal contours  are unchanged. Extensive subcutaneous emphysema across the left chest wall and base of the left neck. IMPRESSION: 1. Stable complex left hydropneumothorax with 2 chest tubes in place. Overall size is unchanged from prior. 2. Some more focal opacity along the pleural surfaces, could reflect a developing pleural rind with more coalescent opacity in the left lung base though only partially re-expanded. 3. Extensive subcutaneous emphysema across the left chest wall and base of the left neck. 4. Stable bilobed nodular opacity in the right middle lobe. Some additional atelectatic changes and/or edema bilaterally. Electronically Signed   By: Kreg Shropshire M.D.   On: 04/08/2020 22:04   DG CHEST PORT 1 VIEW  Result Date: 04/08/2020 CLINICAL DATA:  Pneumothorax. EXAM: PORTABLE CHEST 1 VIEW COMPARISON:  04/07/2020 and prior. FINDINGS: Lateral left basilar/sub pulmonic pneumothorax is unchanged. Apically terminating dual left chest tubes. Left subclavian CVC is unchanged with tip overlying the azygos. Left lung volume loss and basilar opacities, similar prior exam. Clear right lung. IMPRESSION: Lateral left basilar/subpulmonic pneumothorax is unchanged. Indwelling apically terminating left chest tubes. Electronically Signed   By: Stana Bunting M.D.   On: 04/08/2020 09:06   DG Chest Port 1 View  Result Date: 04/07/2020 CLINICAL DATA:  Coronavirus infection.  Chest tube. EXAM: PORTABLE CHEST 1 VIEW COMPARISON:  04/06/2020 FINDINGS: Left subclavian central line tip the level of the azygos vein as seen previously. Two left chest tubes remain in place. Less pleural air than was seen yesterday. Small to moderate left pleural air collection does persist. Persistent infiltrate and volume loss in the left lung. Small amount of pleural fluid on the left. Few mild patchy infiltrates in the right lung appear similar. IMPRESSION: Less pleural air on the left than was seen yesterday. Small to moderate left pleural air  collection does persist. Persistent infiltrate and volume loss in the left lung. Few mild patchy infiltrates in the right lung appear similar. Electronically Signed   By: Paulina Fusi M.D.   On: 04/07/2020 08:09   DG CHEST PORT 1 VIEW  Result Date: 04/06/2020 CLINICAL DATA:  Chest tube status post VATS.  COVID positive. EXAM: PORTABLE CHEST 1 VIEW COMPARISON:  04/05/2020.  CT 04/02/2020. FINDINGS: Left subclavian line and 2 left chest tubes in stable position. Interim slight progression of left-sided pneumothorax. Stable left-sided pleural thickening. Persistent left lung atelectasis/infiltrate. Persistent density noted over the right lower chest. This density was noted to be within the right lung base on prior CT. This could represent a collection of pleural fluid and or atelectasis. Heart size stable. Left chest wall and supraclavicular subcutaneous emphysema again noted. IMPRESSION: 1. Stable positioning of left subclavian line and 2 left chest tubes. Interim slight progression of left-sided pneumothorax. Left chest wall and supraclavicular subcutaneous emphysema again noted without interim change. 2. Persistent left lung atelectasis/infiltrate. Stable left-sided pleural  thickening. 3. Persistent density noted the right lower chest consistent with fluid pseudotumor and or atelectasis. Electronically Signed   By: Maisie Fus  Register   On: 04/06/2020 06:42   DG Chest Port 1 View  Result Date: 04/05/2020 CLINICAL DATA:  Chest tube in place. EXAM: PORTABLE CHEST 1 VIEW COMPARISON:  04/05/2020. FINDINGS: Left IJ line noted with tip over SVC. Interval removal of left pigtail chest tube. Interval placement of 2 large bore chest tubes. Interval near complete resolution of large left pleural effusion. Small pneumothorax noted. Atelectasis/infiltrate left lung. Density noted over the right lung may be overlying the chest. Heart size normal. Left chest wall subcutaneous emphysema. IMPRESSION: 1. Interim removal of  left pigtail chest tube. Interval placement of 2 large bore chest tubes with near complete resolution of large left pleural effusion. Small left pneumothorax noted. 2. Associated atelectasis/infiltrate left lung. These results will be called to the ordering clinician or representative by the Radiologist Assistant, and communication documented in the PACS or [employer]. Electronically Signed   By: Maisie Fus  Register   On: 04/05/2020 16:26   DG Chest Port 1 View  Result Date: 04/05/2020 CLINICAL DATA:  Shortness of breath, COVID-19 positivity EXAM: PORTABLE CHEST 1 VIEW COMPARISON:  04/04/2020 FINDINGS: Cardiac shadow is obscured by the large left empyema. Right lung remains clear. Left hemithorax demonstrates opacification relatively similar to that seen on the prior exam. Pigtail catheter is noted in place. Slight improved aeration in the left upper lobe is noted. No bony abnormality is seen. IMPRESSION: Slight improved aeration on the left. Pigtail catheter remains in place with relatively CT stable left empyema. Electronically Signed   By: Alcide Clever M.D.   On: 04/05/2020 09:04   DG Chest Port 1 View  Result Date: 04/04/2020 CLINICAL DATA:  Shortness of breath.  History of COVID. EXAM: PORTABLE CHEST 1 VIEW COMPARISON:  Chest radiograph 04/02/2020. FINDINGS: Monitoring leads overlie the patient. Cardiac and mediastinal contours largely obscured. Interval insertion left chest tube. Interval decrease in size of left pleural fluid within the upper hemithorax and mid hemithorax. Similar-appearing nodular opacity right mid lung. IMPRESSION: 1. Interval insertion left chest tube with interval decrease in size of left pleural fluid. 2. Similar-appearing nodular opacity right mid lung. Electronically Signed   By: Annia Belt M.D.   On: 04/04/2020 10:46   DG Chest Portable 1 View  Result Date: 04/02/2020 CLINICAL DATA:  Large LEFT pleural effusion EXAM: PORTABLE CHEST 1 VIEW COMPARISON:  Portable exam  at 1203 compared to 0227 hrs FINDINGS: Resolution of previously identified LEFT to RIGHT mediastinal shift. Unable to assess heart size due to subtotal opacification of the LEFT hemithorax. Minimal aeration now seen in LEFT lung. No pneumothorax. Nodular foci at inferior RIGHT lung again seen with minimal RIGHT basilar atelectasis. Osseous structures unremarkable. IMPRESSION: No pneumothorax following LEFT thoracentesis. Resolution of LEFT to RIGHT mediastinal shift. Persistent nodular foci at inferior RIGHT hemithorax. Electronically Signed   By: Ulyses Southward M.D.   On: 04/02/2020 12:32   DG Chest Port 1 View  Result Date: 04/02/2020 CLINICAL DATA:  COVID, shortness of breath EXAM: PORTABLE CHEST 1 VIEW COMPARISON:  None. FINDINGS: Complete whiteout of the left hemithorax, likely related to a combination of effusion and airspace disease. Heart and mediastinal structures are shifted to the right. Right mid lung patchy airspace disease. No effusion on the right. No acute bony abnormality. IMPRESSION: IMPRESSION Complete opacification of the left hemithorax, likely related to effusion and airspace disease. Patchy right mid  lung airspace disease. Electronically Signed   By: Charlett Nose M.D.   On: 04/02/2020 02:48   CT [facility] PLEURAL DRAIN W/INDWELL CATH W/IMG GUIDE  Result Date: 04/03/2020 CLINICAL DATA:  Large left pleural empyema, COVID-19 infection and history of IV drug use. Request for percutaneous thoracostomy tube placement to drain the empyema. EXAM: CT GUIDED CATHETER DRAINAGE OF LEFT PLEURAL EMPYEMA ANESTHESIA/SEDATION: 2.0 mg IV Versed 100 mcg IV Fentanyl Total Moderate Sedation Time:  10 minutes The patient's level of consciousness and physiologic status were continuously monitored during the procedure by Radiology nursing. PROCEDURE: The procedure, risks, benefits, and alternatives were explained to the patient. Questions regarding the procedure were encouraged and answered. The patient understands  and consents to the procedure. A time out was performed prior to initiating the procedure. CT was performed through the chest in a supine position. The left chest wall was prepped with chlorhexidine in a sterile fashion, and a sterile drape was applied covering the operative field. A sterile gown and sterile gloves were used for the procedure. Local anesthesia was provided with 1% Lidocaine. Under CT guidance, an 18 gauge trocar needle was advanced into the left pleural space from an anterolateral approach. After confirming needle tip position, fluid was aspirated. A guidewire was advanced and the needle removed. The percutaneous tract was dilated and a 14 French pigtail drainage catheter advanced into the left pleural space. Catheter position was confirmed by CT. The pigtail catheter was connected to a Pleur-evac device. The catheter was secured at the skin with a Prolene retention suture, StatLock device and dressed with a Vaseline gauze dressing. COMPLICATIONS: None FINDINGS: A large amount of residual left pleural fluid remains despite large volume thoracentesis yesterday. Aspiration from the pleural space yielded grossly purulent fluid. Repeat laboratory analysis was not performed as the fluid removed at the time of thoracentesis yesterday was sent for laboratory testing. After placement of the 14 French drainage catheter, there is good return of purulent fluid. The Pleur-evac will be connected to wall suction at -20 cm of water when the patient returns to her hospital room. IMPRESSION: CT-guided placement of 14 French left chest tube. A 14 French drainage catheter was placed with return of purulent fluid. The drainage catheter was attached to a Pleur-evac device which will be attached to wall suction. Electronically Signed   By: Irish Lack M.D.   On: 04/03/2020 13:33   ECHOCARDIOGRAM LIMITED  Result Date: 04/03/2020    ECHOCARDIOGRAM LIMITED REPORT   Patient Name:   CLARESSA Prasad Date of Exam:  04/03/2020 Medical Rec #:  [id number]     Height:       67.0 in Accession #:    [id number]    Weight:       110.7 lb Date of Birth:  1985/06/21     BSA:          1.573 m Patient Age:    34 years      BP:           109/79 mmHg Patient Gender: F             HR:           99 bpm. Exam Location:  Inpatient Procedure: Limited Echo, Limited Color Doppler and Cardiac Doppler Indications:    endocarditis  History:        Patient has no prior history of Echocardiogram examinations.                 Covid. Pleural  effusion.; Risk Factors:Current Smoker and IV                 drug use.  Sonographer:    Delcie Roch Referring Phys: [id number] COURAGE EMOKPAE  Sonographer Comments: Image acquisition challenging due to respiratory motion. IMPRESSIONS  1. Left ventricular ejection fraction, by estimation, is 60 to 65%. The left ventricle has normal function.  2. Right ventricular systolic function is normal. The right ventricular size is normal.  3. A small pericardial effusion is present. The pericardial effusion is circumferential.  4. The mitral valve is normal in structure. No evidence of mitral valve regurgitation. No evidence of mitral stenosis.  5. The aortic valve was not well visualized. Aortic valve regurgitation is not visualized. No aortic stenosis is present.  6. The inferior vena cava is normal in size with greater than 50% respiratory variability, suggesting right atrial pressure of 3 mmHg. FINDINGS  Left Ventricle: Left ventricular ejection fraction, by estimation, is 60 to 65%. The left ventricle has normal function. Right Ventricle: The right ventricular size is normal. Right vetricular wall thickness was not well visualized. Right ventricular systolic function is normal. Pericardium: A small pericardial effusion is present. The pericardial effusion is circumferential. Mitral Valve: The mitral valve is normal in structure. No evidence of mitral valve stenosis. Tricuspid Valve: The tricuspid valve is normal in  structure. Tricuspid valve regurgitation is not demonstrated. No evidence of tricuspid stenosis. Aortic Valve: The aortic valve was not well visualized. Aortic valve regurgitation is not visualized. No aortic stenosis is present. Pulmonic Valve: The pulmonic valve was normal in structure. Pulmonic valve regurgitation is not visualized. No evidence of pulmonic stenosis. Venous: The inferior vena cava is normal in size with greater than 50% respiratory variability, suggesting right atrial pressure of 3 mmHg. LEFT VENTRICLE PLAX 2D LVIDd:         3.90 cm LVIDs:         2.30 cm LV IVS:        0.70 cm LVOT diam:     1.60 cm LVOT Area:     2.01 cm  IVC IVC diam: 1.90 cm LEFT ATRIUM         Index LA diam:    2.10 cm 1.34 cm/m   AORTA Ao Root diam: 2.20 cm MITRAL VALVE MV Area (PHT): 5.23 cm    SHUNTS MV Decel Time: 145 msec    Systemic Diam: 1.60 cm MV E velocity: 88.70 cm/s MV A velocity: 61.70 cm/s MV E/A ratio:  1.44 Dina Rich MD Electronically signed by Dina Rich MD Signature Date/Time: 04/03/2020/2:31:28 PM    Final    US THORACENTESIS ASP PLEURAL SPACE W/IMG GUIDE  Result Date: 04/02/2020 INDICATION: Large LEFT pleural effusion EXAM: ULTRASOUND GUIDED DIAGNSOTIC AND THERAPEUTIC LEFT THORACENTESIS MEDICATIONS: None. COMPLICATIONS: None immediate. PROCEDURE: Procedure, benefits, and risks of procedure were discussed with patient. Written informed consent for procedure was obtained. Time out protocol followed. Pleural effusion localized by ultrasound at the posterior LEFT hemithorax. Fluid is markedly complex containing diffuse internal echogenicity as well as dependent echogenicity. Skin prepped and draped in usual sterile fashion. Skin and soft tissues anesthetized with 10 mL of 1% lidocaine. 8 French thoracentesis catheter placed into the LEFT pleural space. 1.88 L of complex cloudy tan fluid with particulates with purulent appearance aspirated by syringe pump. Findings most likely represent  empyema. Procedure tolerated well by patient without immediate complication. FINDINGS: Complex purulent appearing LEFT pleural fluid likely representing empyema. IMPRESSION: Successful ultrasound guided LEFT  thoracentesis yielding 1.88 L of pleural fluid. Electronically Signed   By: Ulyses Southward M.D.   On: 04/02/2020 12:39    Lab Data:  CBC: Recent Labs  Lab 04/08/20 0051 04/09/20 0214 04/10/20 0945 04/11/20 0324 04/12/20 0429  WBC 12.4* 9.8 11.9* 10.6* 8.9  HGB 9.4* 9.8* 11.1* 10.2* 10.2*  HCT 27.7* 29.7* 34.3* 31.3* 30.3*  MCV 82.9 84.6 84.9 86.5 84.4  PLT 505* 480* 621* 534* 519*   Basic Metabolic Panel: Recent Labs  Lab 04/07/20 0549 04/08/20 0051 04/09/20 0214 04/10/20 0945 04/11/20 0324 04/12/20 0429  NA 137 136 137 136 138 135  K 3.7 3.2* 4.3 4.8 4.6 4.2  CL 104 108 105 102 101 101  CO2 25 22 25 23 26 26   GLUCOSE 102* 87 116* 72 82 95  BUN 13 9 11 7 9 12   CREATININE 0.60 0.49 0.56 0.52 0.59 0.52  CALCIUM 7.9* 6.7* 8.0* 8.6* 8.6* 8.8*  MG 1.7  --   --   --   --   --    GFR: Estimated Creatinine Clearance: 80 mL/min (by C-G formula based on SCr of 0.52 mg/dL). Liver Function Tests: Recent Labs  Lab 04/08/20 0051 04/09/20 0214 04/10/20 0945 04/11/20 0324 04/12/20 0429  AST 12* 14* 14* 15 13*  ALT 14 14 16 15 14   ALKPHOS 41 49 55 55 52  BILITOT 0.3 0.5 0.4 0.5 0.3  PROT 4.2* 4.8* 6.5 6.0* 6.3*  ALBUMIN 1.4* 1.6* 2.1* 1.9* 2.0*   No results for input(s): LIPASE, AMYLASE in the last 168 hours. No results for input(s): AMMONIA in the last 168 hours. Coagulation Profile: No results for input(s): INR, PROTIME in the last 168 hours. Cardiac Enzymes: No results for input(s): CKTOTAL, CKMB, CKMBINDEX, TROPONINI in the last 168 hours. BNP (last 3 results) No results for input(s): PROBNP in the last 8760 hours. HbA1C: No results for input(s): HGBA1C in the last 72 hours. CBG: Recent Labs  Lab 04/11/20 1206 04/11/20 1733 04/11/20 1937 04/12/20 0734  04/12/20 1149  GLUCAP 102* 94 110* 88 97   Lipid Profile: No results for input(s): CHOL, HDL, LDLCALC, TRIG, CHOLHDL, LDLDIRECT in the last 72 hours. Thyroid Function Tests: No results for input(s): TSH, T4TOTAL, FREET4, T3FREE, THYROIDAB in the last 72 hours. Anemia Panel: No results for input(s): VITAMINB12, FOLATE, FERRITIN, TIBC, IRON, RETICCTPCT in the last 72 hours. Urine analysis: No results found for: COLORURINE, APPEARANCEUR, LABSPEC, PHURINE, GLUCOSEU, HGBUR, BILIRUBINUR, KETONESUR, PROTEINUR, UROBILINOGEN, NITRITE, Arby Barrette Zariana Strub M.D. [facility] 04/12/2020, 12:01 PM   Call night coverage person covering after 7pm

## 2020-04-12 NOTE — Progress Notes (Signed)
   04/12/20 2046  Assess: MEWS Score  Temp 98.1 F (36.7 C)  BP 101/75  ECG Heart Rate (!) 125  Resp 18  Level of Consciousness Alert  SpO2 99 %  O2 Device Room Air  O2 Flow Rate (L/min) 0 L/min  Assess: MEWS Score  MEWS Temp 0  MEWS Systolic 0  MEWS Pulse 2  MEWS RR 0  MEWS LOC 0  MEWS Score 2  MEWS Score Color Yellow  Assess: if the MEWS score is Yellow or Red  Were vital signs taken at a resting state? Yes  Focused Assessment No change from prior assessment  Early Detection of Sepsis Score *See Row Information* Low  MEWS guidelines implemented *See Row Information* Yes  Treat  MEWS Interventions Administered scheduled meds/treatments;Administered prn meds/treatments  Pain Scale 0-10  Pain Score 9  Pain Type Acute pain  Pain Location Chest  Pain Orientation Left  Pain Descriptors / Indicators Sharp;Shooting  Pain Frequency Intermittent  Pain Onset With Activity  Patients Stated Pain Goal 7  Pain Intervention(s) Medication (See eMAR)  Take Vital Signs  Increase Vital Sign Frequency  Yellow: Q 2hr X 2 then Q 4hr X 2, if remains yellow, continue Q 4hrs  Escalate  MEWS: Escalate Yellow: discuss with charge nurse/RN and consider discussing with provider and RRT  Notify: Charge Nurse/RN  Name of Charge Nurse/RN Notified Joni Reining  Date Charge Nurse/RN Notified 04/12/20  Time Charge Nurse/RN Notified 2119  Notify: Provider  Provider Name/Title Zierle-Gosh  Date Provider Notified 04/12/20  Time Provider Notified 2118  Notification Type Page  Notification Reason Other (Comment) (parameters for metop)  Document  Patient Outcome Stabilized after interventions  Progress note created (see row info) Yes

## 2020-04-13 ENCOUNTER — Inpatient Hospital Stay (HOSPITAL_COMMUNITY): Payer: Medicaid Other

## 2020-04-13 LAB — CBC
HCT: 29.2 % — ABNORMAL LOW (ref 36.0–46.0)
Hemoglobin: 9.6 g/dL — ABNORMAL LOW (ref 12.0–15.0)
MCH: 28.4 pg (ref 26.0–34.0)
MCHC: 32.9 g/dL (ref 30.0–36.0)
MCV: 86.4 fL (ref 80.0–100.0)
Platelets: 474 10*3/uL — ABNORMAL HIGH (ref 150–400)
RBC: 3.38 MIL/uL — ABNORMAL LOW (ref 3.87–5.11)
RDW: 15.5 % (ref 11.5–15.5)
WBC: 10.2 10*3/uL (ref 4.0–10.5)
nRBC: 0 % (ref 0.0–0.2)

## 2020-04-13 LAB — COMPREHENSIVE METABOLIC PANEL
ALT: 16 U/L (ref 0–44)
AST: 17 U/L (ref 15–41)
Albumin: 1.8 g/dL — ABNORMAL LOW (ref 3.5–5.0)
Alkaline Phosphatase: 50 U/L (ref 38–126)
Anion gap: 9 (ref 5–15)
BUN: 17 mg/dL (ref 6–20)
CO2: 25 mmol/L (ref 22–32)
Calcium: 8.3 mg/dL — ABNORMAL LOW (ref 8.9–10.3)
Chloride: 105 mmol/L (ref 98–111)
Creatinine, Ser: 0.65 mg/dL (ref 0.44–1.00)
GFR, Estimated: >60 mL/min (ref >60)
Glucose, Bld: 97 mg/dL (ref 70–99)
Potassium: 4.2 mmol/L (ref 3.5–5.1)
Sodium: 139 mmol/L (ref 135–145)
Total Bilirubin: 0.6 mg/dL (ref 0.3–1.2)
Total Protein: 5.8 g/dL — ABNORMAL LOW (ref 6.5–8.1)

## 2020-04-13 LAB — C-REACTIVE PROTEIN: CRP: 1.9 mg/dL — ABNORMAL HIGH (ref <1.0)

## 2020-04-13 LAB — GLUCOSE, CAPILLARY
Glucose-Capillary: 84 mg/dL (ref 70–99)
Glucose-Capillary: 79 mg/dL (ref 70–99)
Glucose-Capillary: 88 mg/dL (ref 70–99)
Glucose-Capillary: 116 mg/dL — ABNORMAL HIGH (ref 70–99)

## 2020-04-13 LAB — PROCALCITONIN: Procalcitonin: <0.1 ng/mL

## 2020-04-13 MED ORDER — ALBUTEROL SULFATE HFA 108 (90 BASE) MCG/ACT IN AERS
2.0000 | INHALATION_SPRAY | Freq: Two times a day (BID) | RESPIRATORY_TRACT | Status: DC
  Administered 2020-04-13 – 2020-04-20 (×15): 2 via RESPIRATORY_TRACT
  Filled 2020-04-13: qty 6.7

## 2020-04-13 NOTE — Progress Notes (Signed)
[facility]                                                                              Patient Demographics  Alyssa Vargas, is a 34 y.o. female, DOB - 05-29-1985, ZOX:[id number]  Admit date - 04/01/2020   Admitting Physician Courage Mariea ClontsEmokpae, MD  Outpatient Primary MD for the patient is Patient, No Pcp Per  Outpatient specialists:   LOS - 11  days   Medical records reviewed and are as summarized below:    Chief Complaint  Patient presents with  . Covid Exposure       Brief summary   34 yo WF with H/o IVDU (iv Heroin and iv Methamphetamine) and Tobacco abuse admitted on 04/02/2020 to [facility] with dyspnea, productive cough, loss of sense of taste and smell, anorexia, generalized aches, pains, malaise fatigue and myalgias and found to be positive for COVID-19 infection with chest x-ray consistent with very very large left-sided pleural effusion with mediastinal shift,-left-sided thoracentesis on 04/02/2020 you did purulent fluid consistent with empyema with Peptococcus pneumonia, she had CT guided chest tube insertion by IR as well, she was seen by CT surgery for which she had VATS with empyema drainage and decortication on 04/05/2020.  Subjective:   Alyssa Vargas was seen and examined today, she denies any fever or chills, reports pain at chest tube insertion site, this has improved immediately when chest tube went from suction waterseal.   Assessment & Plan   Massive left-sided empyema lung with mediastinal shift -Large left-sided empyema, CT surgery consulted -initially  on IV Zosyn and vancomycin, this has been noted to Rocephin once culture were available and showing Streptococcus pneumonia -Status post left-sided thoracentesis on 12/17, 1.88 L purulent appearing fluid removed, CT surgery input greatly appreciated, upon further evaluation decision has been made to proceed with a VATS surgery, . -Status post 14 French drain placement in the left pleural empyema  by IR on 12/18. - VATS with empyema drainage and decortication on 04/05/2020. -Chest tube management/pneumothorax per CT surgery, chest tube with 160 cc output over last 24 hours, remains with small air leak, chest x-ray stable, chest tubes placed to waterseal as increased suction did not help. -She will need 2 weeks of IV antibiotics post her VATS procedure, and for weeks of p.o. antibiotics after that for total of 6 weeks treatment per CT surgery recommendations.  Acute hypoxic respiratory failure  -Multifactorial due to acute COVID-19 viral pneumonia and due to large left-sided empyema -recieved  IV steroids for COVID-19 of pneumonia, has rapidly tapered R hypoxia thought to be mainly due to empyema. -Resolved, on room air -Treated with Remdesivir.  COVID-19 infection -treated  with IV steroids and Remdesivir  Lab Results  Component Value Date   SARSCOV2NAA POSITIVE (A) 04/01/2020     Recent Labs  Lab 04/09/20 0214 04/10/20 0945 04/11/20 0324 04/12/20 0429 04/13/20 0154  CRP  --   --   --   --  1.9*  ALT 14 16 15 14 16   PROCALCITON  --   --   --   --  <0.10    History of IV drug use (  IV heroin, methamphetamine) -Patient reported she last used IV methamphetamine and heroin on Sunday, 03/28/20  Depression, anorexia -Denies suicidal or homicidal ideation or plan -Continue BuSpar   Hyponatremia -Resolved with hydration.  Sinus tachycardia -She is started on low-dose metoprolol, 2D echo with a preserved EF,  circumferential pericardial effusion initial echo has resolved. -   Tobacco use -Nicotine patch placed  Protein calorie malnutrition -Continue with supplements   Code Status: Full CODE STATUS DVT Prophylaxis: Subcu Lovenox Family Communication: Discussed all imaging results, lab results, explained to the patient  Disposition Plan:     Status is: Inpatient  Remains inpatient appropriate because:Inpatient level of care appropriate due to severity of  illness   Dispo: The patient is from: Home              Anticipated d/c is to: Home              Anticipated d/c date is: > 3 days              Patient currently is not medically stable to d/c.        Procedures:  -Status post left-sided thoracentesis on 12/17, 1.88 L purulent appearing fluid removed, -Status post 14 French drain placement in the left pleural empyema by IR on 12/18. - VATS with empyema drainage and decortication on 04/05/2020. Consultants:   PCCM CT surgery Intervention radiology  Antimicrobials:   Anti-infectives (From admission, onward)   Start     Dose/Rate Route Frequency Ordered Stop   04/06/20 1045  cefTRIAXone (ROCEPHIN) 2 g in sodium chloride 0.9 % 100 mL IVPB        2 g 200 mL/hr over 30 Minutes Intravenous Every 24 hours 04/06/20 0945     04/05/20 1100  cefTRIAXone (ROCEPHIN) 2 g in sodium chloride 0.9 % 100 mL IVPB  Status:  Discontinued        2 g 200 mL/hr over 30 Minutes Intravenous Every 24 hours 04/05/20 1023 04/05/20 1453   04/03/20 1600  vancomycin (VANCOREADY) IVPB 750 mg/150 mL  Status:  Discontinued        750 mg 150 mL/hr over 60 Minutes Intravenous Every 12 hours 04/03/20 1500 04/05/20 1020   04/03/20 1000  remdesivir 100 mg in sodium chloride 0.9 % 100 mL IVPB  Status:  Discontinued       "Followed by" Linked Group Details   100 mg 200 mL/hr over 30 Minutes Intravenous Daily 04/02/20 0654 04/02/20 0656   04/03/20 1000  remdesivir 100 mg in sodium chloride 0.9 % 100 mL IVPB       "Followed by" Linked Group Details   100 mg 200 mL/hr over 30 Minutes Intravenous Daily 04/02/20 0658 04/06/20 0948   04/03/20 0400  vancomycin (VANCOREADY) IVPB 500 mg/100 mL  Status:  Discontinued        50 0 mg 100 mL/hr over 60 Minutes Intravenous Every 12 hours 04/02/20 1459 04/03/20 1500   04/02/20 2200  piperacillin-tazobactam (ZOSYN) IVPB 3.375 g  Status:  Discontinued        3.375 g 12.5 mL/hr over 240 Minutes Intravenous Every 8 hours 04/02/20  1332 04/05/20 1023   04/02/20 1515  vancomycin (VANCOREADY) IVPB 1250 mg/250 mL  Status:  Discontinued        1,250 mg 166.7 mL/hr over 90 Minutes Intravenous Every 24 hours 04/02/20 1449 04/02/20 1449   04/02/20 1515  vancomycin (VANCOREADY) IVPB 1250 mg/250 mL        1,250 mg 166.7 mL/hr over  90 Minutes Intravenous  Once 04/02/20 1449 04/02/20 1701   04/02/20 1345  piperacillin-tazobactam (ZOSYN) IVPB 3.375 g        3.375 g 100 mL/hr over 30 Minutes Intravenous  Once 04/02/20 1332 04/02/20 1434   04/02/20 0900  remdesivir 100 mg in sodium chloride 0.9 % 100 mL IVPB       "Followed by" Linked Group Details   100 mg 200 mL/hr over 30 Minutes Intravenous  Once 04/02/20 0658 04/02/20 1251   04/02/20 0800  remdesivir 100 mg in sodium chloride 0.9 % 100 mL IVPB       "Followed by" Linked Group Details   100 mg 200 mL/hr over 30 Minutes Intravenous  Once 04/02/20 0658 04/02/20 0853   04/02/20 0700  remdesivir 200 mg in sodium chloride 0.9% 250 mL IVPB  Status:  Discontinued       "Followed by" Linked Group Details   200 mg 580 mL/hr over 30 Minutes Intravenous Once 04/02/20 0654 04/02/20 0656         Medications  Scheduled Meds: . acidophilus  2 capsule Oral Daily  . albuterol  2 puff Inhalation BID  . bisacodyl  5 mg Oral Daily  . busPIRone  10 mg Oral TID  . Chlorhexidine Gluconate Cloth  6 each Topical Daily  . dextromethorphan-guaiFENesin  1 tablet Oral BID  . enoxaparin (LOVENOX) injection  40 mg Subcutaneous Q24H  . feeding supplement  237 mL Oral TID BM  . fentaNYL (SUBLIMAZE) injection  12.5 mcg Intravenous Once  . insulin aspart  0-5 Units Subcutaneous QHS  . insulin aspart  0-9 Units Subcutaneous TID WC  . lidocaine  1 patch Transdermal Q24H  . metoprolol tartrate  12.5 mg Oral BID  . multivitamin with minerals  1 tablet Oral Daily  . nicotine  14 mg Transdermal Daily  . pantoprazole  40 mg Oral Daily  . senna-docusate  2 tablet Oral BID  . traZODone  100 mg Oral  QHS   Continuous Infusions: . sodium chloride    . sodium chloride 10 mL/hr at 04/07/20 1600  . cefTRIAXone (ROCEPHIN)  IV Stopped (04/13/20 0943)   PRN Meds:.Place/Maintain arterial line **AND** sodium chloride, albuterol, ALPRAZolam, guaiFENesin-dextromethorphan, HYDROmorphone (DILAUDID) injection, hydrOXYzine, ondansetron (ZOFRAN) IV, oxyCODONE, sodium chloride flush, zolpidem       Objective:   Vitals:   04/13/20 0646 04/13/20 0732 04/13/20 1029 04/13/20 1201  BP: 95/66 99/63 93/68  109/68  Pulse: 97 99 99 99  Resp: 15 17 15 20   Temp: 97.8 F (36.6 C) 98.3 F (36.8 C) 98.3 F (36.8 C) 98.3 F (36.8 C)  TempSrc: Oral Oral Oral Oral  SpO2: 96% 96% 94% 96%  Weight:      Height:        Intake/Output Summary (Last 24 hours) at 04/13/2020 1416 Last data filed at 04/13/2020 0640 Gross per 24 hour  Intake 920 ml  Output 160 ml  Net 760 ml     Wt Readings from Last 3 Encounters:  04/02/20 50.2 kg  05/27/19 58.1 kg  05/26/19 58.1 kg     Exam  Awake Alert, Oriented X 3, No new F.N deficits, Normal affect Symmetrical Chest wall movement, Good air movement bilaterally, coarse in the left lung Tachycardic,No Gallops,Rubs or new Murmurs, No Parasternal Heave +ve B.Sounds, Abd Soft, No tenderness, No rebound - guarding or rigidity. No Cyanosis, Clubbing or edema, No new Rash or bruise      Data Reviewed:  I have personally reviewed following  labs and imaging studies  Micro Results No results found for this or any previous visit (from the past 240 hour(s)).  Radiology Reports DG Chest 1 View  Result Date: 04/12/2020 CLINICAL DATA:  Empyema. EXAM: CHEST  1 VIEW COMPARISON:  April 10, 2020 FINDINGS: The cardiomediastinal silhouette is unchanged in contour. Multiple LEFT-sided chest tubes. Unchanged small to moderate LEFT hydropneumothorax with a loculated apical component. Persistent heterogeneous opacification of base and periphery of the LEFT lung. Visualized  abdomen is unremarkable. No acute osseous abnormality. IMPRESSION: Unchanged small to moderate LEFT hydropneumothorax with a loculated apical component. Electronically Signed   By: Meda Klinefelter MD   On: 04/12/2020 07:52   DG Chest 1 View  Result Date: 04/09/2020 CLINICAL DATA:  34 year old female with pneumothorax status post chest tube placement. EXAM: CHEST  1 VIEW COMPARISON:  Chest radiograph dated 04/09/2020 and CT dated 04/02/2020 FINDINGS: Two left sided chest tubes appear in similar position. No significant interval change in the size of the left pneumothorax compared to the prior radiograph. Left subclavian central venous catheter in similar position. Bilobed nodular density in the right mid lung field as seen previously. Stable cardiomediastinal silhouette. No acute osseous pathology. IMPRESSION: No significant interval change in the size of the left pneumothorax compared to the prior radiograph. Two left-sided chest tubes appear in similar position. Electronically Signed   By: Elgie Collard M.D.   On: 04/09/2020 19:10   CT Angio Chest PE W and/or Wo Contrast  Result Date: 04/02/2020 CLINICAL DATA:  COVID exposure, dyspnea, cough, malaise, positive D-dimer EXAM: CT ANGIOGRAPHY CHEST WITH CONTRAST TECHNIQUE: Multidetector CT imaging of the chest was performed using the standard protocol during bolus administration of intravenous contrast. Multiplanar CT image reconstructions and MIPs were obtained to evaluate the vascular anatomy. CONTRAST:  OMNIPAQUE IOHEXOL 350 MG/ML SOLN COMPARISON:  None. FINDINGS: Cardiovascular: There is adequate opacification of the pulmonary arterial tree. There is no intraluminal filling defect identified to suggest acute pulmonary embolism. The central pulmonary arteries are of normal caliber. There is marked mediastinal shift to the right. Global cardiac size within normal limits. No significant coronary artery calcification. No pericardial effusion. The  thoracic aorta is unremarkable. Mediastinum/Nodes: Thyroid unremarkable. Soft tissue within the anterior mediastinum likely represents rebound thymic or residual thymic tissue. The esophagus is unremarkable. No pathologic thoracic adenopathy is identified. Lungs/Pleura: There is a massive left pleural effusion which completely fills the left hemithorax, completely collapses the left lung, and demonstrates marked mass effect upon the mediastinum with marked mediastinal shift to the right. There is interstitial gas within a portion of the left lower lobe likely representing the lateral segment of the left lower lobe which may represent necrosis of the setting of necrotizing pneumonia. Mild patchy ground-glass infiltrate within the a right upper lobe anteriorly is nonspecific, possibly infectious or inflammatory. A bilobed fluid density structure is seen within the right middle lobe measuring 2.3 x 3.6 x 1.6 cm in greatest dimension, nonspecific, but possibly the sequela of prior infection or trauma. Upper Abdomen: No acute abnormality. Musculoskeletal: No acute bone abnormality. Review of the MIP images confirms the above findings. IMPRESSION: No pulmonary embolism. Massive left pleural effusion, possibly representing a a parapneumonic effusion or empyema, demonstrating marked mass effect upon the mediastinum with marked left right mediastinal shift. Extensive interstitial gas within the probable lateral segment of the right lower lobe suggesting parenchymal necrosis in the setting of necrotizing pneumonia. Bilobed fluid-filled structure within the right middle lobe measuring 3.6 cm possibly  representing the sequela of remote trauma or inflammation. Minimal patchy infiltrate within the right upper lobe, likely infectious or inflammatory. Electronically Signed   By: Helyn Numbers MD   On: 04/02/2020 04:18   DG CHEST PORT 1 VIEW  Result Date: 04/13/2020 CLINICAL DATA:  Pneumothorax.  Chest tube.  COVID. EXAM:  PORTABLE CHEST 1 VIEW COMPARISON:  04/12/2020. FINDINGS: Two left chest tubes in stable position. Stable left-sided pneumothorax. Stable atelectatic changes left lung. Stable left-sided pleural thickening. Mild atelectasis/infiltrate right mid lung noted on today's exam. Heart size stable. Thoracic spine scoliosis. No acute bony abnormality. IMPRESSION: 1. Two left chest tubes in stable position. Stable left-sided pneumothorax. Stable atelectatic changes left lung. Stable left-sided pleural thickening. 2. Mild atelectasis/infiltrate right mid lung noted on today's exam. Electronically Signed   By: Maisie Fus  Register   On: 04/13/2020 06:12   DG Chest Port 1 View  Result Date: 04/10/2020 CLINICAL DATA:  Chest tube in place EXAM: PORTABLE CHEST 1 VIEW COMPARISON:  Yesterday FINDINGS: Loculated appearing basal and lateral left pneumothorax, possibly ex vacuo given the degree of pleural disease, unchanged. Chest tubes are in stable position. Normal heart size. Right base nodularity is unchanged. IMPRESSION: Unchanged loculated appearing pneumothorax on the left. Electronically Signed   By: Marnee Spring M.D.   On: 04/10/2020 08:50   DG Chest Port 1 View  Result Date: 04/09/2020 CLINICAL DATA:  Vats/empyema. Left-sided chest tube placement. COVID positive. EXAM: PORTABLE CHEST 1 VIEW COMPARISON:  04/08/2020 FINDINGS: Left subclavian line tip at mid SVC. Mild tracheal deviation to the left. Normal heart size. Two left chest tubes are unchanged in position. Decrease in subcutaneous emphysema about the left chest wall. Moderate left-sided hydropneumothorax is similar. Interstitial thickening throughout the right lung. Persistent right midlung nodular opacity. IMPRESSION: Relatively similar appearance of the chest since 1 day prior. Left-sided hydropneumothorax with 2 chest tubes in place. Minimal decrease in subcutaneous emphysema. Right midlung nodular opacity, similar. Electronically Signed   By: Jeronimo Greaves  M.D.   On: 04/09/2020 13:07   DG CHEST PORT 1 VIEW  Result Date: 04/08/2020 CLINICAL DATA:  Follow-up chest tube EXAM: PORTABLE CHEST 1 VIEW COMPARISON:  Radiograph 04/05/2020 FINDINGS: *Left subclavian approach central venous catheter tip terminates at the left brachiocephalic-caval confluence. *There are 2 left apically directed chest tubes which remain in place. *Telemetry leads and external support devices overlie the chest. There is a persistent complex left hydropneumothorax, not significantly changed in size from prior with persistent apical capping and lateral basal air lucency. Some more focal opacity along the pleural surfaces, could reflect a developing pleural rind with more coalescent opacity in the left lung base though only partially re-expanded. Redemonstrated bilobed nodular opacity present in the right lung base as well with additional streaky opacities favoring residual atelectasis or edema. Cardiomediastinal contours are unchanged. Extensive subcutaneous emphysema across the left chest wall and base of the left neck. IMPRESSION: 1. Stable complex left hydropneumothorax with 2 chest tubes in place. Overall size is unchanged from prior. 2. Some more focal opacity along the pleural surfaces, could reflect a developing pleural rind with more coalescent opacity in the left lung base though only partially re-expanded. 3. Extensive subcutaneous emphysema across the left chest wall and base of the left neck. 4. Stable bilobed nodular opacity in the right middle lobe. Some additional atelectatic changes and/or edema bilaterally. Electronically Signed   By: Kreg Shropshire M.D.   On: 04/08/2020 22:04   DG CHEST PORT 1 VIEW  Result  Date: 04/08/2020 CLINICAL DATA:  Pneumothorax. EXAM: PORTABLE CHEST 1 VIEW COMPARISON:  04/07/2020 and prior. FINDINGS: Lateral left basilar/sub pulmonic pneumothorax is unchanged. Apically terminating dual left chest tubes. Left subclavian CVC is unchanged with tip  overlying the azygos. Left lung volume loss and basilar opacities, similar prior exam. Clear right lung. IMPRESSION: Lateral left basilar/subpulmonic pneumothorax is unchanged. Indwelling apically terminating left chest tubes. Electronically Signed   By: Stana Bunting M.D.   On: 04/08/2020 09:06   DG Chest Port 1 View  Result Date: 04/07/2020 CLINICAL DATA:  Coronavirus infection.  Chest tube. EXAM: PORTABLE CHEST 1 VIEW COMPARISON:  04/06/2020 FINDINGS: Left subclavian central line tip the level of the azygos vein as seen previously. Two left chest tubes remain in place. Less pleural air than was seen yesterday. Small to moderate left pleural air collection does persist. Persistent infiltrate and volume loss in the left lung. Small amount of pleural fluid on the left. Few mild patchy infiltrates in the right lung appear similar. IMPRESSION: Less pleural air on the left than was seen yesterday. Small to moderate left pleural air collection does persist. Persistent infiltrate and volume loss in the left lung. Few mild patchy infiltrates in the right lung appear similar. Electronically Signed   By: Paulina Fusi M.D.   On: 04/07/2020 08:09   DG CHEST PORT 1 VIEW  Result Date: 04/06/2020 CLINICAL DATA:  Chest tube status post VATS.  COVID positive. EXAM: PORTABLE CHEST 1 VIEW COMPARISON:  04/05/2020.  CT 04/02/2020. FINDINGS: Left subclavian line and 2 left chest tubes in stable position. Interim slight progression of left-sided pneumothorax. Stable left-sided pleural thickening. Persistent left lung atelectasis/infiltrate. Persistent density noted over the right lower chest. This density was noted to be within the right lung base on prior CT. This could represent a collection of pleural fluid and or atelectasis. Heart size stable. Left chest wall and supraclavicular subcutaneous emphysema again noted. IMPRESSION: 1. Stable positioning of left subclavian line and 2 left chest tubes. Interim slight  progression of left-sided pneumothorax. Left chest wall and supraclavicular subcutaneous emphysema again noted without interim change. 2. Persistent left lung atelectasis/infiltrate. Stable left-sided pleural thickening. 3. Persistent density noted the right lower chest consistent with fluid pseudotumor and or atelectasis. Electronically Signed   By: Maisie Fus  Register   On: 04/06/2020 06:42   DG Chest Port 1 View  Result Date: 04/05/2020 CLINICAL DATA:  Chest tube in place. EXAM: PORTABLE CHEST 1 VIEW COMPARISON:  04/05/2020. FINDINGS: Left IJ line noted with tip over SVC. Interval removal of left pigtail chest tube. Interval placement of 2 large bore chest tubes. Interval near complete resolution of large left pleural effusion. Small pneumothorax noted. Atelectasis/infiltrate left lung. Density noted over the right lung may be overlying the chest. Heart size normal. Left chest wall subcutaneous emphysema. IMPRESSION: 1. Interim removal of left pigtail chest tube. Interval placement of 2 large bore chest tubes with near complete resolution of large left pleural effusion. Small left pneumothorax noted. 2. Associated atelectasis/infiltrate left lung. These results will be called to the ordering clinician or representative by the Radiologist Assistant, and communication documented in the PACS or [employer]. Electronically Signed   By: Maisie Fus  Register   On: 04/05/2020 16:26   DG Chest Port 1 View  Result Date: 04/05/2020 CLINICAL DATA:  Shortness of breath, COVID-19 positivity EXAM: PORTABLE CHEST 1 VIEW COMPARISON:  04/04/2020 FINDINGS: Cardiac shadow is obscured by the large left empyema. Right lung remains clear. Left hemithorax demonstrates opacification  relatively similar to that seen on the prior exam. Pigtail catheter is noted in place. Slight improved aeration in the left upper lobe is noted. No bony abnormality is seen. IMPRESSION: Slight improved aeration on the left. Pigtail catheter remains  in place with relatively CT stable left empyema. Electronically Signed   By: Alcide Clever M.D.   On: 04/05/2020 09:04   DG Chest Port 1 View  Result Date: 04/04/2020 CLINICAL DATA:  Shortness of breath.  History of COVID. EXAM: PORTABLE CHEST 1 VIEW COMPARISON:  Chest radiograph 04/02/2020. FINDINGS: Monitoring leads overlie the patient. Cardiac and mediastinal contours largely obscured. Interval insertion left chest tube. Interval decrease in size of left pleural fluid within the upper hemithorax and mid hemithorax. Similar-appearing nodular opacity right mid lung. IMPRESSION: 1. Interval insertion left chest tube with interval decrease in size of left pleural fluid. 2. Similar-appearing nodular opacity right mid lung. Electronically Signed   By: Annia Belt M.D.   On: 04/04/2020 10:46   DG Chest Portable 1 View  Result Date: 04/02/2020 CLINICAL DATA:  Large LEFT pleural effusion EXAM: PORTABLE CHEST 1 VIEW COMPARISON:  Portable exam at 1203 compared to 0227 hrs FINDINGS: Resolution of previously identified LEFT to RIGHT mediastinal shift. Unable to assess heart size due to subtotal opacification of the LEFT hemithorax. Minimal aeration now seen in LEFT lung. No pneumothorax. Nodular foci at inferior RIGHT lung again seen with minimal RIGHT basilar atelectasis. Osseous structures unremarkable. IMPRESSION: No pneumothorax following LEFT thoracentesis. Resolution of LEFT to RIGHT mediastinal shift. Persistent nodular foci at inferior RIGHT hemithorax. Electronically Signed   By: Ulyses Southward M.D.   On: 04/02/2020 12:32   DG Chest Port 1 View  Result Date: 04/02/2020 CLINICAL DATA:  COVID, shortness of breath EXAM: PORTABLE CHEST 1 VIEW COMPARISON:  None. FINDINGS: Complete whiteout of the left hemithorax, likely related to a combination of effusion and airspace disease. Heart and mediastinal structures are shifted to the right. Right mid lung patchy airspace disease. No effusion on the right. No acute  bony abnormality. IMPRESSION: IMPRESSION Complete opacification of the left hemithorax, likely related to effusion and airspace disease. Patchy right mid lung airspace disease. Electronically Signed   By: Charlett Nose M.D.   On: 04/02/2020 02:48   CT [facility] PLEURAL DRAIN W/INDWELL CATH W/IMG GUIDE  Result Date: 04/03/2020 CLINICAL DATA:  Large left pleural empyema, COVID-19 infection and history of IV drug use. Request for percutaneous thoracostomy tube placement to drain the empyema. EXAM: CT GUIDED CATHETER DRAINAGE OF LEFT PLEURAL EMPYEMA ANESTHESIA/SEDATION: 2.0 mg IV Versed 100 mcg IV Fentanyl Total Moderate Sedation Time:  10 minutes The patient's level of consciousness and physiologic status were continuously monitored during the procedure by Radiology nursing. PROCEDURE: The procedure, risks, benefits, and alternatives were explained to the patient. Questions regarding the procedure were encouraged and answered. The patient understands and consents to the procedure. A time out was performed prior to initiating the procedure. CT was performed through the chest in a supine position. The left chest wall was prepped with chlorhexidine in a sterile fashion, and a sterile drape was applied covering the operative field. A sterile gown and sterile gloves were used for the procedure. Local anesthesia was provided with 1% Lidocaine. Under CT guidance, an 18 gauge trocar needle was advanced into the left pleural space from an anterolateral approach. After confirming needle tip position, fluid was aspirated. A guidewire was advanced and the needle removed. The percutaneous tract was dilated and a 14 [place]  pigtail drainage catheter advanced into the left pleural space. Catheter position was confirmed by CT. The pigtail catheter was connected to a Pleur-evac device. The catheter was secured at the skin with a Prolene retention suture, StatLock device and dressed with a Vaseline gauze dressing. COMPLICATIONS: None  FINDINGS: A large amount of residual left pleural fluid remains despite large volume thoracentesis yesterday. Aspiration from the pleural space yielded grossly purulent fluid. Repeat laboratory analysis was not performed as the fluid removed at the time of thoracentesis yesterday was sent for laboratory testing. After placement of the 14 French drainage catheter, there is good return of purulent fluid. The Pleur-evac will be connected to wall suction at -20 cm of water when the patient returns to her hospital room. IMPRESSION: CT-guided placement of 14 French left chest tube. A 14 French drainage catheter was placed with return of purulent fluid. The drainage catheter was attached to a Pleur-evac device which will be attached to wall suction. Electronically Signed   By: Irish Lack M.D.   On: 04/03/2020 13:33   ECHOCARDIOGRAM LIMITED  Result Date: 04/12/2020    ECHOCARDIOGRAM LIMITED REPORT   Patient Name:   XIOMARA Stifter Date of Exam: 04/12/2020 Medical Rec #:  [id number]     Height:       67.0 in Accession #:    [id number]    Weight:       110.7 lb Date of Birth:  [birth year]     BSA:          1.573 m Patient Age:    34 years      BP:           94/63 mmHg Patient Gender: F             HR:           106 bpm. Exam Location:  Inpatient Procedure: Limited Echo, Cardiac Doppler and Color Doppler Indications:    I31.3 Pericardial effusion (noninflammatory)  History:        Patient has prior history of Echocardiogram examinations, most                 recent 04/03/2020. Abnormal ECG; Risk Factors:Current Smoker.                 Covid positive. IVDU.  Sonographer:    Sheralyn Boatman RDCS Referring Phys: [id number] Giankarlo Leamer S [facility]  Sonographer Comments: Technically difficult study due to poor echo windows. Chest tube in apical region. IMPRESSIONS  1. Left ventricular ejection fraction, by estimation, is 65 to 70%. The left ventricle has normal function. The left ventricle has no regional wall motion abnormalities. There is  mild concentric left ventricular hypertrophy. Left ventricular diastolic function could not be evaluated.  2. Right ventricular systolic function is normal. The right ventricular size is mildly enlarged. There is normal pulmonary artery systolic pressure.  3. No evidence of mitral valve regurgitation.  4. Tricuspid valve thickening noted with eccentric regurgitation.  5. The aortic valve is tricuspid. Aortic valve regurgitation is not visualized.  6. The inferior vena cava is normal in size with greater than 50% respiratory variability, suggesting right atrial pressure of 3 mmHg. Comparison(s): A prior study was performed on 04/03/20. Difficult comparison from prior: Patient was not able to tolerate full study 04/03/20. In this study, tricuspid regurgitation is more prominent with tricuspid valve thickening and no gross vegetation. Pericardial effusion has resolved. FINDINGS  Left Ventricle: Left ventricular ejection fraction, by estimation, is 65 to 70%. The  left ventricle has normal function. The left ventricle has no regional wall motion abnormalities. The left ventricular internal cavity size was normal in size. There is  mild concentric left ventricular hypertrophy. Left ventricular diastolic function could not be evaluated. Right Ventricle: The right ventricular size is mildly enlarged. Right ventricular systolic function is normal. There is normal pulmonary artery systolic pressure. The tricuspid regurgitant velocity is 2.16 m/s, and with an assumed right atrial pressure of 3 mmHg, the estimated right ventricular systolic pressure is 21.7 mmHg. Left Atrium: Left atrial size was normal in size. Pericardium: There is no evidence of pericardial effusion. Tricuspid Valve: Tricuspid valve thickening noted with eccentric regurgitation. Aortic Valve: The aortic valve is tricuspid. Aortic valve regurgitation is not visualized. Aorta: The aortic root and ascending aorta are structurally normal, with no evidence of  dilitation. Venous: The inferior vena cava is normal in size with greater than 50% respiratory variability, suggesting right atrial pressure of 3 mmHg. LEFT VENTRICLE PLAX 2D LVIDd:         4.10 cm LVIDs:         3.00 cm LV PW:         1.20 cm LV IVS:        1.00 cm  IVC IVC diam: 1.30 cm LEFT ATRIUM         Index LA diam:    2.40 cm 1.53 cm/m   AORTA Ao Root diam: 2.80 cm Ao Asc diam:  2.20 cm TRICUSPID VALVE TR Peak grad:   18.7 mmHg TR Vmax:        216.00 cm/s Riley Lam MD Electronically signed by Riley Lam MD Signature Date/Time: 04/12/2020/5:33:47 PM    Final    ECHOCARDIOGRAM LIMITED  Result Date: 04/03/2020    ECHOCARDIOGRAM LIMITED REPORT   Patient Name:   KELTY Ballinas Date of Exam: 04/03/2020 Medical Rec #:  [id number]     Height:       67.0 in Accession #:    [id number]    Weight:       110.7 lb Date of Birth:  1985-10-13     BSA:          1.573 m Patient Age:    34 years      BP:           109/79 mmHg Patient Gender: F             HR:           99 bpm. Exam Location:  Inpatient Procedure: Limited Echo, Limited Color Doppler and Cardiac Doppler Indications:    endocarditis  History:        Patient has no prior history of Echocardiogram examinations.                 Covid. Pleural effusion.; Risk Factors:Current Smoker and IV                 drug use.  Sonographer:    Delcie Roch Referring Phys: [id number] COURAGE EMOKPAE  Sonographer Comments: Image acquisition challenging due to respiratory motion. IMPRESSIONS  1. Left ventricular ejection fraction, by estimation, is 60 to 65%. The left ventricle has normal function.  2. Right ventricular systolic function is normal. The right ventricular size is normal.  3. A small pericardial effusion is present. The pericardial effusion is circumferential.  4. The mitral valve is normal in structure. No evidence of mitral valve regurgitation. No evidence of mitral stenosis.  5. The aortic valve was not  well visualized. Aortic valve  regurgitation is not visualized. No aortic stenosis is present.  6. The inferior vena cava is normal in size with greater than 50% respiratory variability, suggesting right atrial pressure of 3 mmHg. FINDINGS  Left Ventricle: Left ventricular ejection fraction, by estimation, is 60 to 65%. The left ventricle has normal function. Right Ventricle: The right ventricular size is normal. Right vetricular wall thickness was not well visualized. Right ventricular systolic function is normal. Pericardium: A small pericardial effusion is present. The pericardial effusion is circumferential. Mitral Valve: The mitral valve is normal in structure. No evidence of mitral valve stenosis. Tricuspid Valve: The tricuspid valve is normal in structure. Tricuspid valve regurgitation is not demonstrated. No evidence of tricuspid stenosis. Aortic Valve: The aortic valve was not well visualized. Aortic valve regurgitation is not visualized. No aortic stenosis is present. Pulmonic Valve: The pulmonic valve was normal in structure. Pulmonic valve regurgitation is not visualized. No evidence of pulmonic stenosis. Venous: The inferior vena cava is normal in size with greater than 50% respiratory variability, suggesting right atrial pressure of 3 mmHg. LEFT VENTRICLE PLAX 2D LVIDd:         3.90 cm LVIDs:         2.30 cm LV IVS:        0.70 cm LVOT diam:     1.60 cm LVOT Area:     2.01 cm  IVC IVC diam: 1.90 cm LEFT ATRIUM         Index LA diam:    2.10 cm 1.34 cm/m   AORTA Ao Root diam: 2.20 cm MITRAL VALVE MV Area (PHT): 5.23 cm    SHUNTS MV Decel Time: 145 msec    Systemic Diam: 1.60 cm MV E velocity: 88.70 cm/s MV A velocity: 61.70 cm/s MV E/A ratio:  1.44 Dina Rich MD Electronically signed by Dina Rich MD Signature Date/Time: 04/03/2020/2:31:28 PM    Final    US THORACENTESIS ASP PLEURAL SPACE W/IMG GUIDE  Result Date: 04/02/2020 INDICATION: Large LEFT pleural effusion EXAM: ULTRASOUND GUIDED DIAGNSOTIC AND THERAPEUTIC  LEFT THORACENTESIS MEDICATIONS: None. COMPLICATIONS: None immediate. PROCEDURE: Procedure, benefits, and risks of procedure were discussed with patient. Written informed consent for procedure was obtained. Time out protocol followed. Pleural effusion localized by ultrasound at the posterior LEFT hemithorax. Fluid is markedly complex containing diffuse internal echogenicity as well as dependent echogenicity. Skin prepped and draped in usual sterile fashion. Skin and soft tissues anesthetized with 10 mL of 1% lidocaine. 8 French thoracentesis catheter placed into the LEFT pleural space. 1.88 L of complex cloudy tan fluid with particulates with purulent appearance aspirated by syringe pump. Findings most likely represent empyema. Procedure tolerated well by patient without immediate complication. FINDINGS: Complex purulent appearing LEFT pleural fluid likely representing empyema. IMPRESSION: Successful ultrasound guided LEFT thoracentesis yielding 1.88 L of pleural fluid. Electronically Signed   By: Ulyses Southward M.D.   On: 04/02/2020 12:39    Lab Data:  CBC: Recent Labs  Lab 04/09/20 0214 04/10/20 0945 04/11/20 0324 04/12/20 0429 04/13/20 0154  WBC 9.8 11.9* 10.6* 8.9 10.2  HGB 9.8* 11.1* 10.2* 10.2* 9.6*  HCT 29.7* 34.3* 31.3* 30.3* 29.2*  MCV 84.6 84.9 86.5 84.4 86.4  PLT 480* 621* 534* 519* 474*   Basic Metabolic Panel: Recent Labs  Lab 04/07/20 0549 04/08/20 0051 04/09/20 0214 04/10/20 0945 04/11/20 0324 04/12/20 0429 04/13/20 0154  NA 137   < > 137 136 138 135 139  K 3.7   < >  4.3 4.8 4.6 4.2 4.2  CL 104   < > 105 102 101 101 105  CO2 25   < > 25 23 26 26 25   GLUCOSE 102*   < > 116* 72 82 95 97  BUN 13   < > 11 7 9 12 17   CREATININE 0.60   < > 0.56 0.52 0.59 0.52 0.65  CALCIUM 7.9*   < > 8.0* 8.6* 8.6* 8.8* 8.3*  MG 1.7  --   --   --   --   --   --    < > = values in this interval not displayed.   GFR: Estimated Creatinine Clearance: 80 mL/min (by C-G formula based on SCr of  0.65 mg/dL). Liver Function Tests: Recent Labs  Lab 04/09/20 0214 04/10/20 0945 04/11/20 0324 04/12/20 0429 04/13/20 0154  AST 14* 14* 15 13* 17  ALT 14 16 15 14 16   ALKPHOS 49 55 55 52 50  BILITOT 0.5 0.4 0.5 0.3 0.6  PROT 4.8* 6.5 6.0* 6.3* 5.8*  ALBUMIN 1.6* 2.1* 1.9* 2.0* 1.8*   No results for input(s): LIPASE, AMYLASE in the last 168 hours. No results for input(s): AMMONIA in the last 168 hours. Coagulation Profile: No results for input(s): INR, PROTIME in the last 168 hours. Cardiac Enzymes: No results for input(s): CKTOTAL, CKMB, CKMBINDEX, TROPONINI in the last 168 hours. BNP (last 3 results) No results for input(s): PROBNP in the last 8760 hours. HbA1C: No results for input(s): HGBA1C in the last 72 hours. CBG: Recent Labs  Lab 04/12/20 1149 04/12/20 1717 04/12/20 2144 04/13/20 0736 04/13/20 1206  GLUCAP 97 117* 111* 84 79   Lipid Profile: No results for input(s): CHOL, HDL, LDLCALC, TRIG, CHOLHDL, LDLDIRECT in the last 72 hours. Thyroid Function Tests: No results for input(s): TSH, T4TOTAL, FREET4, T3FREE, THYROIDAB in the last 72 hours. Anemia Panel: No results for input(s): VITAMINB12, FOLATE, FERRITIN, TIBC, IRON, RETICCTPCT in the last 72 hours. Urine analysis: No results found for: COLORURINE, APPEARANCEUR, LABSPEC, PHURINE, GLUCOSEU, HGBUR, BILIRUBINUR, KETONESUR, PROTEINUR, UROBILINOGEN, NITRITE, 04/14/20 Manasa Spease M.D. [facility] 04/13/2020, 2:16 PM   Call night coverage person covering after 7pm

## 2020-04-13 NOTE — Progress Notes (Signed)
Nutrition Follow-up  DOCUMENTATION CODES:   Severe malnutrition in context of acute illness/injury,Underweight  INTERVENTION:  Continue Ensure Enlive po TID, each supplement provides 350 kcal and 20 grams of protein.  Continue Magic cup TID with meals, each supplement provides 290 kcal and 9 grams of protein  Encourage adequate PO intake.   NUTRITION DIAGNOSIS:   Severe Malnutrition related to acute illness (COVID 19 infection) as evidenced by moderate fat depletion,severe muscle depletion,energy intake < or equal to 50% for > or equal to 5 days; ongoing  GOAL:   Patient will meet greater than or equal to 90% of their needs; progressing  MONITOR:   Supplement acceptance,Weight trends,PO intake,Labs,I & O's  REASON FOR ASSESSMENT:   Malnutrition Screening Tool    ASSESSMENT:   Alyssa Vargas is a 34 y.o. female with medical history significant for tobacco abuse who presents to the emergency department due to 1 week of worsening shortness of breath associated with cough, loss of sense of taste with decreased appetite and generalized body aches. Patient states that she was exposed to someone with Covid, she complained of chills and sweats, but was unsure if she had fever. She states that she has lost about 18 pounds within 1 month and states that this was due to being depressed prior to onset of current symptoms. Last smoking of cigarettes was yesterday. She denies any thoughts to harm herself or anyone.  12/17- s/p L thoracentesis (1.88 L fluid removed) 12/18- s/p L chest tube placement  12/20- s/p VATS, empyema drainage, decortication  Chest tube output 160 ml. PO intake has improved. Meal completion has been 100% recently. Pt currently has Ensure ordered and has been consuming them. RD to continue with nutritional supplementation to aid in caloric and protein needs. Will additionally continue Magic cup supplements with meals. Pt encouraged to eat her food at meals and to drink  her supplements.   Labs and medications reviewed.   Diet Order:   Diet Order            Diet regular Room service appropriate? Yes; Fluid consistency: Thin  Diet effective now                 EDUCATION NEEDS:   Education needs have been addressed  Skin:  Skin Assessment: Reviewed RN Assessment Skin Integrity Issues:: Incisions Incisions: L chest  Last BM:  12/27  Height:   Ht Readings from Last 1 Encounters:  04/02/20 5\' 7"  (1.702 m)    Weight:   Wt Readings from Last 1 Encounters:  04/02/20 50.2 kg    Ideal Body Weight:  61.4 kg  BMI:  Body mass index is 17.33 kg/m.  Estimated Nutritional Needs:   Kcal:  2000-2200  Protein:  110-125 grams  Fluid:  > 2 L  04/04/20, MS, RD, LDN RD pager number/after hours weekend pager number on Amion.

## 2020-04-13 NOTE — Progress Notes (Signed)
   04/12/20 2245  Assess: MEWS Score  Temp 98.3 F (36.8 C)  BP 97/63  Pulse Rate (!) 130  ECG Heart Rate (!) 133  Resp 20  Level of Consciousness Alert  SpO2 96 %  O2 Device Room Air  Patient Activity (if Appropriate) In chair  O2 Flow Rate (L/min) 0 L/min  Assess: MEWS Score  MEWS Temp 0  MEWS Systolic 1  MEWS Pulse 3  MEWS RR 0  MEWS LOC 0  MEWS Score 4  MEWS Score Color Red  Assess: if the MEWS score is Yellow or Red  Were vital signs taken at a resting state? Yes  Focused Assessment No change from prior assessment  Early Detection of Sepsis Score *See Row Information* Low  MEWS guidelines implemented *See Row Information* Yes  Treat  MEWS Interventions Escalated (See documentation below)  Take Vital Signs  Increase Vital Sign Frequency  Red: Q 1hr X 4 then Q 4hr X 4, if remains red, continue Q 4hrs  Escalate  MEWS: Escalate Red: discuss with charge nurse/RN and provider, consider discussing with RRT  Notify: Charge Nurse/RN  Name of Charge Nurse/RN Notified Joni Reining  Date Charge Nurse/RN Notified 04/12/20  Time Charge Nurse/RN Notified 2250  Notify: Provider  Provider Name/Title Zierle-Ghosh  Date Provider Notified 04/12/20  Time Provider Notified 2253  Notification Type Page  Notification Reason Change in status  Document  Patient Outcome Stabilized after interventions  Progress note created (see row info) Yes

## 2020-04-13 NOTE — Progress Notes (Signed)
   04/13/20 1633  Assess: MEWS Score  Temp 99.1 F (37.3 C)  BP 104/77  Pulse Rate (!) 123  ECG Heart Rate (!) 132  Resp (!) 27  Level of Consciousness Alert  SpO2 94 %  O2 Device Room Air  Assess: MEWS Score  MEWS Temp 0  MEWS Systolic 0  MEWS Pulse 3  MEWS RR 2  MEWS LOC 0  MEWS Score 5  MEWS Score Color Red  Assess: if the MEWS score is Yellow or Red  Were vital signs taken at a resting state? Yes  Focused Assessment No change from prior assessment  Early Detection of Sepsis Score *See Row Information* Low  MEWS guidelines implemented *See Row Information* No, vital signs rechecked  Treat  MEWS Interventions Administered scheduled meds/treatments  Pain Scale 0-10  Pain Score 4  Take Vital Signs  Increase Vital Sign Frequency  Red: Q 1hr X 4 then Q 4hr X 4, if remains red, continue Q 4hrs  Escalate  MEWS: Escalate Red: discuss with charge nurse/RN and provider, consider discussing with RRT  Notify: Charge Nurse/RN  Name of Charge Nurse/RN Notified Erin  Date Charge Nurse/RN Notified 04/13/20  Time Charge Nurse/RN Notified 1633

## 2020-04-13 NOTE — Progress Notes (Addendum)
      [address]       Alyssa Vargas [address]             [phone]       8 Days Post-Op Procedure(s) (LRB): VIDEO ASSISTED THORACOSCOPY (VATS)/EMPYEMA DRAINAGE, DECORTICATION  (Left)  Subjective: Up in the bedside chair. Says she has had more pain in the left chest since yesterday. No change in respiratory effort.   Objective: Vital signs in last 24 hours: Temp:  [97.8 F (36.6 C)-99 F (37.2 C)] 98.3 F (36.8 C) (12/28 0732) Pulse Rate:  [95-130] 99 (12/28 0732) Cardiac Rhythm: Normal sinus rhythm (12/28 0730) Resp:  [15-20] 17 (12/28 0732) BP: (90-107)/(58-83) 99/63 (12/28 0732) SpO2:  [94 %-100 %] 96 % (12/28 0732)  Intake/Output from previous day: 12/27 0701 - 12/28 0700 In: 1160 [P.O.:960; IV Piggyback:200] Out: 160 [Chest Tube:160]  Physical Exam:  Cardiovascular: mild tachycardia, regular rhythm Pulmonary: Clear to auscultation on the right;audible rub/squeak on left (chest tubes) unchanged. Extremities:No lower extremity edema. Wounds: Dressing is wound lean and dry.   Chest Tubes:to - 40cmH2O suction,  small air leak with talking and coughing   Lab Results: CBC: Recent Labs    04/12/20 0429 04/13/20 0154  WBC 8.9 10.2  HGB 10.2* 9.6*  HCT 30.3* 29.2*  PLT 519* 474*   BMET:  Recent Labs    04/12/20 0429 04/13/20 0154  NA 135 139  K 4.2 4.2  CL 101 105  CO2 26 25  GLUCOSE 95 97  BUN 12 17  CREATININE 0.52 0.65  CALCIUM 8.8* 8.3*    PT/INR: No results for input(s): LABPROT, INR in the last 72 hours. ABG:  INR: Will add last result for INR, ABG once components are confirmed Will add last 4 CBG results once components are confirmed  Assessment/Plan:  1. CV - SR/ST with HR into the 130's early this morning. On Lopressor 12.5 mg bid. Per primary 2.  Pulmonary - On room air. Chest tube with 160 cc last 24 hours. Chest tube has a small air leak that worsens with cough. CXR appears stable (stable small to moderate left  hydropneumothorax, loculated) and is unchanged with increase in the suction yesterday. Chest tubes placed to water seal since the increased suction did not improve lung expansion. Chest pain improved immediately when the suction was removed.   Encourage incentive spirometer. 3. CBGs 94/110/88. On Insulin PRN 4. Anemia-H and H stable 5. ID-on Ceftriaxone 2 grams IV daily. Dr. Dorris Fetch recommends at least 6 weeks total antibiotics- likely 2 weeks IV then 4 Po  Myron G. RoddenberryPA-C 04/13/2020,9:15 AM [phone]  Reviewed Echo-  Normal LV function. New TR- eccentric appears relatively mild with thickening of TV but no definite vegetation. RA pressure estimated at 3 mm Hg.  Would consult Cardiology and ID electively for their input on possible endocarditis.   Salvatore Decent Dorris Fetch, MD [facility] [phone]

## 2020-04-14 ENCOUNTER — Inpatient Hospital Stay (HOSPITAL_COMMUNITY): Payer: Medicaid Other

## 2020-04-14 DIAGNOSIS — U071 COVID-19: Secondary | ICD-10-CM | POA: Diagnosis not present

## 2020-04-14 DIAGNOSIS — F1990 Other psychoactive substance use, unspecified, uncomplicated: Secondary | ICD-10-CM | POA: Diagnosis not present

## 2020-04-14 DIAGNOSIS — J869 Pyothorax without fistula: Secondary | ICD-10-CM | POA: Diagnosis not present

## 2020-04-14 LAB — GLUCOSE, CAPILLARY
Glucose-Capillary: 102 mg/dL — ABNORMAL HIGH (ref 70–99)
Glucose-Capillary: 100 mg/dL — ABNORMAL HIGH (ref 70–99)
Glucose-Capillary: 81 mg/dL (ref 70–99)
Glucose-Capillary: 117 mg/dL — ABNORMAL HIGH (ref 70–99)

## 2020-04-14 MED ORDER — CEFDINIR 300 MG PO CAPS
600.0000 mg | ORAL_CAPSULE | Freq: Every day | ORAL | Status: DC
  Administered 2020-04-16 – 2020-04-20 (×5): 600 mg via ORAL
  Filled 2020-04-14 (×6): qty 2

## 2020-04-14 NOTE — Progress Notes (Addendum)
      [address]       [place] [address]             [phone]       9 Days Post-Op Procedure(s) (LRB): VIDEO ASSISTED THORACOSCOPY (VATS)/EMPYEMA DRAINAGE, DECORTICATION  (Left)  Subjective: Patient continues with pain at chest tube sites  Objective: Vital signs in last 24 hours: Temp:  [98 F (36.7 C)-99.4 F (37.4 C)] 98.5 F (36.9 C) (12/29 0517) Pulse Rate:  [94-123] 111 (12/29 0517) Cardiac Rhythm: Sinus tachycardia (12/29 0138) Resp:  [15-31] 18 (12/29 0517) BP: (90-112)/(56-77) 90/63 (12/29 0517) SpO2:  [86 %-100 %] 97 % (12/29 0517)      Intake/Output from previous day: 12/28 0701 - 12/29 0700 In: 240 [P.O.:240] Out: 30 [Chest Tube:30]   Physical Exam:  Cardiovascular: RRR Pulmonary: Clear to auscultation on the right;diminshed left base. Extremities:No lower extremity edema. Wounds: Dressing is wound lean and dry.   Chest Tubes:to water seal, small air leak with cough  Lab Results: CBC: Recent Labs    04/12/20 0429 04/13/20 0154  WBC 8.9 10.2  HGB 10.2* 9.6*  HCT 30.3* 29.2*  PLT 519* 474*   BMET:  Recent Labs    04/12/20 0429 04/13/20 0154  NA 135 139  K 4.2 4.2  CL 101 105  CO2 26 25  GLUCOSE 95 97  BUN 12 17  CREATININE 0.52 0.65  CALCIUM 8.8* 8.3*    PT/INR: No results for input(s): LABPROT, INR in the last 72 hours. ABG:  INR: Will add last result for INR, ABG once components are confirmed Will add last 4 CBG results once components are confirmed  Assessment/Plan:  1. CV - SR/ST with HR in the 90's this am. On Lopressor 12.5 mg bid. Per primary 2.  Pulmonary - On room air. Chest tube with 30 cc last 24 hours. Chest tube is to water seal and has a small air leak with cough. CXR appears to show patient rotated to the right, moderate left hydropneumothorax-? Increased but with positioning difficult to tell. Chest tubes to remain for now. Encourage incentive spirometer. 3. CBGs 94/110/88. On Insulin  PRN 4. Anemia-H and H this am 10.2 and 30.3 5. ID-on Ceftriaxone 2 grams IV daily. She needs 2 weeks of IV antibiotics followed by 4 weeks of oral antibiotic  Donielle M ZimmermanPA-C 04/14/2020,7:39 AM [phone] Patient seen and examined, agree with above Difficult to tell if there is a larger space- I suspect it is mostly due to rotation. There is a strong likelihood she will need another thoracic procedure given the space and ongoing air leak. With the new finding of TR I think we should get IDs input re: length of IV antibiotics  Viviann Spare C. Dorris Fetch, MD [facility] [phone]

## 2020-04-14 NOTE — Progress Notes (Signed)
   04/14/20 2011  Assess: MEWS Score  BP 99/70  ECG Heart Rate (!) 115  Resp 20  Level of Consciousness Alert  SpO2 97 %  O2 Device Room Air  Assess: MEWS Score  MEWS Temp 0  MEWS Systolic 1  MEWS Pulse 2  MEWS RR 0  MEWS LOC 0  MEWS Score 3  MEWS Score Color Yellow  Assess: if the MEWS score is Yellow or Red  Were vital signs taken at a resting state? Yes  Focused Assessment No change from prior assessment  Early Detection of Sepsis Score *See Row Information* Low  MEWS guidelines implemented *See Row Information* No, previously yellow, continue vital signs every 4 hours  Treat  MEWS Interventions Administered prn meds/treatments  Pain Scale 0-10  Pain Score 8  Pain Type Acute pain  Pain Location Chest  Pain Orientation Left;Anterior;Lateral  Pain Descriptors / Indicators Sharp;Sore  Pain Frequency Intermittent  Pain Onset With Activity  Pain Intervention(s) Medication (See eMAR)  Complains of Anxiety  Interventions Medication (see MAR)  Neuro symptoms relieved by Anti-anxiety medication;Rest  Take Vital Signs  Increase Vital Sign Frequency  Yellow: Q 2hr X 2 then Q 4hr X 2, if remains yellow, continue Q 4hrs (pt was previuosly yellow from 1220, stay Q4 VS)  Escalate  MEWS: Escalate Yellow: discuss with charge nurse/RN and consider discussing with provider and RRT  Notify: Charge Nurse/RN  Name of Charge Nurse/RN Notified Tobi Bastos  Date Charge Nurse/RN Notified 04/14/20  Time Charge Nurse/RN Notified 2051  Document  Patient Outcome Other (Comment) (pt has been yellow, previous shift, continue Q4)  Progress note created (see row info) Yes    Pt has been yellow seen previous shift at 1210, yellow mews protocol should have been in progress already, will continue Q4 vitals

## 2020-04-14 NOTE — Progress Notes (Signed)
She will complete 14 of ceftriaxone (12/30) then transition to PO cefdinir for an additional 4 wks to complete 6 wks of therapy. Per Dr. Jerral Ralph.  Ulyses Southward, PharmD, BCIDP, AAHIVP, CPP Infectious Disease Pharmacist 04/14/2020 10:13 AM

## 2020-04-14 NOTE — Progress Notes (Addendum)
PROGRESS NOTE                                                                                                                                                                                                             Patient Demographics:    Alyssa Vargas, is a 34 y.o. female, DOB - Sep 03, 1985, XBJ:[id number]  Outpatient Primary MD for the patient is Patient, No Pcp Per   Admit date - 04/01/2020   LOS - 12  Chief Complaint  Patient presents with  . Covid Exposure       Brief Narrative: Patient is a 34 y.o. female with PMHx of IVDA (heroin/methamphetamine)-who tested positive for COVID-19 on 12/11 (home test)-presented to [facility] on 12/16 with dyspnea, cough-found to have a large left-sided pleural effusion-underwent diagnostic thoracocentesis on 12/17-which was consistent with empyema-subsequently seen by CT surgery-and underwent VATS/decortication on 12/20.  See below for further details  COVID-19 vaccinated status: Unvaccinated  Significant Events: 12/11>> Covid positive-home test per patient report. 12/16>> Admit to [facility] for left-sided pleural effusion-COVID-19 infection 12/18>> transfer to [facility] for CT surgery evaluation  Significant studies: 12/17>> x-ray chest: Complete whiteout of left hemothorax 12/17>> CTA chest: No PE, massive left pleural effusion/necrotizing pneumonia 12/18>> TTE: EF 60-65%, small pericardial effusion 12/27>> TTE: EF 65-70%, tricuspid valve thickening with eccentric regurgitation  Procedures: 12/17>> ultrasound guided thoracocentesis by radiology 12/18>> CT-guided 14 French left chest tube placement by IR 12/20>> left VATS/empyema drainage, decortication  Microbiology data: 12/17>> left pleural fluid culture: Pansensitive Streptococcus pneumoniae 12/17>> blood culture: No growth  COVID-19 medications: Steroids: 12/17>> 12/21 Remdesivir: 12/17>> 12/21  Antibiotics: Rocephin:  12/21>> Vancomycin: 12/17>> 12/19 Zosyn: 12/17>> 12/19  Consults: PCCM, cardiothoracic surgery  DVT prophylaxis: SCD's Start: 04/05/20 1453 enoxaparin (LOVENOX) injection 40 mg Start: 04/04/20 1400    Subjective:    Alyssa Vargas today continues to have pain at the operative site-however she appears comfortable.   Assessment  & Plan :   Acute hypoxic respiratory failure due to large left-sided empyema: Hypoxia has resolved-s/p VATS procedure on 12/20.  Pleural fluid culture positive for Streptococcus pneumoniae-thankfully blood cultures negative.  Chest tube in place-CT surgery following.  Recommendations from [facility] are for 2 weeks of IV Rocephin from 12/20-followed by 4 weeks of oral antimicrobial therapy.  Given tricuspid regurgitation-see below-we will get ID opinion as well.  Per CT  surgery-due to persistent air leak-May require another thoracic procedure.  COVID-19 infection: Has been treated with steroid/Remdesivir-hypoxia was mostly from empyema-rather than COVID-19 related parenchymal infection.  With clinical improvement-suspect she no longer requires isolation-and should be okay to move out from 5 W.  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  Pericardial effusion: Seen on echo on 12/18-resolved on echo on 12/28.  Could be related to viral infection.  Depression/anorexia: Continue BuSpar  History of IVDA-heroin/methamphetamine  Tobacco abuse: Continue transdermal nicotine  Nutrition Problem: Nutrition Problem: Severe Malnutrition Etiology: acute illness (COVID 19 infection) Signs/Symptoms: moderate fat depletion,severe muscle depletion,energy intake < or equal to 50% for > or equal to 5 days Interventions: Ensure Enlive (each supplement provides 350kcal and 20 grams of protein),Magic cup  ABG: No results found for:  PHART, PCO2ART, PO2ART, HCO3, TCO2, ACIDBASEDEF, O2SAT  Vent Settings: N/A  Condition - Stable  Family Communication  : Patient prefers to update family herself.  Code Status :  Full Code  Diet :  Diet Order            Diet regular Room service appropriate? Yes; Fluid consistency: Thin  Diet effective now                  Disposition Plan  :   Status is: Inpatient  Remains inpatient appropriate because:Inpatient level of care appropriate due to severity of illness   Dispo: The patient is from: Home              Anticipated d/c is to: Home              Anticipated d/c date is: > 3 days              Patient currently is not medically stable to d/c.   Barriers to discharge: Empyema-chest tube in place-on IV antibiotics  Antimicorbials  :    Anti-infectives (From admission, onward)   Start     Dose/Rate Route Frequency Ordered Stop   04/16/20 1000  cefdinir (OMNICEF) capsule 600 mg        600 mg Oral Daily 04/14/20 1011 05/14/20 0959   04/06/20 1045  cefTRIAXone (ROCEPHIN) 2 g in sodium chloride 0.9 % 100 mL IVPB        2 g 200 mL/hr over 30 Minutes Intravenous Every 24 hours 04/06/20 0945 04/15/20 2359   04/05/20 1100  cefTRIAXone (ROCEPHIN) 2 g in sodium chloride 0.9 % 100 mL IVPB  Status:  Discontinued        2 g 200 mL/hr over 30 Minutes Intravenous Every 24 hours 04/05/20 1023 04/05/20 1453   04/03/20 1600  vancomycin (VANCOREADY) IVPB 750 mg/150 mL  Status:  Discontinued        750 mg 150 mL/hr over 60 Minutes Intravenous Every 12 hours 04/03/20 1500 04/05/20 1020   04/03/20 1000  remdesivir 100 mg in sodium chloride 0.9 % 100 mL IVPB  Status:  Discontinued       "Followed by" Linked Group Details   100 mg 200 mL/hr over 30 Minutes Intravenous Daily 04/02/20 0654 04/02/20 0656   04/03/20 1000  remdesivir 100 mg in sodium chloride 0.9 % 100 mL IVPB       "Followed by" Linked Group Details   100 mg 200 mL/hr over 30 Minutes Intravenous Daily 04/02/20 0658  04/06/20 0948   04/03/20 0400  vancomycin (VANCOREADY) IVPB 500  mg/100 mL  Status:  Discontinued        500 mg 100 mL/hr over 60 Minutes Intravenous Every 12 hours 04/02/20 1459 04/03/20 1500   04/02/20 2200  piperacillin-tazobactam (ZOSYN) IVPB 3.375 g  Status:  Discontinued        3.375 g 12.5 mL/hr over 240 Minutes Intravenous Every 8 hours 04/02/20 1332 04/05/20 1023   04/02/20 1515  vancomycin (VANCOREADY) IVPB 1250 mg/250 mL  Status:  Discontinued        1,250 mg 166.7 mL/hr over 90 Minutes Intravenous Every 24 hours 04/02/20 1449 04/02/20 1449   04/02/20 1515  vancomycin (VANCOREADY) IVPB 1250 mg/250 mL        1,250 mg 166.7 mL/hr over 90 Minutes Intravenous  Once 04/02/20 1449 04/02/20 1701   04/02/20 1345  piperacillin-tazobactam (ZOSYN) IVPB 3.375 g        3.375 g 100 mL/hr over 30 Minutes Intravenous  Once 04/02/20 1332 04/02/20 1434   04/02/20 0900  remdesivir 100 mg in sodium chloride 0.9 % 100 mL IVPB       "Followed by" Linked Group Details   100 mg 200 mL/hr over 30 Minutes Intravenous  Once 04/02/20 0658 04/02/20 1251   04/02/20 0800  remdesivir 100 mg in sodium chloride 0.9 % 100 mL IVPB       "Followed by" Linked Group Details   100 mg 200 mL/hr over 30 Minutes Intravenous  Once 04/02/20 0658 04/02/20 0853   04/02/20 0700  remdesivir 200 mg in sodium chloride 0.9% 250 mL IVPB  Status:  Discontinued       "Followed by" Linked Group Details   200 mg 580 mL/hr over 30 Minutes Intravenous Once 04/02/20 0654 04/02/20 0656      Inpatient Medications  Scheduled Meds: . acidophilus  2 capsule Oral Daily  . albuterol  2 puff Inhalation BID  . bisacodyl  5 mg Oral Daily  . busPIRone  10 mg Oral TID  . [START ON 04/16/2020] cefdinir  600 mg Oral Daily  . Chlorhexidine Gluconate Cloth  6 each Topical Daily  . dextromethorphan-guaiFENesin  1 tablet Oral BID  . enoxaparin (LOVENOX) injection  40 mg Subcutaneous Q24H  . feeding supplement  237 mL Oral TID BM  . fentaNYL  (SUBLIMAZE) injection  12.5 mcg Intravenous Once  . insulin aspart  0-5 Units Subcutaneous QHS  . insulin aspart  0-9 Units Subcutaneous TID WC  . lidocaine  1 patch Transdermal Q24H  . metoprolol tartrate  12.5 mg Oral BID  . multivitamin with minerals  1 tablet Oral Daily  . nicotine  14 mg Transdermal Daily  . pantoprazole  40 mg Oral Daily  . senna-docusate  2 tablet Oral BID  . traZODone  100 mg Oral QHS   Continuous Infusions: . sodium chloride    . sodium chloride 10 mL/hr at 04/07/20 1600  . cefTRIAXone (ROCEPHIN)  IV 2 g (04/14/20 0852)   PRN Meds:.Place/Maintain arterial line **AND** sodium chloride, albuterol, ALPRAZolam, guaiFENesin-dextromethorphan, HYDROmorphone (DILAUDID) injection, hydrOXYzine, ondansetron (ZOFRAN) IV, oxyCODONE, sodium chloride flush, zolpidem   Time Spent in minutes  25  See all Orders from today for further details   Jeoffrey Massed M.D on 04/14/2020 at 4:09 PM  To page go to [email or web address] - use universal password  [facility] -  Office  [phone]    Objective:   Vitals:   04/14/20 0753 04/14/20 0840 04/14/20 0841 04/14/20 1210  BP: 101/71   93/74  Pulse: (!) 102   Marland Kitchen)  108  Resp: 20  20 (!) 21  Temp: 98.4 F (36.9 C)   98.4 F (36.9 C)  TempSrc: Oral   Oral  SpO2: 95% 94%  96%  Weight:      Height:        Wt Readings from Last 3 Encounters:  04/02/20 50.2 kg  05/27/19 58.1 kg  05/26/19 58.1 kg     Intake/Output Summary (Last 24 hours) at 04/14/2020 1609 Last data filed at 04/14/2020 1000 Gross per 24 hour  Intake 240 ml  Output 30 ml  Net 210 ml     Physical Exam Gen Exam:Alert awake-not in any distress HEENT:atraumatic, normocephalic Chest: B/L clear to auscultation anteriorly CVS:S1S2 regular Abdomen:soft non tender, non distended Extremities:no edema Neurology: Non focal Skin: no rash   Data Review:    CBC Recent Labs  Lab 04/09/20 0214 04/10/20 0945 04/11/20 0324 04/12/20 0429  04/13/20 0154  WBC 9.8 11.9* 10.6* 8.9 10.2  HGB 9.8* 11.1* 10.2* 10.2* 9.6*  HCT 29.7* 34.3* 31.3* 30.3* 29.2*  PLT 480* 621* 534* 519* 474*  MCV 84.6 84.9 86.5 84.4 86.4  MCH 27.9 27.5 28.2 28.4 28.4  MCHC 33.0 32.4 32.6 33.7 32.9  RDW 14.7 15.0 15.5 15.3 15.5    Chemistries  Recent Labs  Lab 04/09/20 0214 04/10/20 0945 04/11/20 0324 04/12/20 0429 04/13/20 0154  NA 137 136 138 135 139  K 4.3 4.8 4.6 4.2 4.2  CL 105 102 101 101 105  CO2 25 23 26 26 25   GLUCOSE 116* 72 82 95 97  BUN 11 7 9 12 17   CREATININE 0.56 0.52 0.59 0.52 0.65  CALCIUM 8.0* 8.6* 8.6* 8.8* 8.3*  AST 14* 14* 15 13* 17  ALT 14 16 15 14 16   ALKPHOS 49 55 55 52 50  BILITOT 0.5 0.4 0.5 0.3 0.6   ------------------------------------------------------------------------------------------------------------------ No results for input(s): CHOL, HDL, LDLCALC, TRIG, CHOLHDL, LDLDIRECT in the last 72 hours.  No results found for: HGBA1C ------------------------------------------------------------------------------------------------------------------ No results for input(s): TSH, T4TOTAL, T3FREE, THYROIDAB in the last 72 hours.  Invalid input(s): FREET3 ------------------------------------------------------------------------------------------------------------------ No results for input(s): VITAMINB12, FOLATE, FERRITIN, TIBC, IRON, RETICCTPCT in the last 72 hours.  Coagulation profile No results for input(s): INR, PROTIME in the last 168 hours.  No results for input(s): DDIMER in the last 72 hours.  Cardiac Enzymes No results for input(s): CKMB, TROPONINI, MYOGLOBIN in the last 168 hours.  Invalid input(s): CK ------------------------------------------------------------------------------------------------------------------ No results found for: BNP  Micro Results No results found for this or any previous visit (from the past 240 hour(s)).  Radiology Reports DG Chest 1 View  Result Date:  04/14/2020 CLINICAL DATA:  Empyema, chest tube EXAM: CHEST  1 VIEW COMPARISON:  04/13/2020 FINDINGS: Two left-sided chest tubes remain present. Left hydropneumothorax is again identified with increased pneumothorax component. Left lung atelectasis/consolidation. Small patchy opacity the lower right lung is unchanged. Similar cardiomediastinal contours. IMPRESSION: Left hydropneumothorax with increased pneumothorax component. Two left-sided chest tubes remain in place. Left lung atelectasis/consolidation. Unchanged small right lower lung focal atelectasis/consolidation. Electronically Signed   By: Guadlupe Spanish M.D.   On: 04/14/2020 08:08   DG Chest 1 View  Result Date: 04/12/2020 CLINICAL DATA:  Empyema. EXAM: CHEST  1 VIEW COMPARISON:  April 10, 2020 FINDINGS: The cardiomediastinal silhouette is unchanged in contour. Multiple LEFT-sided chest tubes. Unchanged small to moderate LEFT hydropneumothorax with a loculated apical component. Persistent heterogeneous opacification of base and periphery of the LEFT lung. Visualized abdomen is unremarkable. No acute  osseous abnormality. IMPRESSION: Unchanged small to moderate LEFT hydropneumothorax with a loculated apical component. Electronically Signed   By: Meda Klinefelter MD   On: 04/12/2020 07:52   DG Chest 1 View  Result Date: 04/09/2020 CLINICAL DATA:  34 year old female with pneumothorax status post chest tube placement. EXAM: CHEST  1 VIEW COMPARISON:  Chest radiograph dated 04/09/2020 and CT dated 04/02/2020 FINDINGS: Two left sided chest tubes appear in similar position. No significant interval change in the size of the left pneumothorax compared to the prior radiograph. Left subclavian central venous catheter in similar position. Bilobed nodular density in the right mid lung field as seen previously. Stable cardiomediastinal silhouette. No acute osseous pathology. IMPRESSION: No significant interval change in the size of the left pneumothorax  compared to the prior radiograph. Two left-sided chest tubes appear in similar position. Electronically Signed   By: Elgie Collard M.D.   On: 04/09/2020 19:10   CT Angio Chest PE W and/or Wo Contrast  Result Date: 04/02/2020 CLINICAL DATA:  COVID exposure, dyspnea, cough, malaise, positive D-dimer EXAM: CT ANGIOGRAPHY CHEST WITH CONTRAST TECHNIQUE: Multidetector CT imaging of the chest was performed using the standard protocol during bolus administration of intravenous contrast. Multiplanar CT image reconstructions and MIPs were obtained to evaluate the vascular anatomy. CONTRAST:  OMNIPAQUE IOHEXOL 350 MG/ML SOLN COMPARISON:  None. FINDINGS: Cardiovascular: There is adequate opacification of the pulmonary arterial tree. There is no intraluminal filling defect identified to suggest acute pulmonary embolism. The central pulmonary arteries are of normal caliber. There is marked mediastinal shift to the right. Global cardiac size within normal limits. No significant coronary artery calcification. No pericardial effusion. The thoracic aorta is unremarkable. Mediastinum/Nodes: Thyroid unremarkable. Soft tissue within the anterior mediastinum likely represents rebound thymic or residual thymic tissue. The esophagus is unremarkable. No pathologic thoracic adenopathy is identified. Lungs/Pleura: There is a massive left pleural effusion which completely fills the left hemithorax, completely collapses the left lung, and demonstrates marked mass effect upon the mediastinum with marked mediastinal shift to the right. There is interstitial gas within a portion of the left lower lobe likely representing the lateral segment of the left lower lobe which may represent necrosis of the setting of necrotizing pneumonia. Mild patchy ground-glass infiltrate within the a right upper lobe anteriorly is nonspecific, possibly infectious or inflammatory. A bilobed fluid density structure is seen within the right middle lobe  measuring 2.3 x 3.6 x 1.6 cm in greatest dimension, nonspecific, but possibly the sequela of prior infection or trauma. Upper Abdomen: No acute abnormality. Musculoskeletal: No acute bone abnormality. Review of the MIP images confirms the above findings. IMPRESSION: No pulmonary embolism. Massive left pleural effusion, possibly representing a a parapneumonic effusion or empyema, demonstrating marked mass effect upon the mediastinum with marked left right mediastinal shift. Extensive interstitial gas within the probable lateral segment of the right lower lobe suggesting parenchymal necrosis in the setting of necrotizing pneumonia. Bilobed fluid-filled structure within the right middle lobe measuring 3.6 cm possibly representing the sequela of remote trauma or inflammation. Minimal patchy infiltrate within the right upper lobe, likely infectious or inflammatory. Electronically Signed   By: Helyn Numbers MD   On: 04/02/2020 04:18   DG CHEST PORT 1 VIEW  Result Date: 04/13/2020 CLINICAL DATA:  Pneumothorax.  Chest tube.  COVID. EXAM: PORTABLE CHEST 1 VIEW COMPARISON:  04/12/2020. FINDINGS: Two left chest tubes in stable position. Stable left-sided pneumothorax. Stable atelectatic changes left lung. Stable left-sided pleural thickening. Mild atelectasis/infiltrate right mid  lung noted on today's exam. Heart size stable. Thoracic spine scoliosis. No acute bony abnormality. IMPRESSION: 1. Two left chest tubes in stable position. Stable left-sided pneumothorax. Stable atelectatic changes left lung. Stable left-sided pleural thickening. 2. Mild atelectasis/infiltrate right mid lung noted on today's exam. Electronically Signed   By: Maisie Fus  Register   On: 04/13/2020 06:12   DG Chest Port 1 View  Result Date: 04/10/2020 CLINICAL DATA:  Chest tube in place EXAM: PORTABLE CHEST 1 VIEW COMPARISON:  Yesterday FINDINGS: Loculated appearing basal and lateral left pneumothorax, possibly ex vacuo given the degree of pleural  disease, unchanged. Chest tubes are in stable position. Normal heart size. Right base nodularity is unchanged. IMPRESSION: Unchanged loculated appearing pneumothorax on the left. Electronically Signed   By: Marnee Spring M.D.   On: 04/10/2020 08:50   DG Chest Port 1 View  Result Date: 04/09/2020 CLINICAL DATA:  Vats/empyema. Left-sided chest tube placement. COVID positive. EXAM: PORTABLE CHEST 1 VIEW COMPARISON:  04/08/2020 FINDINGS: Left subclavian line tip at mid SVC. Mild tracheal deviation to the left. Normal heart size. Two left chest tubes are unchanged in position. Decrease in subcutaneous emphysema about the left chest wall. Moderate left-sided hydropneumothorax is similar. Interstitial thickening throughout the right lung. Persistent right midlung nodular opacity. IMPRESSION: Relatively similar appearance of the chest since 1 day prior. Left-sided hydropneumothorax with 2 chest tubes in place. Minimal decrease in subcutaneous emphysema. Right midlung nodular opacity, similar. Electronically Signed   By: Jeronimo Greaves M.D.   On: 04/09/2020 13:07   DG CHEST PORT 1 VIEW  Result Date: 04/08/2020 CLINICAL DATA:  Follow-up chest tube EXAM: PORTABLE CHEST 1 VIEW COMPARISON:  Radiograph 04/05/2020 FINDINGS: *Left subclavian approach central venous catheter tip terminates at the left brachiocephalic-caval confluence. *There are 2 left apically directed chest tubes which remain in place. *Telemetry leads and external support devices overlie the chest. There is a persistent complex left hydropneumothorax, not significantly changed in size from prior with persistent apical capping and lateral basal air lucency. Some more focal opacity along the pleural surfaces, could reflect a developing pleural rind with more coalescent opacity in the left lung base though only partially re-expanded. Redemonstrated bilobed nodular opacity present in the right lung base as well with additional streaky opacities favoring  residual atelectasis or edema. Cardiomediastinal contours are unchanged. Extensive subcutaneous emphysema across the left chest wall and base of the left neck. IMPRESSION: 1. Stable complex left hydropneumothorax with 2 chest tubes in place. Overall size is unchanged from prior. 2. Some more focal opacity along the pleural surfaces, could reflect a developing pleural rind with more coalescent opacity in the left lung base though only partially re-expanded. 3. Extensive subcutaneous emphysema across the left chest wall and base of the left neck. 4. Stable bilobed nodular opacity in the right middle lobe. Some additional atelectatic changes and/or edema bilaterally. Electronically Signed   By: Kreg Shropshire M.D.   On: 04/08/2020 22:04   DG CHEST PORT 1 VIEW  Result Date: 04/08/2020 CLINICAL DATA:  Pneumothorax. EXAM: PORTABLE CHEST 1 VIEW COMPARISON:  04/07/2020 and prior. FINDINGS: Lateral left basilar/sub pulmonic pneumothorax is unchanged. Apically terminating dual left chest tubes. Left subclavian CVC is unchanged with tip overlying the azygos. Left lung volume loss and basilar opacities, similar prior exam. Clear right lung. IMPRESSION: Lateral left basilar/subpulmonic pneumothorax is unchanged. Indwelling apically terminating left chest tubes. Electronically Signed   By: Stana Bunting M.D.   On: 04/08/2020 09:06   DG Chest Port 1  View  Result Date: 04/07/2020 CLINICAL DATA:  Coronavirus infection.  Chest tube. EXAM: PORTABLE CHEST 1 VIEW COMPARISON:  04/06/2020 FINDINGS: Left subclavian central line tip the level of the azygos vein as seen previously. Two left chest tubes remain in place. Less pleural air than was seen yesterday. Small to moderate left pleural air collection does persist. Persistent infiltrate and volume loss in the left lung. Small amount of pleural fluid on the left. Few mild patchy infiltrates in the right lung appear similar. IMPRESSION: Less pleural air on the left than was  seen yesterday. Small to moderate left pleural air collection does persist. Persistent infiltrate and volume loss in the left lung. Few mild patchy infiltrates in the right lung appear similar. Electronically Signed   By: Paulina Fusi M.D.   On: 04/07/2020 08:09   DG CHEST PORT 1 VIEW  Result Date: 04/06/2020 CLINICAL DATA:  Chest tube status post VATS.  COVID positive. EXAM: PORTABLE CHEST 1 VIEW COMPARISON:  04/05/2020.  CT 04/02/2020. FINDINGS: Left subclavian line and 2 left chest tubes in stable position. Interim slight progression of left-sided pneumothorax. Stable left-sided pleural thickening. Persistent left lung atelectasis/infiltrate. Persistent density noted over the right lower chest. This density was noted to be within the right lung base on prior CT. This could represent a collection of pleural fluid and or atelectasis. Heart size stable. Left chest wall and supraclavicular subcutaneous emphysema again noted. IMPRESSION: 1. Stable positioning of left subclavian line and 2 left chest tubes. Interim slight progression of left-sided pneumothorax. Left chest wall and supraclavicular subcutaneous emphysema again noted without interim change. 2. Persistent left lung atelectasis/infiltrate. Stable left-sided pleural thickening. 3. Persistent density noted the right lower chest consistent with fluid pseudotumor and or atelectasis. Electronically Signed   By: Maisie Fus  Register   On: 04/06/2020 06:42   DG Chest Port 1 View  Result Date: 04/05/2020 CLINICAL DATA:  Chest tube in place. EXAM: PORTABLE CHEST 1 VIEW COMPARISON:  04/05/2020. FINDINGS: Left IJ line noted with tip over SVC. Interval removal of left pigtail chest tube. Interval placement of 2 large bore chest tubes. Interval near complete resolution of large left pleural effusion. Small pneumothorax noted. Atelectasis/infiltrate left lung. Density noted over the right lung may be overlying the chest. Heart size normal. Left chest wall  subcutaneous emphysema. IMPRESSION: 1. Interim removal of left pigtail chest tube. Interval placement of 2 large bore chest tubes with near complete resolution of large left pleural effusion. Small left pneumothorax noted. 2. Associated atelectasis/infiltrate left lung. These results will be called to the ordering clinician or representative by the Radiologist Assistant, and communication documented in the PACS or [employer]. Electronically Signed   By: Maisie Fus  Register   On: 04/05/2020 16:26   DG Chest Port 1 View  Result Date: 04/05/2020 CLINICAL DATA:  Shortness of breath, COVID-19 positivity EXAM: PORTABLE CHEST 1 VIEW COMPARISON:  04/04/2020 FINDINGS: Cardiac shadow is obscured by the large left empyema. Right lung remains clear. Left hemithorax demonstrates opacification relatively similar to that seen on the prior exam. Pigtail catheter is noted in place. Slight improved aeration in the left upper lobe is noted. No bony abnormality is seen. IMPRESSION: Slight improved aeration on the left. Pigtail catheter remains in place with relatively CT stable left empyema. Electronically Signed   By: Alcide Clever M.D.   On: 04/05/2020 09:04   DG Chest Port 1 View  Result Date: 04/04/2020 CLINICAL DATA:  Shortness of breath.  History of COVID. EXAM: PORTABLE CHEST  1 VIEW COMPARISON:  Chest radiograph 04/02/2020. FINDINGS: Monitoring leads overlie the patient. Cardiac and mediastinal contours largely obscured. Interval insertion left chest tube. Interval decrease in size of left pleural fluid within the upper hemithorax and mid hemithorax. Similar-appearing nodular opacity right mid lung. IMPRESSION: 1. Interval insertion left chest tube with interval decrease in size of left pleural fluid. 2. Similar-appearing nodular opacity right mid lung. Electronically Signed   By: Annia Belt M.D.   On: 04/04/2020 10:46   DG Chest Portable 1 View  Result Date: 04/02/2020 CLINICAL DATA:  Large LEFT pleural  effusion EXAM: PORTABLE CHEST 1 VIEW COMPARISON:  Portable exam at 1203 compared to 0227 hrs FINDINGS: Resolution of previously identified LEFT to RIGHT mediastinal shift. Unable to assess heart size due to subtotal opacification of the LEFT hemithorax. Minimal aeration now seen in LEFT lung. No pneumothorax. Nodular foci at inferior RIGHT lung again seen with minimal RIGHT basilar atelectasis. Osseous structures unremarkable. IMPRESSION: No pneumothorax following LEFT thoracentesis. Resolution of LEFT to RIGHT mediastinal shift. Persistent nodular foci at inferior RIGHT hemithorax. Electronically Signed   By: Ulyses Southward M.D.   On: 04/02/2020 12:32   DG Chest Port 1 View  Result Date: 04/02/2020 CLINICAL DATA:  COVID, shortness of breath EXAM: PORTABLE CHEST 1 VIEW COMPARISON:  None. FINDINGS: Complete whiteout of the left hemithorax, likely related to a combination of effusion and airspace disease. Heart and mediastinal structures are shifted to the right. Right mid lung patchy airspace disease. No effusion on the right. No acute bony abnormality. IMPRESSION: IMPRESSION Complete opacification of the left hemithorax, likely related to effusion and airspace disease. Patchy right mid lung airspace disease. Electronically Signed   By: Charlett Nose M.D.   On: 04/02/2020 02:48   CT [facility] PLEURAL DRAIN W/INDWELL CATH W/IMG GUIDE  Result Date: 04/03/2020 CLINICAL DATA:  Large left pleural empyema, COVID-19 infection and history of IV drug use. Request for percutaneous thoracostomy tube placement to drain the empyema. EXAM: CT GUIDED CATHETER DRAINAGE OF LEFT PLEURAL EMPYEMA ANESTHESIA/SEDATION: 2.0 mg IV Versed 100 mcg IV Fentanyl Total Moderate Sedation Time:  10 minutes The patient's level of consciousness and physiologic status were continuously monitored during the procedure by Radiology nursing. PROCEDURE: The procedure, risks, benefits, and alternatives were explained to the patient. Questions regarding the  procedure were encouraged and answered. The patient understands and consents to the procedure. A time out was performed prior to initiating the procedure. CT was performed through the chest in a supine position. The left chest wall was prepped with chlorhexidine in a sterile fashion, and a sterile drape was applied covering the operative field. A sterile gown and sterile gloves were used for the procedure. Local anesthesia was provided with 1% Lidocaine. Under CT guidance, an 18 gauge trocar needle was advanced into the left pleural space from an anterolateral approach. After confirming needle tip position, fluid was aspirated. A guidewire was advanced and the needle removed. The percutaneous tract was dilated and a 14 French pigtail drainage catheter advanced into the left pleural space. Catheter position was confirmed by CT. The pigtail catheter was connected to a Pleur-evac device. The catheter was secured at the skin with a Prolene retention suture, StatLock device and dressed with a Vaseline gauze dressing. COMPLICATIONS: None FINDINGS: A large amount of residual left pleural fluid remains despite large volume thoracentesis yesterday. Aspiration from the pleural space yielded grossly purulent fluid. Repeat laboratory analysis was not performed as the fluid removed at the time of thoracentesis  yesterday was sent for laboratory testing. After placement of the 14 French drainage catheter, there is good return of purulent fluid. The Pleur-evac will be connected to wall suction at -20 cm of water when the patient returns to her hospital room. IMPRESSION: CT-guided placement of 14 French left chest tube. A 14 French drainage catheter was placed with return of purulent fluid. The drainage catheter was attached to a Pleur-evac device which will be attached to wall suction. Electronically Signed   By: Irish LackGlenn  Yamagata M.D.   On: 04/03/2020 13:33   ECHOCARDIOGRAM LIMITED  Result Date: 04/12/2020    ECHOCARDIOGRAM  LIMITED REPORT   Patient Name:   Alyssa SetaHEATHER Vargas Date of Exam: 04/12/2020 Medical Rec #:  [id number]     Height:       67.0 in Accession #:    [id number]    Weight:       110.7 lb Date of Birth:  July 10, 1985     BSA:          1.573 m Patient Age:    34 years      BP:           94/63 mmHg Patient Gender: F             HR:           106 bpm. Exam Location:  Inpatient Procedure: Limited Echo, Cardiac Doppler and Color Doppler Indications:    I31.3 Pericardial effusion (noninflammatory)  History:        Patient has prior history of Echocardiogram examinations, most                 recent 04/03/2020. Abnormal ECG; Risk Factors:Current Smoker.                 Covid positive. IVDU.  Sonographer:    Sheralyn Boatmanina West RDCS Referring Phys: [id number] DAWOOD S [facility]  Sonographer Comments: Technically difficult study due to poor echo windows. Chest tube in apical region. IMPRESSIONS  1. Left ventricular ejection fraction, by estimation, is 65 to 70%. The left ventricle has normal function. The left ventricle has no regional wall motion abnormalities. There is mild concentric left ventricular hypertrophy. Left ventricular diastolic function could not be evaluated.  2. Right ventricular systolic function is normal. The right ventricular size is mildly enlarged. There is normal pulmonary artery systolic pressure.  3. No evidence of mitral valve regurgitation.  4. Tricuspid valve thickening noted with eccentric regurgitation.  5. The aortic valve is tricuspid. Aortic valve regurgitation is not visualized.  6. The inferior vena cava is normal in size with greater than 50% respiratory variability, suggesting right atrial pressure of 3 mmHg. Comparison(s): A prior study was performed on 04/03/20. Difficult comparison from prior: Patient was not able to tolerate full study 04/03/20. In this study, tricuspid regurgitation is more prominent with tricuspid valve thickening and no gross vegetation. Pericardial effusion has resolved. FINDINGS  Left  Ventricle: Left ventricular ejection fraction, by estimation, is 65 to 70%. The left ventricle has normal function. The left ventricle has no regional wall motion abnormalities. The left ventricular internal cavity size was normal in size. There is  mild concentric left ventricular hypertrophy. Left ventricular diastolic function could not be evaluated. Right Ventricle: The right ventricular size is mildly enlarged. Right ventricular systolic function is normal. There is normal pulmonary artery systolic pressure. The tricuspid regurgitant velocity is 2.16 m/s, and with an assumed right atrial pressure of 3 mmHg, the estimated right ventricular systolic pressure is  21.7 mmHg. Left Atrium: Left atrial size was normal in size. Pericardium: There is no evidence of pericardial effusion. Tricuspid Valve: Tricuspid valve thickening noted with eccentric regurgitation. Aortic Valve: The aortic valve is tricuspid. Aortic valve regurgitation is not visualized. Aorta: The aortic root and ascending aorta are structurally normal, with no evidence of dilitation. Venous: The inferior vena cava is normal in size with greater than 50% respiratory variability, suggesting right atrial pressure of 3 mmHg. LEFT VENTRICLE PLAX 2D LVIDd:         4.10 cm LVIDs:         3.00 cm LV PW:         1.20 cm LV IVS:        1.00 cm  IVC IVC diam: 1.30 cm LEFT ATRIUM         Index LA diam:    2.40 cm 1.53 cm/m   AORTA Ao Root diam: 2.80 cm Ao Asc diam:  2.20 cm TRICUSPID VALVE TR Peak grad:   18.7 mmHg TR Vmax:        216.00 cm/s Riley Lam MD Electronically signed by Riley Lam MD Signature Date/Time: 04/12/2020/5:33:47 PM    Final    ECHOCARDIOGRAM LIMITED  Result Date: 04/03/2020    ECHOCARDIOGRAM LIMITED REPORT   Patient Name:   Alyssa Vargas Date of Exam: 04/03/2020 Medical Rec #:  [id number]     Height:       67.0 in Accession #:    [id number]    Weight:       110.7 lb Date of Birth:  12/23/1985     BSA:          1.573 m  Patient Age:    34 years      BP:           109/79 mmHg Patient Gender: F             HR:           99 bpm. Exam Location:  Inpatient Procedure: Limited Echo, Limited Color Doppler and Cardiac Doppler Indications:    endocarditis  History:        Patient has no prior history of Echocardiogram examinations.                 Covid. Pleural effusion.; Risk Factors:Current Smoker and IV                 drug use.  Sonographer:    Delcie Roch Referring Phys: [id number] COURAGE EMOKPAE  Sonographer Comments: Image acquisition challenging due to respiratory motion. IMPRESSIONS  1. Left ventricular ejection fraction, by estimation, is 60 to 65%. The left ventricle has normal function.  2. Right ventricular systolic function is normal. The right ventricular size is normal.  3. A small pericardial effusion is present. The pericardial effusion is circumferential.  4. The mitral valve is normal in structure. No evidence of mitral valve regurgitation. No evidence of mitral stenosis.  5. The aortic valve was not well visualized. Aortic valve regurgitation is not visualized. No aortic stenosis is present.  6. The inferior vena cava is normal in size with greater than 50% respiratory variability, suggesting right atrial pressure of 3 mmHg. FINDINGS  Left Ventricle: Left ventricular ejection fraction, by estimation, is 60 to 65%. The left ventricle has normal function. Right Ventricle: The right ventricular size is normal. Right vetricular wall thickness was not well visualized. Right ventricular systolic function is normal. Pericardium: A small pericardial effusion is present. The  pericardial effusion is circumferential. Mitral Valve: The mitral valve is normal in structure. No evidence of mitral valve stenosis. Tricuspid Valve: The tricuspid valve is normal in structure. Tricuspid valve regurgitation is not demonstrated. No evidence of tricuspid stenosis. Aortic Valve: The aortic valve was not well visualized. Aortic valve  regurgitation is not visualized. No aortic stenosis is present. Pulmonic Valve: The pulmonic valve was normal in structure. Pulmonic valve regurgitation is not visualized. No evidence of pulmonic stenosis. Venous: The inferior vena cava is normal in size with greater than 50% respiratory variability, suggesting right atrial pressure of 3 mmHg. LEFT VENTRICLE PLAX 2D LVIDd:         3.90 cm LVIDs:         2.30 cm LV IVS:        0.70 cm LVOT diam:     1.60 cm LVOT Area:     2.01 cm  IVC IVC diam: 1.90 cm LEFT ATRIUM         Index LA diam:    2.10 cm 1.34 cm/m   AORTA Ao Root diam: 2.20 cm MITRAL VALVE MV Area (PHT): 5.23 cm    SHUNTS MV Decel Time: 145 msec    Systemic Diam: 1.60 cm MV E velocity: 88.70 cm/s MV A velocity: 61.70 cm/s MV E/A ratio:  1.44 Dina Rich MD Electronically signed by Dina Rich MD Signature Date/Time: 04/03/2020/2:31:28 PM    Final    US THORACENTESIS ASP PLEURAL SPACE W/IMG GUIDE  Result Date: 04/02/2020 INDICATION: Large LEFT pleural effusion EXAM: ULTRASOUND GUIDED DIAGNSOTIC AND THERAPEUTIC LEFT THORACENTESIS MEDICATIONS: None. COMPLICATIONS: None immediate. PROCEDURE: Procedure, benefits, and risks of procedure were discussed with patient. Written informed consent for procedure was obtained. Time out protocol followed. Pleural effusion localized by ultrasound at the posterior LEFT hemithorax. Fluid is markedly complex containing diffuse internal echogenicity as well as dependent echogenicity. Skin prepped and draped in usual sterile fashion. Skin and soft tissues anesthetized with 10 mL of 1% lidocaine. 8 French thoracentesis catheter placed into the LEFT pleural space. 1.88 L of complex cloudy tan fluid with particulates with purulent appearance aspirated by syringe pump. Findings most likely represent empyema. Procedure tolerated well by patient without immediate complication. FINDINGS: Complex purulent appearing LEFT pleural fluid likely representing empyema.  IMPRESSION: Successful ultrasound guided LEFT thoracentesis yielding 1.88 L of pleural fluid. Electronically Signed   By: Ulyses Southward M.D.   On: 04/02/2020 12:39

## 2020-04-15 ENCOUNTER — Inpatient Hospital Stay (HOSPITAL_COMMUNITY): Payer: Medicaid Other

## 2020-04-15 DIAGNOSIS — J869 Pyothorax without fistula: Secondary | ICD-10-CM | POA: Diagnosis not present

## 2020-04-15 LAB — GLUCOSE, CAPILLARY
Glucose-Capillary: 96 mg/dL (ref 70–99)
Glucose-Capillary: 77 mg/dL (ref 70–99)
Glucose-Capillary: 90 mg/dL (ref 70–99)
Glucose-Capillary: 88 mg/dL (ref 70–99)

## 2020-04-15 MED ORDER — DIGOXIN 0.25 MG/ML IJ SOLN
0.1250 mg | Freq: Three times a day (TID) | INTRAMUSCULAR | Status: AC
  Administered 2020-04-15 (×3): 0.125 mg via INTRAVENOUS
  Filled 2020-04-15: qty 0.5
  Filled 2020-04-15: qty 2
  Filled 2020-04-15: qty 0.5

## 2020-04-15 NOTE — Plan of Care (Signed)
  Problem: Education: Goal: Knowledge of General Education information will improve Description: Including pain rating scale, medication(s)/side effects and non-pharmacologic comfort measures Outcome: Progressing   Problem: Health Behavior/Discharge Planning: Goal: Ability to manage health-related needs will improve Outcome: Progressing   Problem: Clinical Measurements: Goal: Ability to maintain clinical measurements within normal limits will improve Outcome: Progressing Goal: Will remain free from infection Outcome: Progressing Goal: Diagnostic test results will improve Outcome: Progressing Goal: Cardiovascular complication will be avoided Outcome: Progressing   Problem: Activity: Goal: Risk for activity intolerance will decrease Outcome: Progressing   Problem: Nutrition: Goal: Adequate nutrition will be maintained Outcome: Progressing   Problem: Coping: Goal: Level of anxiety will decrease Outcome: Progressing   Problem: Safety: Goal: Ability to remain free from injury will improve Outcome: Progressing   Problem: Education: Goal: Knowledge of risk factors and measures for prevention of condition will improve Outcome: Progressing   Problem: Coping: Goal: Psychosocial and spiritual needs will be supported Outcome: Progressing   Problem: Respiratory: Goal: Will maintain a patent airway Outcome: Progressing Goal: Complications related to the disease process, condition or treatment will be avoided or minimized Outcome: Progressing   Problem: Pain Managment: Goal: General experience of comfort will improve Outcome: Not Progressing

## 2020-04-15 NOTE — Progress Notes (Addendum)
PROGRESS NOTE  Alyssa Vargas VFI:[id number] DOB: 1985/10/06 DOA: 04/01/2020 PCP: Patient, No Pcp Per  Brief History   Patient is a 34 y.o. female with PMHx of IVDA (heroin/methamphetamine)-who tested positive for COVID-19 on 12/11 (home test)-presented to [facility] on 12/16 with dyspnea, cough-found to have a large left-sided pleural effusion-underwent diagnostic thoracocentesis on 12/17-which was consistent with empyema-subsequently seen by CT surgery-and underwent VATS/decortication on 12/20.    The patient's chest tube is still in place. She continues to have significant pain from the chest tube. Narcotic pain control is limited due to the patient's hypotension. Heart rate has also been high. It is sinus tachycardia. Likely at least in part due to pain.  She is being loaded with low dose digoxin to control heart rate. Consultants  . PCCM . CTS  Procedures  . Thorocentesis . VATS/Decortication  Antibiotics   Anti-infectives (From admission, onward)   Start     Dose/Rate Route Frequency Ordered Stop   04/16/20 1000  cefdinir (OMNICEF) capsule 600 mg        600 mg Oral Daily 04/14/20 1011 05/14/20 0959   04/06/20 1045  cefTRIAXone (ROCEPHIN) 2 g in sodium chloride 0.9 % 100 mL IVPB        2 g 200 mL/hr over 30 Minutes Intravenous Every 24 hours 04/06/20 0945 04/15/20 1147   04/05/20 1100  cefTRIAXone (ROCEPHIN) 2 g in sodium chloride 0.9 % 100 mL IVPB  Status:  Discontinued        2 g 200 mL/hr over 30 Minutes Intravenous Every 24 hours 04/05/20 1023 04/05/20 1453   04/03/20 1600  vancomycin (VANCOREADY) IVPB 750 mg/150 mL  Status:  Discontinued        750 mg 150 mL/hr over 60 Minutes Intravenous Every 12 hours 04/03/20 1500 04/05/20 1020   04/03/20 1000  remdesivir 100 mg in sodium chloride 0.9 % 100 mL IVPB  Status:  Discontinued       "Followed by" Linked Group Details   100 mg 200 mL/hr over 30 Minutes Intravenous Daily 04/02/20 0654 04/02/20 0656   04/03/20 1000  remdesivir 100  mg in sodium chloride 0.9 % 100 mL IVPB       "Followed by" Linked Group Details   100 mg 200 mL/hr over 30 Minutes Intravenous Daily 04/02/20 0658 04/06/20 0948   04/03/20 0400  vancomycin (VANCOREADY) IVPB 500 mg/100 mL  Status:  Discontinued        500 mg 100 mL/hr over 60 Minutes Intravenous Every 12 hours 04/02/20 1459 04/03/20 1500   04/02/20 2200  piperacillin-tazobactam (ZOSYN) IVPB 3.375 g  Status:  Discontinued        3.375 g 12.5 mL/hr over 240 Minutes Intravenous Every 8 hours 04/02/20 1332 04/05/20 1023   04/02/20 1515  vancomycin (VANCOREADY) IVPB 1250 mg/250 mL  Status:  Discontinued        1,250 mg 166.7 mL/hr over 90 Minutes Intravenous Every 24 hours 04/02/20 1449 04/02/20 1449   04/02/20 1515  vancomycin (VANCOREADY) IVPB 1250 mg/250 mL        1,250 mg 166.7 mL/hr over 90 Minutes Intravenous  Once 04/02/20 1449 04/02/20 1701   04/02/20 1345  piperacillin-tazobactam (ZOSYN) IVPB 3.375 g        3.375 g 100 mL/hr over 30 Minutes Intravenous  Once 04/02/20 1332 04/02/20 1434   04/02/20 0900  remdesivir 100 mg in sodium chloride 0.9 % 100 mL IVPB       "Followed by" Linked Group Details   100  mg 200 mL/hr over 30 Minutes Intravenous  Once 04/02/20 0658 04/02/20 1251   04/02/20 0800  remdesivir 100 mg in sodium chloride 0.9 % 100 mL IVPB       "Followed by" Linked Group Details   100 mg 200 mL/hr over 30 Minutes Intravenous  Once 04/02/20 0658 04/02/20 0853   04/02/20 0700  remdesivir 200 mg in sodium chloride 0.9% 250 mL IVPB  Status:  Discontinued       "Followed by" Linked Group Details   200 mg 580 mL/hr over 30 Minutes Intravenous Once 04/02/20 0654 04/02/20 0656    .   Subjective  The patient is lying in bed on her side. She is complaining bitterly of pain related to her chest tube.  Objective   Vitals:  Vitals:   04/15/20 1236 04/15/20 1519  BP: (!) 87/62 (!) 145/103  Pulse: (!) 106 (!) 109  Resp: 15 20  Temp: 98.6 F (37 C) 98.4 F (36.9 C)  SpO2:  98% 99%   Exam:  Constitutional:  The patient is awake, alert, and oriented x 3. Moderate distress from pain from chest tube. Respiratory:  . No increased work of breathing. . Diminished breath sounds at the left base. Marland Kitchen No wheezes, rales, or rhonchi . No tactile fremitus . Chest tube is to water seal. There is an air leak with cough. Cardiovascular:  . Regular rate and rhythm . No murmurs, ectopy, or gallups. . No lateral PMI. No thrills. Abdomen:  . Abdomen is soft, non-tender, non-distended . No hernias, masses, or organomegaly . Normoactive bowel sounds.  Musculoskeletal:  . No cyanosis, clubbing, or edema Skin:  . No rashes, lesions, ulcers . palpation of skin: no induration or nodules Neurologic:  . CN 2-12 intact . Sensation all 4 extremities intact Psychiatric:  . Mental status o Mood, affect appropriate o Orientation to person, place, time  . judgment and insight appear intact  I have personally reviewed the following:   Today's Data  . Vitals, CMP, Procalcitonin, CRP, CBC  Micro Data  . MRSA by PCR is negative. . Blood culture x 2: No growth . Body fluid culture: streptococcus pneumoniae  Imaging  . CTA chest . CXR  Cardiology Data  . Echocardiogram  Scheduled Meds: . acidophilus  2 capsule Oral Daily  . albuterol  2 puff Inhalation BID  . bisacodyl  5 mg Oral Daily  . busPIRone  10 mg Oral TID  . [START ON 04/16/2020] cefdinir  600 mg Oral Daily  . Chlorhexidine Gluconate Cloth  6 each Topical Daily  . dextromethorphan-guaiFENesin  1 tablet Oral BID  . digoxin  0.125 mg Intravenous TID  . enoxaparin (LOVENOX) injection  40 mg Subcutaneous Q24H  . feeding supplement  237 mL Oral TID BM  . fentaNYL (SUBLIMAZE) injection  12.5 mcg Intravenous Once  . insulin aspart  0-5 Units Subcutaneous QHS  . insulin aspart  0-9 Units Subcutaneous TID WC  . lidocaine  1 patch Transdermal Q24H  . metoprolol tartrate  12.5 mg Oral BID  . multivitamin with  minerals  1 tablet Oral Daily  . nicotine  14 mg Transdermal Daily  . pantoprazole  40 mg Oral Daily  . senna-docusate  2 tablet Oral BID  . traZODone  100 mg Oral QHS   Continuous Infusions: . sodium chloride    . 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  Pleural fluid culture positive for Streptococcus pneumoniae-thankfully blood cultures negative.  Chest tube in place-CT surgery following.  Recommendations from [facility] are for 2 weeks of IV Rocephin from 12/20-followed by 4 weeks of oral antimicrobial therapy.  Given tricuspid regurgitation-see below-we will get ID opinion as well.  Per CT surgery-due to persistent air leak-May require another thoracic procedure. The patient is now saturating 99% on room air.  COVID-19 infection: Has been treated with steroid/Remdesivir. Hypoxia was mostly from empyema-rather than COVID-19 related parenchymal infection. As the patient is clinically  Improved. The patient has been taken out of isolation and moved out of 5W.  Tricuspid valve regurgitation: Given history of IVDA, empyema, and concern for endocarditis, although blood cultures were negative, will discuss with ID. I believe that the patient is being adequately treated in any event.  Pericardial effusion: Seen on echo on 12/18, but resolved on echo on 12/28.    Depression/anorexia: Continue BuSpar  History of IVDA-heroin/methamphetamine: Noted.  Tobacco abuse: Continue transdermal nicotine  Severe Protein Calorie  Malnutrition: Etiology: acute illness (COVID 19 infection)/IVDA. Signs/Symptoms: moderate fat depletion,severe muscle depletion,energy intake < or equal to 50% for > or equal to 5 days. Interventions: Ensure Enlive (each supplement provides 350kcal and 20 grams of protein),Magic cup.  I have seen and examined this patient myself. I have spent 36 minutes in her evaluation and care.  DVT prophylaxis: Lovenox CODE STATUS: Full Code Family Communication: None available Disposition: Status is: Inpatient  Remains inpatient appropriate because:IV treatments appropriate due to intensity of illness or inability to take PO   Dispo: The patient is from: Home              Anticipated d/c is to: Home              Anticipated d/c date is: > 3 days              Patient currently is not medically stable to d/c.   Fran Lowes, DO [facility] 04/15/2020 3:46 PM

## 2020-04-15 NOTE — Progress Notes (Signed)
      [address]       [place] [address]             [phone]       10 Days Post-Op Procedure(s) (LRB): VIDEO ASSISTED THORACOSCOPY (VATS)/EMPYEMA DRAINAGE, DECORTICATION  (Left)  Subjective: Patient continues with pain at chest tube sites  Objective: Vital signs in last 24 hours: Temp:  [98.3 F (36.8 C)-98.4 F (36.9 C)] 98.3 F (36.8 C) (12/30 0500) Pulse Rate:  [100-118] 100 (12/30 0500) Cardiac Rhythm: Sinus tachycardia (12/29 1900) Resp:  [17-21] 18 (12/30 0501) BP: (82-101)/(66-74) 90/68 (12/30 0500) SpO2:  [94 %-98 %] 98 % (12/30 0500)      Intake/Output from previous day: 12/29 0701 - 12/30 0700 In: 240 [P.O.:240] Out: -    Physical Exam:  Cardiovascular: RRR Pulmonary: Clear to auscultation on the right;diminshed left base. Extremities:No lower extremity edema. Wounds: Dressing is wound lean and dry.   Chest Tubes:to water seal, +++ air leak with cough  Lab Results: CBC: Recent Labs    04/13/20 0154  WBC 10.2  HGB 9.6*  HCT 29.2*  PLT 474*   BMET:  Recent Labs    04/13/20 0154  NA 139  K 4.2  CL 105  CO2 25  GLUCOSE 97  BUN 17  CREATININE 0.65  CALCIUM 8.3*    PT/INR: No results for input(s): LABPROT, INR in the last 72 hours. ABG:  INR: Will add last result for INR, ABG once components are confirmed Will add last 4 CBG results once components are confirmed  Assessment/Plan:  1. CV - SR/ST with HR in the 100's this am. On Lopressor 12.5 mg bid. Per primary 2.  Pulmonary - On room air. Chest tube with no output recorded last 24 hours. Chest tube is to water seal and has a +++ air leak with cough. CXR appears to show right, moderate left space-possibly increased. Chest tubes to remain for now. She likely will need another thoracic surgery. Encourage incentive spirometer. 3. CBGs 100/81/117. On Insulin PRN. Per primary 4. Anemia-H and H this am 10.2 and 30.3 5. ID-on Ceftriaxone 2 grams IV daily. Given recent  findings of TR and per infectious disease, will transition to oral Cefdinir for 4 weeks for a total of 6 weeks of antibiotic therapy.  Joeanna Howdyshell M ZimmermanPA-C 04/15/2020,7:28 AM [phone]

## 2020-04-16 ENCOUNTER — Inpatient Hospital Stay (HOSPITAL_COMMUNITY): Payer: Medicaid Other

## 2020-04-16 DIAGNOSIS — J869 Pyothorax without fistula: Secondary | ICD-10-CM | POA: Diagnosis not present

## 2020-04-16 LAB — GLUCOSE, CAPILLARY
Glucose-Capillary: 125 mg/dL — ABNORMAL HIGH (ref 70–99)
Glucose-Capillary: 126 mg/dL — ABNORMAL HIGH (ref 70–99)
Glucose-Capillary: 160 mg/dL — ABNORMAL HIGH (ref 70–99)
Glucose-Capillary: 106 mg/dL — ABNORMAL HIGH (ref 70–99)

## 2020-04-16 MED ORDER — HYDROMORPHONE HCL 1 MG/ML IJ SOLN
0.5000 mg | INTRAMUSCULAR | Status: DC | PRN
  Administered 2020-04-16 – 2020-04-19 (×21): 0.5 mg via INTRAVENOUS
  Filled 2020-04-16 (×21): qty 1

## 2020-04-16 MED ORDER — BOOST / RESOURCE BREEZE PO LIQD CUSTOM
1.0000 | Freq: Two times a day (BID) | ORAL | Status: DC
  Administered 2020-04-17 – 2020-04-20 (×6): 1 via ORAL

## 2020-04-16 NOTE — Progress Notes (Signed)
PROGRESS NOTE  Alyssa Vargas NTI:[id number] DOB: December 13, 1985 DOA: 04/01/2020 PCP: Patient, No Pcp Per  Brief History   Patient is a 34 y.o. female with PMHx of IVDA (heroin/methamphetamine)-who tested positive for COVID-19 on 12/11 (home test)-presented to [facility] on 12/16 with dyspnea, cough-found to have a large left-sided pleural effusion-underwent diagnostic thoracocentesis on 12/17-which was consistent with empyema-subsequently seen by CT surgery-and underwent VATS/decortication on 12/20.    The patient's chest tube is still in place. She continues to have significant pain from the chest tube. Narcotic pain control is limited due to the patient's hypotension. Heart rate has also been high. It is sinus tachycardia. Likely at least in part due to pain.  Consultants  . PCCM . CTS  Procedures  . Thorocentesis . VATS/Decortication  Antibiotics   Anti-infectives (From admission, onward)   Start     Dose/Rate Route Frequency Ordered Stop   04/16/20 1000  cefdinir (OMNICEF) capsule 600 mg        600 mg Oral Daily 04/14/20 1011 05/14/20 0959   04/06/20 1045  cefTRIAXone (ROCEPHIN) 2 g in sodium chloride 0.9 % 100 mL IVPB        2 g 200 mL/hr over 30 Minutes Intravenous Every 24 hours 04/06/20 0945 04/15/20 1726   04/05/20 1100  cefTRIAXone (ROCEPHIN) 2 g in sodium chloride 0.9 % 100 mL IVPB  Status:  Discontinued        2 g 200 mL/hr over 30 Minutes Intravenous Every 24 hours 04/05/20 1023 04/05/20 1453   04/03/20 1600  vancomycin (VANCOREADY) IVPB 750 mg/150 mL  Status:  Discontinued        750 mg 150 mL/hr over 60 Minutes Intravenous Every 12 hours 04/03/20 1500 04/05/20 1020   04/03/20 1000  remdesivir 100 mg in sodium chloride 0.9 % 100 mL IVPB  Status:  Discontinued       "Followed by" Linked Group Details   100 mg 200 mL/hr over 30 Minutes Intravenous Daily 04/02/20 0654 04/02/20 0656   04/03/20 1000  remdesivir 100 mg in sodium chloride 0.9 % 100 mL IVPB       "Followed by" Linked  Group Details   100 mg 200 mL/hr over 30 Minutes Intravenous Daily 04/02/20 0658 04/06/20 0948   04/03/20 0400  vancomycin (VANCOREADY) IVPB 500 mg/100 mL  Status:  Discontinued        500 mg 100 mL/hr over 60 Minutes Intravenous Every 12 hours 04/02/20 1459 04/03/20 1500   04/02/20 2200  piperacillin-tazobactam (ZOSYN) IVPB 3.375 g  Status:  Discontinued        3.375 g 12.5 mL/hr over 240 Minutes Intravenous Every 8 hours 04/02/20 1332 04/05/20 1023   04/02/20 1515  vancomycin (VANCOREADY) IVPB 1250 mg/250 mL  Status:  Discontinued        1,250 mg 166.7 mL/hr over 90 Minutes Intravenous Every 24 hours 04/02/20 1449 04/02/20 1449   04/02/20 1515  vancomycin (VANCOREADY) IVPB 1250 mg/250 mL        1,250 mg 166.7 mL/hr over 90 Minutes Intravenous  Once 04/02/20 1449 04/02/20 1701   04/02/20 1345  piperacillin-tazobactam (ZOSYN) IVPB 3.375 g        3.375 g 100 mL/hr over 30 Minutes Intravenous  Once 04/02/20 1332 04/02/20 1434   04/02/20 0900  remdesivir 100 mg in sodium chloride 0.9 % 100 mL IVPB       "Followed by" Linked Group Details   100 mg 200 mL/hr over 30 Minutes Intravenous  Once 04/02/20 0658 04/02/20  1251   04/02/20 0800  remdesivir 100 mg in sodium chloride 0.9 % 100 mL IVPB       "Followed by" Linked Group Details   100 mg 200 mL/hr over 30 Minutes Intravenous  Once 04/02/20 0658 04/02/20 0853   04/02/20 0700  remdesivir 200 mg in sodium chloride 0.9% 250 mL IVPB  Status:  Discontinued       "Followed by" Linked Group Details   200 mg 580 mL/hr over 30 Minutes Intravenous Once 04/02/20 0654 04/02/20 0656      Subjective  The patient is resting comfortably in bed. She is complaining of FSBS.  Objective   Vitals:  Vitals:   04/16/20 0856 04/16/20 1528  BP:  (!) 91/59  Pulse:  (!) 102  Resp:    Temp:  98.5 F (36.9 C)  SpO2: 98% 99%   Exam:  Constitutional:  The patient is awake, alert, and oriented x 3. No acute distress. Respiratory:  . No increased work  of breathing. . Diminished breath sounds at the left base. Marland Kitchen No wheezes, rales, or rhonchi . No tactile fremitus . Chest tube is to water seal. There is an air leak with cough. Cardiovascular:  . Regular rate and rhythm . No murmurs, ectopy, or gallups. . No lateral PMI. No thrills. Abdomen:  . Abdomen is soft, non-tender, non-distended . No hernias, masses, or organomegaly . Normoactive bowel sounds.  Musculoskeletal:  . No cyanosis, clubbing, or edema Skin:  . No rashes, lesions, ulcers . palpation of skin: no induration or nodules Neurologic:  . CN 2-12 intact . Sensation all 4 extremities intact Psychiatric:  . Mental status o Mood, affect appropriate o Orientation to person, place, time  . judgment and insight appear intact  I have personally reviewed the following:   Today's Data  . Vitals, CMP, Procalcitonin, CRP, CBC  Micro Data  . MRSA by PCR is negative. . Blood culture x 2: No growth . Body fluid culture: streptococcus pneumoniae . COVID-19 positive on 03/27/2020 and 04/01/2020. No isolation necessary  Imaging  . CTA chest . CXR  Cardiology Data  . Echocardiogram  Scheduled Meds: . acidophilus  2 capsule Oral Daily  . albuterol  2 puff Inhalation BID  . bisacodyl  5 mg Oral Daily  . busPIRone  10 mg Oral TID  . cefdinir  600 mg Oral Daily  . Chlorhexidine Gluconate Cloth  6 each Topical Daily  . dextromethorphan-guaiFENesin  1 tablet Oral BID  . enoxaparin (LOVENOX) injection  40 mg Subcutaneous Q24H  . feeding supplement  1 Container Oral BID BM  . feeding supplement  237 mL Oral TID BM  . fentaNYL (SUBLIMAZE) injection  12.5 mcg Intravenous Once  . insulin aspart  0-5 Units Subcutaneous QHS  . insulin aspart  0-9 Units Subcutaneous TID WC  . lidocaine  1 patch Transdermal Q24H  . metoprolol tartrate  12.5 mg Oral BID  . multivitamin with minerals  1 tablet Oral Daily  . nicotine  14 mg Transdermal Daily  . pantoprazole  40 mg Oral Daily  .  senna-docusate  2 tablet Oral BID  . traZODone  100 mg Oral QHS   Continuous Infusions: . sodium chloride    . 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  Pleural fluid culture positive for Streptococcus pneumoniae-thankfully blood cultures negative.  Chest tube in place-CT surgery following.  Recommendations from [facility]VS are for 2 weeks of IV Rocephin from 12/20-followed by 4 weeks of oral antimicrobial therapy.  Given tricuspid regurgitation-see below-we will get ID opinion as well.  Per CT surgery-due to persistent air leak. The patient may require another thoracic procedure. The patient is now saturating 99% on room air.  COVID-19 infection: The patient initially tested positive at home on 03/27/2020 after having symptoms x 1 day. Has been treated with steroid/Remdesivir. Hypoxia was mostly from empyema-rather than COVID-19 related parenchymal infection. As the patient is clinically  Improved. The patient has been taken out of isolation and moved out of 5W.  Tricuspid valve regurgitation: Given history of IVDA, empyema, and concern for endocarditis, although blood cultures were negative, will discuss with ID. I believe that the patient is being adequately treated in any event.  Pericardial effusion: Seen on echo on 12/18, but resolved on echo on 12/28.    Depression/anorexia: Continue BuSpar as at home.  History of IVDA-heroin/methamphetamine: Noted.  Tobacco abuse: Continue transdermal nicotine.  Severe  Protein Calorie Malnutrition: Etiology: acute illness (COVID 19 infection)/IVDA. Signs/Symptoms: moderate fat depletion,severe muscle depletion,energy intake < or equal to 50% for > or equal to 5 days. Interventions: Ensure Enlive (each supplement provides 350kcal and 20 grams of protein),Magic cup.  I have seen and examined this patient myself. I have spent 34 minutes in her evaluation and care.  DVT prophylaxis: Lovenox CODE STATUS: Full Code Family Communication: None available Disposition: Status is: Inpatient  Remains inpatient appropriate because:IV treatments appropriate due to intensity of illness or inability to take PO  Dispo: The patient is from: Home              Anticipated d/c is to: Home              Anticipated d/c date is: > 3 days              Patient currently is not medically stable to d/c.   Fran Lowes, DO [facility] 04/16/2020 3:50 PM

## 2020-04-16 NOTE — Progress Notes (Signed)
Initial Nutrition Assessment  DOCUMENTATION CODES:   Severe malnutrition in context of acute illness/injury,Underweight  INTERVENTION:  Continue Ensure Enlive po TID, each supplement provides 350 kcal and 20 grams of protein  Provide Boost Breeze po BID, each supplement provides 250 kcal and 9 grams of protein.  Provide Magic cup TID with meals, each supplement provides 290 kcal and 9 grams of protein  Encourage adequate PO intake.   NUTRITION DIAGNOSIS:   Severe Malnutrition related to acute illness (COVID 19 infection) as evidenced by moderate fat depletion,severe muscle depletion,energy intake < or equal to 50% for > or equal to 5 days; ongoing  GOAL:   Patient will meet greater than or equal to 90% of their needs; progressing  MONITOR:   Supplement acceptance,Weight trends,PO intake,Labs,I & O's  REASON FOR ASSESSMENT:   Malnutrition Screening Tool    ASSESSMENT:   34 y.o. female with PMHx of IVDA (heroin/methamphetamine)-positive for COVID-19 on 12/11-presents with dyspnea, cough-found to have a large left-sided pleural effusion-underwent diagnostic thoracocentesis on 12/17-which was consistent with empyema-subsequently seen by CT surgery-and underwent VATS/decortication on 12/20. 12/18- s/pLchest tube placement   Per MD, chest tubes to remain in place with likely need for another thoracic surgery. Output 140 ml. Meal completion has been 75-100%. Pt tolerating her PO diet. Pt currently has Ensure ordered and has been consuming them. RD consulted for additional supplement ordering as pt with borderline low to normal finger stick blood glucose with avoidance of blood glucose in the 70's. RD to additionally order Boost Breeze to aid in prevention of low blood glucose. Pt encouraged to eat her food at meals and to drink her supplements.   Labs and medications reviewed.   Diet Order:   Diet Order            Diet regular Room service appropriate? Yes; Fluid consistency:  Thin  Diet effective now                 EDUCATION NEEDS:   Education needs have been addressed  Skin:  Skin Assessment: Reviewed RN Assessment Skin Integrity Issues:: Incisions Incisions: L chest  Last BM:  12/30  Height:   Ht Readings from Last 1 Encounters:  04/02/20 5\' 7"  (1.702 m)    Weight:   Wt Readings from Last 1 Encounters:  04/02/20 50.2 kg    Ideal Body Weight:  61.4 kg  BMI:  Body mass index is 17.33 kg/m.  Estimated Nutritional Needs:   Kcal:  2000-2200  Protein:  110-125 grams  Fluid:  > 2 L  04/04/20, MS, RD, LDN RD pager number/after hours weekend pager number on Amion.

## 2020-04-16 NOTE — Progress Notes (Addendum)
      [address]       [place] [address]             [phone]       11 Days Post-Op Procedure(s) (LRB): VIDEO ASSISTED THORACOSCOPY (VATS)/EMPYEMA DRAINAGE, DECORTICATION  (Left)  Subjective: Patient has no breathing complaints but continues with pain at chest tube sites  Objective: Vital signs in last 24 hours: Temp:  [98 F (36.7 C)-98.8 F (37.1 C)] 98 F (36.7 C) (12/31 0603) Pulse Rate:  [99-109] 99 (12/31 0603) Cardiac Rhythm: Sinus tachycardia (12/31 0603) Resp:  [15-28] 18 (12/31 0603) BP: (87-145)/(62-103) 89/68 (12/31 0603) SpO2:  [94 %-99 %] 99 % (12/30 2000)      Intake/Output from previous day: 12/30 0701 - 12/31 0700 In: 1150 [P.O.:850; IV Piggyback:300] Out: 140 [Chest Tube:140]   Physical Exam:  Cardiovascular: RRR Pulmonary: Clear to auscultation on the right;augible squeak on the left Extremities:No lower extremity edema. Wounds: Dressing is wound lean and dry.   Chest Tubes:to water seal, +++ air leak with cough  Lab Results: CBC: No results for input(s): WBC, HGB, HCT, PLT in the last 72 hours. BMET:  No results for input(s): NA, K, CL, CO2, GLUCOSE, BUN, CREATININE, CALCIUM in the last 72 hours.  PT/INR: No results for input(s): LABPROT, INR in the last 72 hours. ABG:  INR: Will add last result for INR, ABG once components are confirmed Will add last 4 CBG results once components are confirmed  Assessment/Plan:  1. CV - SR/ST with HR in the 100's this am. On Lopressor 12.5 mg bid. Per primary 2.  Pulmonary - On room air. Chest tubes with 140 ccrecorded last 24 hours. Chest tube is to water seal and has a +++ air leak with cough.  Chest tubes to remain for now. This am's CXR appears stable (moderate, residual left pneumothorax). She likely will need another thoracic surgery. Encourage incentive spirometer. 3. CBGs 90/88/125. On Insulin PRN. Per primary 4. Anemia-H and H this am 10.2 and 30.3 5. ID-on Ceftriaxone 2  grams IV daily. Given recent findings of TR and per infectious disease, will transition to oral Cefdinir for 4 weeks for a total of 6 weeks of antibiotic therapy.  Shanyla Marconi M ZimmermanPA-C 04/16/2020,8:01 AM [phone]

## 2020-04-16 NOTE — Plan of Care (Signed)
  Problem: Education: Goal: Knowledge of General Education information will improve Description: Including pain rating scale, medication(s)/side effects and non-pharmacologic comfort measures Outcome: Progressing   Problem: Health Behavior/Discharge Planning: Goal: Ability to manage health-related needs will improve Outcome: Progressing   Problem: Clinical Measurements: Goal: Ability to maintain clinical measurements within normal limits will improve Outcome: Progressing Goal: Will remain free from infection Outcome: Progressing Goal: Diagnostic test results will improve Outcome: Progressing Goal: Cardiovascular complication will be avoided Outcome: Progressing   Problem: Activity: Goal: Risk for activity intolerance will decrease Outcome: Progressing   Problem: Nutrition: Goal: Adequate nutrition will be maintained Outcome: Progressing   Problem: Coping: Goal: Level of anxiety will decrease Outcome: Progressing   Problem: Pain Managment: Goal: General experience of comfort will improve Outcome: Progressing   Problem: Safety: Goal: Ability to remain free from injury will improve Outcome: Progressing   Problem: Education: Goal: Knowledge of risk factors and measures for prevention of condition will improve Outcome: Progressing   Problem: Coping: Goal: Psychosocial and spiritual needs will be supported Outcome: Progressing   Problem: Respiratory: Goal: Will maintain a patent airway Outcome: Progressing Goal: Complications related to the disease process, condition or treatment will be avoided or minimized Outcome: Progressing

## 2020-04-17 ENCOUNTER — Inpatient Hospital Stay (HOSPITAL_COMMUNITY): Payer: Medicaid Other

## 2020-04-17 LAB — BASIC METABOLIC PANEL
Anion gap: 10 (ref 5–15)
BUN: 14 mg/dL (ref 6–20)
CO2: 26 mmol/L (ref 22–32)
Calcium: 8.8 mg/dL — ABNORMAL LOW (ref 8.9–10.3)
Chloride: 101 mmol/L (ref 98–111)
Creatinine, Ser: 0.62 mg/dL (ref 0.44–1.00)
GFR, Estimated: >60 mL/min (ref >60)
Glucose, Bld: 124 mg/dL — ABNORMAL HIGH (ref 70–99)
Potassium: 3.8 mmol/L (ref 3.5–5.1)
Sodium: 137 mmol/L (ref 135–145)

## 2020-04-17 LAB — CBC WITH DIFFERENTIAL/PLATELET
Abs Immature Granulocytes: 0.04 10*3/uL (ref 0.00–0.07)
Basophils Absolute: 0 10*3/uL (ref 0.0–0.1)
Basophils Relative: 0 %
Eosinophils Absolute: 0.1 10*3/uL (ref 0.0–0.5)
Eosinophils Relative: 1 %
HCT: 29.5 % — ABNORMAL LOW (ref 36.0–46.0)
Hemoglobin: 9.4 g/dL — ABNORMAL LOW (ref 12.0–15.0)
Immature Granulocytes: 1 %
Lymphocytes Relative: 27 %
Lymphs Abs: 1.9 10*3/uL (ref 0.7–4.0)
MCH: 27.9 pg (ref 26.0–34.0)
MCHC: 31.9 g/dL (ref 30.0–36.0)
MCV: 87.5 fL (ref 80.0–100.0)
Monocytes Absolute: 0.6 10*3/uL (ref 0.1–1.0)
Monocytes Relative: 8 %
Neutro Abs: 4.4 10*3/uL (ref 1.7–7.7)
Neutrophils Relative %: 63 %
Platelets: 441 10*3/uL — ABNORMAL HIGH (ref 150–400)
RBC: 3.37 MIL/uL — ABNORMAL LOW (ref 3.87–5.11)
RDW: 15.7 % — ABNORMAL HIGH (ref 11.5–15.5)
WBC: 7 10*3/uL (ref 4.0–10.5)
nRBC: 0 % (ref 0.0–0.2)

## 2020-04-17 LAB — GLUCOSE, CAPILLARY
Glucose-Capillary: 141 mg/dL — ABNORMAL HIGH (ref 70–99)
Glucose-Capillary: 105 mg/dL — ABNORMAL HIGH (ref 70–99)
Glucose-Capillary: 116 mg/dL — ABNORMAL HIGH (ref 70–99)
Glucose-Capillary: 98 mg/dL (ref 70–99)

## 2020-04-17 LAB — DIGOXIN LEVEL: Digoxin Level: 0.3 ng/mL — ABNORMAL LOW (ref 0.8–2.0)

## 2020-04-17 MED ORDER — SODIUM CHLORIDE 0.9 % IV BOLUS
500.0000 mL | Freq: Once | INTRAVENOUS | Status: AC
  Administered 2020-04-17: 500 mL via INTRAVENOUS

## 2020-04-17 NOTE — Plan of Care (Signed)
  Problem: Education: Goal: Knowledge of General Education information will improve Description: Including pain rating scale, medication(s)/side effects and non-pharmacologic comfort measures Outcome: Progressing   Problem: Health Behavior/Discharge Planning: Goal: Ability to manage health-related needs will improve Outcome: Progressing   Problem: Clinical Measurements: Goal: Ability to maintain clinical measurements within normal limits will improve Outcome: Progressing Goal: Will remain free from infection Outcome: Progressing Goal: Diagnostic test results will improve Outcome: Progressing Goal: Cardiovascular complication will be avoided Outcome: Progressing   Problem: Activity: Goal: Risk for activity intolerance will decrease Outcome: Progressing   Problem: Nutrition: Goal: Adequate nutrition will be maintained Outcome: Progressing   Problem: Coping: Goal: Level of anxiety will decrease Outcome: Progressing   Problem: Pain Managment: Goal: General experience of comfort will improve Outcome: Progressing   Problem: Safety: Goal: Ability to remain free from injury will improve Outcome: Progressing   Problem: Education: Goal: Knowledge of risk factors and measures for prevention of condition will improve Outcome: Progressing   Problem: Coping: Goal: Psychosocial and spiritual needs will be supported Outcome: Progressing   Problem: Respiratory: Goal: Will maintain a patent airway Outcome: Progressing Goal: Complications related to the disease process, condition or treatment will be avoided or minimized Outcome: Progressing   

## 2020-04-17 NOTE — Progress Notes (Addendum)
PROGRESS NOTE  Llewellyn Schoenberger OEU:[id number] DOB: [birth year] DOA: 04/01/2020 PCP: Patient, No Pcp Per  Brief History   Patient is a 35 y.o. female with PMHx of IVDA (heroin/methamphetamine)-who tested positive for COVID-19 on 12/11 (home test)-presented to [facility] on 12/16 with dyspnea, cough-found to have a large left-sided pleural effusion-underwent diagnostic thoracocentesis on 12/17-which was consistent with empyema-subsequently seen by CT surgery-and underwent VATS/decortication on 12/20.    The patient's chest tube is still in place. She continues to have significant pain from the chest tube. Narcotic pain control is limited due to the patient's hypotension. Heart rate has also been high. It is sinus tachycardia. Likely at least in part due to pain.  ID has transitioned the patient from IV ceftriaxine and vancomycin to PO Cefdinir for the 4 weeks remaining in her 6 week course of antibiotics.  Per CTS the patient will likely require another thoracic procedure.  Consultants  . PCCM . CTS . ID  Procedures  . Thorocentesis . VATS/Decortication  Antibiotics   Anti-infectives (From admission, onward)   Start     Dose/Rate Route Frequency Ordered Stop   04/16/20 1000  cefdinir (OMNICEF) capsule 600 mg        600 mg Oral Daily 04/14/20 1011 05/14/20 0959   04/06/20 1045  cefTRIAXone (ROCEPHIN) 2 g in sodium chloride 0.9 % 100 mL IVPB        2 g 200 mL/hr over 30 Minutes Intravenous Every 24 hours 04/06/20 0945 04/15/20 1726   04/05/20 1100  cefTRIAXone (ROCEPHIN) 2 g in sodium chloride 0.9 % 100 mL IVPB  Status:  Discontinued        2 g 200 mL/hr over 30 Minutes Intravenous Every 24 hours 04/05/20 1023 04/05/20 1453   04/03/20 1600  vancomycin (VANCOREADY) IVPB 750 mg/150 mL  Status:  Discontinued        750 mg 150 mL/hr over 60 Minutes Intravenous Every 12 hours 04/03/20 1500 04/05/20 1020   04/03/20 1000  remdesivir 100 mg in sodium chloride 0.9 % 100 mL IVPB  Status:  Discontinued        "Followed by" Linked Group Details   100 mg 200 mL/hr over 30 Minutes Intravenous Daily 04/02/20 0654 04/02/20 0656   04/03/20 1000  remdesivir 100 mg in sodium chloride 0.9 % 100 mL IVPB       "Followed by" Linked Group Details   100 mg 200 mL/hr over 30 Minutes Intravenous Daily 04/02/20 0658 04/06/20 0948   04/03/20 0400  vancomycin (VANCOREADY) IVPB 500 mg/100 mL  Status:  Discontinued        500 mg 100 mL/hr over 60 Minutes Intravenous Every 12 hours 04/02/20 1459 04/03/20 1500   04/02/20 2200  piperacillin-tazobactam (ZOSYN) IVPB 3.375 g  Status:  Discontinued        3.375 g 12.5 mL/hr over 240 Minutes Intravenous Every 8 hours 04/02/20 1332 04/05/20 1023   04/02/20 1515  vancomycin (VANCOREADY) IVPB 1250 mg/250 mL  Status:  Discontinued        1,250 mg 166.7 mL/hr over 90 Minutes Intravenous Every 24 hours 04/02/20 1449 04/02/20 1449   04/02/20 1515  vancomycin (VANCOREADY) IVPB 1250 mg/250 mL        1,250 mg 166.7 mL/hr over 90 Minutes Intravenous  Once 04/02/20 1449 04/02/20 1701   04/02/20 1345  piperacillin-tazobactam (ZOSYN) IVPB 3.375 g        3.375 g 100 mL/hr over 30 Minutes Intravenous  Once 04/02/20 1332 04/02/20 1434   04/02/20  0900  remdesivir 100 mg in sodium chloride 0.9 % 100 mL IVPB       "Followed by" Linked Group Details   100 mg 200 mL/hr over 30 Minutes Intravenous  Once 04/02/20 0658 04/02/20 1251   04/02/20 0800  remdesivir 100 mg in sodium chloride 0.9 % 100 mL IVPB       "Followed by" Linked Group Details   100 mg 200 mL/hr over 30 Minutes Intravenous  Once 04/02/20 0658 04/02/20 0853   04/02/20 0700  remdesivir 200 mg in sodium chloride 0.9% 250 mL IVPB  Status:  Discontinued       "Followed by" Linked Group Details   200 mg 580 mL/hr over 30 Minutes Intravenous Once 04/02/20 0654 04/02/20 0656      Subjective  The patient is resting comfortably in bed. She is complaining of FSBS.  Objective   Vitals:  Vitals:   04/17/20 1100 04/17/20  1212  BP: 92/60 106/73  Pulse: 100 90  Resp: (!) 21   Temp:  98.1 F (36.7 C)  SpO2:  100%   Exam:  Constitutional:  The patient is awake, alert, and oriented x 3. No acute distress. Respiratory:  . No increased work of breathing. . No wheezes, rales, or rhonchi . No tactile fremitus . Chest tube is to water seal. There is an air leak with cough. Cardiovascular:  . Regular rate and rhythm . No murmurs, ectopy, or gallups. . No lateral PMI. No thrills. Abdomen:  . Abdomen is soft, non-tender, non-distended . No hernias, masses, or organomegaly . Normoactive bowel sounds.  Musculoskeletal:  . No cyanosis, clubbing, or edema Skin:  . No rashes, lesions, ulcers . palpation of skin: no induration or nodules Neurologic:  . CN 2-12 intact . Sensation all 4 extremities intact Psychiatric:  . Mental status o Mood, affect appropriate o Orientation to person, place, time  . judgment and insight appear intact  I have personally reviewed the following:   Today's Data  . Vitals, CMP, Procalcitonin, CRP, CBC  Micro Data  . MRSA by PCR is negative. . Blood culture x 2: No growth . Body fluid culture: streptococcus pneumoniae . COVID-19 positive on 03/27/2020 and 04/01/2020. No isolation necessary  Imaging  . CTA chest . CXR  Cardiology Data  . Echocardiogram  Scheduled Meds: . acidophilus  2 capsule Oral Daily  . albuterol  2 puff Inhalation BID  . bisacodyl  5 mg Oral Daily  . busPIRone  10 mg Oral TID  . cefdinir  600 mg Oral Daily  . Chlorhexidine Gluconate Cloth  6 each Topical Daily  . dextromethorphan-guaiFENesin  1 tablet Oral BID  . enoxaparin (LOVENOX) injection  40 mg Subcutaneous Q24H  . feeding supplement  1 Container Oral BID BM  . feeding supplement  237 mL Oral TID BM  . fentaNYL (SUBLIMAZE) injection  12.5 mcg Intravenous Once  . insulin aspart  0-5 Units Subcutaneous QHS  . insulin aspart  0-9 Units Subcutaneous TID WC  . lidocaine  1 patch  Transdermal Q24H  . metoprolol tartrate  12.5 mg Oral BID  . multivitamin with minerals  1 tablet Oral Daily  . nicotine  14 mg Transdermal Daily  . pantoprazole  40 mg Oral Daily  . senna-docusate  2 tablet Oral BID  . traZODone  100 mg Oral QHS   Continuous Infusions: . sodium chloride    . 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  Pleural fluid culture positive for Streptococcus pneumoniae-thankfully blood cultures negative.  Chest tube in place-CT surgery following.  Recommendations from [facility] are for 2 weeks of IV Rocephin from 12/20-followed by 4 weeks of oral antimicrobial therapy.  Given tricuspid regurgitation-see below-we will get ID opinion as well.  Per CT surgery-due to persistent air leak. The patient may require another thoracic procedure. The patient is now saturating 99% on room air. ID has transitioned the patient from IV ceftriaxine and vancomycin to PO Cefdinir for the 4 weeks remaining in her 6 week course of antibiotics.  COVID-19 infection: The patient initially tested positive at home on 03/27/2020 after having symptoms x 1 day. Has been treated with steroid/Remdesivir. Hypoxia was mostly from empyema-rather than COVID-19 related parenchymal infection. As the patient is clinically  Improved. The patient has been taken out of isolation and moved out of 5W.  Tricuspid valve regurgitation: The patient has received 2 weeks of IV ceftriaxone and vancomycin. ID has now converted her to PO cefdinir to  complete the next 4 weeks of therapy.  Pericardial effusion: Seen on echo on 12/18, but resolved on echo on 12/28.    Depression/anorexia: Continue BuSpar as at home.  History of IVDA-heroin/methamphetamine: Noted.  Tobacco abuse: Continue transdermal nicotine.  Severe Protein Calorie Malnutrition: Etiology: acute illness (COVID 19 infection)/IVDA. Signs/Symptoms: moderate fat depletion,severe muscle depletion,energy intake < or equal to 50% for > or equal to 5 days. Interventions: Ensure Enlive (each supplement provides 350kcal and 20 grams of protein),Magic cup.  I have seen and examined this patient myself. I have spent 32 minutes in her evaluation and care.  DVT prophylaxis: Lovenox CODE STATUS: Full Code Family Communication: None available Disposition: Status is: Inpatient  Remains inpatient appropriate because:IV treatments appropriate due to intensity of illness or inability to take PO  Dispo: The patient is from: Home              Anticipated d/c is to: Home              Anticipated d/c date is: > 3 days              Patient currently is not medically stable to d/c.   Karie Kirks, DO [facility] 04/17/2020 1:26 PM

## 2020-04-17 NOTE — Progress Notes (Addendum)
      [address]       [place] [address]             [phone]       12 Days Post-Op Procedure(s) (LRB): VIDEO ASSISTED THORACOSCOPY (VATS)/EMPYEMA DRAINAGE, DECORTICATION  (Left)  Subjective: Patient eating cereal this am. She asked if leak is still present.  Objective: Vital signs in last 24 hours: Temp:  [97.8 F (36.6 C)-99.6 F (37.6 C)] 97.8 F (36.6 C) (01/01 0538) Pulse Rate:  [98-106] 98 (01/01 0819) Cardiac Rhythm: Sinus tachycardia (01/01 0700) Resp:  [17-20] 17 (01/01 0819) BP: (83-91)/(55-59) 88/56 (01/01 0819) SpO2:  [97 %-99 %] 99 % (01/01 0819)      Intake/Output from previous day: 12/31 0701 - 01/01 0700 In: -  Out: 90 [Chest Tube:90]   Physical Exam:  Cardiovascular: RRR Pulmonary: No increased work of breathing Extremities:No lower extremity edema. Wounds: Dressing is wound lean and dry.   Chest Tubes:to water seal, +++ air leak with cough  Lab Results: CBC: Recent Labs    04/17/20 0122  WBC 7.0  HGB 9.4*  HCT 29.5*  PLT 441*   BMET:  Recent Labs    04/17/20 0122  NA 137  K 3.8  CL 101  CO2 26  GLUCOSE 124*  BUN 14  CREATININE 0.62  CALCIUM 8.8*    PT/INR: No results for input(s): LABPROT, INR in the last 72 hours. ABG:  INR: Will add last result for INR, ABG once components are confirmed Will add last 4 CBG results once components are confirmed  Assessment/Plan:  1. CV - SR/ST with HR in the 100's this am. On Lopressor 12.5 mg bid. Per primary 2.  Pulmonary - On room air. Chest tubes with 90 ccrecorded last 24 hours. Chest tube is to water seal and has a +++ air leak with cough.  This am's CXR appears stable (moderate, residual left pneumothorax and right lung nodule vs abscess). She likely will need another thoracic surgery. Chest tubes to remain. Encourage incentive spirometer. 3. CBGs 160/106/141. On Insulin PRN. Per primary 4. Anemia-H and H this am 10.2 and 30.3 5. ID- Given recent findings  of TR and per infectious disease,  Transitioned  to oral Cefdinir for 4 weeks for a total of 6 weeks of antibiotic therapy.  Donielle M ZimmermanPA-C 04/17/2020,8:34 AM [phone]  Agree with above Continue CT management and abx for now  Izamar Linden O Rahma Meller

## 2020-04-18 ENCOUNTER — Inpatient Hospital Stay (HOSPITAL_COMMUNITY): Payer: Medicaid Other

## 2020-04-18 DIAGNOSIS — J948 Other specified pleural conditions: Secondary | ICD-10-CM | POA: Diagnosis not present

## 2020-04-18 DIAGNOSIS — I959 Hypotension, unspecified: Secondary | ICD-10-CM | POA: Diagnosis not present

## 2020-04-18 DIAGNOSIS — E43 Unspecified severe protein-calorie malnutrition: Secondary | ICD-10-CM

## 2020-04-18 DIAGNOSIS — D539 Nutritional anemia, unspecified: Secondary | ICD-10-CM

## 2020-04-18 DIAGNOSIS — F1990 Other psychoactive substance use, unspecified, uncomplicated: Secondary | ICD-10-CM | POA: Diagnosis not present

## 2020-04-18 DIAGNOSIS — J90 Pleural effusion, not elsewhere classified: Secondary | ICD-10-CM | POA: Diagnosis not present

## 2020-04-18 DIAGNOSIS — R000 Tachycardia, unspecified: Secondary | ICD-10-CM

## 2020-04-18 LAB — CBC WITH DIFFERENTIAL/PLATELET
Abs Immature Granulocytes: 0.02 10*3/uL (ref 0.00–0.07)
Basophils Absolute: 0 10*3/uL (ref 0.0–0.1)
Basophils Relative: 0 %
Eosinophils Absolute: 0.1 10*3/uL (ref 0.0–0.5)
Eosinophils Relative: 2 %
HCT: 29.2 % — ABNORMAL LOW (ref 36.0–46.0)
Hemoglobin: 9.6 g/dL — ABNORMAL LOW (ref 12.0–15.0)
Immature Granulocytes: 0 %
Lymphocytes Relative: 36 %
Lymphs Abs: 1.9 10*3/uL (ref 0.7–4.0)
MCH: 28.8 pg (ref 26.0–34.0)
MCHC: 32.9 g/dL (ref 30.0–36.0)
MCV: 87.7 fL (ref 80.0–100.0)
Monocytes Absolute: 0.6 10*3/uL (ref 0.1–1.0)
Monocytes Relative: 12 %
Neutro Abs: 2.7 10*3/uL (ref 1.7–7.7)
Neutrophils Relative %: 50 %
Platelets: 419 10*3/uL — ABNORMAL HIGH (ref 150–400)
RBC: 3.33 MIL/uL — ABNORMAL LOW (ref 3.87–5.11)
RDW: 15.7 % — ABNORMAL HIGH (ref 11.5–15.5)
WBC: 5.3 10*3/uL (ref 4.0–10.5)
nRBC: 0 % (ref 0.0–0.2)

## 2020-04-18 MED ORDER — CYCLOBENZAPRINE HCL 10 MG PO TABS
5.0000 mg | ORAL_TABLET | Freq: Three times a day (TID) | ORAL | Status: DC | PRN
  Administered 2020-04-18 – 2020-04-29 (×14): 5 mg via ORAL
  Filled 2020-04-18 (×14): qty 1

## 2020-04-18 NOTE — Progress Notes (Signed)
[facility]  PROGRESS NOTE  Alyssa Vargas TIW:[id number] DOB: Aug 21, 1985 DOA: 04/01/2020 PCP: Patient, No Pcp Per Admit date - 04/01/2020   Admitting Physician Courage Mariea Clonts, MD  Outpatient Primary MD for the patient is Patient, No Pcp Per  LOS - 16 Brief Narrative   Alyssa Vargas is a 35 y.o. year old female with medical history significant for significant for IV drug abuse, recent positive for COVID-19 (per home test on 12/11) who presented to [facility] with dyspnea and cough and was found to have COVID-19 infection (12/16) with left-sided pleural effusion complicated by mediastinal shift requiring thoracentesis with removal of pleural fluid consistent with empyema secondary to Streptococcus pneumonia.  CT-guided chest tube was placed and cultures required VATS decortication and further empyema drainage on 04/05/2020 by CT surgery (transferred to [facility] on 12/18)     Subjective  Today reports pain is mostly well controlled related to chest tubes.  Does have some difficulty with positioning in bed  A & P   Acute hypoxic respiratory failure secondary to Covid viral pneumonia complicated by left-sided empyema from strep pneumo, resolved.  Stable on room air.  Completed course of IV steroids and remdesivir during hospital stay.  Completed IV ceftriaxone 2-week course.  Prior hospitalist discussed with infectious disease who recommended transitioning to oral cefdinir for total of 4 weeks -Currently on cefdinir, to continue 4 weeks -Continue as needed pain control (given chest tube) with IV Dilaudid 0.5 mg as needed severe pain, oxycodone 5 mg as needed moderate pain, Flexeril as needed muscle spasms  Tricuspid valve regurgitation noticed TV thickening with eccentric regurgitation.  No evidence of gross vegetation.  No bacteremia.  But high risk for endocarditis given IV drug use -Per prior hospital discussion with ID continue oral cefdinir for total of 4 weeks  Left-sided  empyema in setting of recent COVID-19 infection.  Per CT surgery air leak persists.  Chest x-ray shows unresolved left hydropneumothorax -Require repeat thoracic procedure, pending CT surgery recommendations -Continue chest tube asked  Sinus tachycardia, persistent.  Suspect related to pain and some low level of dehydration.  TTE shows preserved EF, previous mild circumferential pericardial effusion now resolved. -On low-dose Lopressor (currently limited by blood pressure)   Hypotension, stable.  Range SBP 85-106.  Currently 106/75.  Mains asymptomatic. 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  Last use on 03/28/2020. -Emphasized cessation  Depression, anorexia, stable -Continue BuSpar  Pericardial effusion, resolved.  Initially seen on TTE on 12/18.  Resolved on repeat echo on 12/28.  Likely the setting of Covid infection  Chronic normocytic anemia, relatively stable.  Globin baseline 11-12.  Has remained stable at 9.5-10 for the past 5 days.  No bleeding episodes. -CBC    Family Communication  : None  Code Status : Full  Disposition Plan  :  Patient is from home. Anticipated d/c date:  Greater than 3 days  Barriers to d/c or necessity for inpatient status:  Still requiring chest tube, may warrant repeat thoracic surgery pending CT surgery recommendations Consults  : CT surgery  Procedures  :   12/17>> ultrasound guided thoracocentesis by radiology 12/18>> CT-guided 14 French left chest tube placement by IR 12/20>> left VATS/empyema drainage, decortication  DVT Prophylaxis  :  Lovenox   MDM: The below labs and imaging reports were reviewed and summarized above.  Medication management as above.  Lab Results  Component Value Date  PLT 419 (H) 04/18/2020    Diet :  Diet Order            Diet regular Room service appropriate? Yes; Fluid consistency: Thin  Diet effective now                   Inpatient Medications Scheduled Meds: . acidophilus  2 capsule Oral Daily  . albuterol  2 puff Inhalation BID  . bisacodyl  5 mg Oral Daily  . busPIRone  10 mg Oral TID  . cefdinir  600 mg Oral Daily  . Chlorhexidine Gluconate Cloth  6 each Topical Daily  . dextromethorphan-guaiFENesin  1 tablet Oral BID  . enoxaparin (LOVENOX) injection  40 mg Subcutaneous Q24H  . feeding supplement  1 Container Oral BID BM  . feeding supplement  237 mL Oral TID BM  . fentaNYL (SUBLIMAZE) injection  12.5 mcg Intravenous Once  . lidocaine  1 patch Transdermal Q24H  . metoprolol tartrate  12.5 mg Oral BID  . multivitamin with minerals  1 tablet Oral Daily  . nicotine  14 mg Transdermal Daily  . pantoprazole  40 mg Oral Daily  . senna-docusate  2 tablet Oral BID  . traZODone  100 mg Oral QHS   Continuous Infusions: . sodium chloride    . sodium chloride 10 mL/hr at 04/07/20 1600   PRN Meds:.Place/Maintain arterial line **AND** sodium chloride, albuterol, ALPRAZolam, cyclobenzaprine, guaiFENesin-dextromethorphan, HYDROmorphone (DILAUDID) injection, hydrOXYzine, ondansetron (ZOFRAN) IV, oxyCODONE, sodium chloride flush, zolpidem  Antibiotics  :   Anti-infectives (From admission, onward)   Start     Dose/Rate Route Frequency Ordered Stop   04/16/20 1000  cefdinir (OMNICEF) capsule 600 mg        600 mg Oral Daily 04/14/20 1011 05/14/20 0959   04/06/20 1045  cefTRIAXone (ROCEPHIN) 2 g in sodium chloride 0.9 % 100 mL IVPB        2 g 200 mL/hr over 30 Minutes Intravenous Every 24 hours 04/06/20 0945 04/15/20 1726   04/05/20 1100  cefTRIAXone (ROCEPHIN) 2 g in sodium chloride 0.9 % 100 mL IVPB  Status:  Discontinued        2 g 200 mL/hr over 30 Minutes Intravenous Every 24 hours 04/05/20 1023 04/05/20 1453   04/03/20 1600  vancomycin (VANCOREADY) IVPB 750 mg/150 mL  Status:  Discontinued        750 mg 150 mL/hr over 60 Minutes Intravenous Every 12 hours 04/03/20 1500 04/05/20 1020    04/03/20 1000  remdesivir 100 mg in sodium chloride 0.9 % 100 mL IVPB  Status:  Discontinued       "Followed by" Linked Group Details   100 mg 200 mL/hr over 30 Minutes Intravenous Daily 04/02/20 0654 04/02/20 0656   04/03/20 1000  remdesivir 100 mg in sodium chloride 0.9 % 100 mL IVPB       "Followed by" Linked Group Details   100 mg 200 mL/hr over 30 Minutes Intravenous Daily 04/02/20 0658 04/06/20 0948   04/03/20 0400  vancomycin (VANCOREADY) IVPB 500 mg/100 mL  Status:  Discontinued        500 mg 100 mL/hr over 60 Minutes Intravenous Every 12 hours 04/02/20 1459 04/03/20 1500   04/02/20 2200  piperacillin-tazobactam (ZOSYN) IVPB 3.375 g  Status:  Discontinued        3.375 g 12.5 mL/hr over 240 Minutes Intravenous Every 8 hours 04/02/20 1332 04/05/20 1023   04/02/20 1515  vancomycin (VANCOREADY) IVPB 1250 mg/250 mL  Status:  Discontinued  1,250 mg 166.7 mL/hr over 90 Minutes Intravenous Every 24 hours 04/02/20 1449 04/02/20 1449   04/02/20 1515  vancomycin (VANCOREADY) IVPB 1250 mg/250 mL        1,250 mg 166.7 mL/hr over 90 Minutes Intravenous  Once 04/02/20 1449 04/02/20 1701   04/02/20 1345  piperacillin-tazobactam (ZOSYN) IVPB 3.375 g        3.375 g 100 mL/hr over 30 Minutes Intravenous  Once 04/02/20 1332 04/02/20 1434   04/02/20 0900  remdesivir 100 mg in sodium chloride 0.9 % 100 mL IVPB       "Followed by" Linked Group Details   100 mg 200 mL/hr over 30 Minutes Intravenous  Once 04/02/20 0658 04/02/20 1251   04/02/20 0800  remdesivir 100 mg in sodium chloride 0.9 % 100 mL IVPB       "Followed by" Linked Group Details   100 mg 200 mL/hr over 30 Minutes Intravenous  Once 04/02/20 0658 04/02/20 0853   04/02/20 0700  remdesivir 200 mg in sodium chloride 0.9% 250 mL IVPB  Status:  Discontinued       "Followed by" Linked Group Details   200 mg 580 mL/hr over 30 Minutes Intravenous Once 04/02/20 0654 04/02/20 0656       Objective   Vitals:   04/18/20 0959 04/18/20  1117 04/18/20 1330 04/18/20 1447  BP: 100/64 92/60 (!) 86/61 106/75  Pulse: 89 91 (!) 101 99  Resp: (!) 26 (!) 34 20 14  Temp: 98.1 F (36.7 C) 98 F (36.7 C) 98.3 F (36.8 C) 98.1 F (36.7 C)  TempSrc: Oral Oral  Oral  SpO2: 100% 99% 99% 100%  Weight:      Height:        SpO2: 100 % O2 Flow Rate (L/min): 0 L/min FiO2 (%): 21 %  Wt Readings from Last 3 Encounters:  04/02/20 50.2 kg  05/27/19 58.1 kg  05/26/19 58.1 kg    No intake or output data in the 24 hours ending 04/18/20 1647  Physical Exam:     Awake Alert, Oriented X 3, Normal affect, thin female No new F.N deficits,  [place].AT, Normal respiratory effort on room air, left-sided chest tube in place with overlying dressing, crackles appreciated at bases RRR,No Gallops,Rubs or new Murmurs,  +ve B.Sounds, Abd Soft, No tenderness, No rebound, guarding or rigidity. No Cyanosis, No new Rash or bruise     I have personally reviewed the following:   Data Reviewed:  CBC Recent Labs  Lab 04/12/20 0429 04/13/20 0154 04/17/20 0122 04/18/20 0227  WBC 8.9 10.2 7.0 5.3  HGB 10.2* 9.6* 9.4* 9.6*  HCT 30.3* 29.2* 29.5* 29.2*  PLT 519* 474* 441* 419*  MCV 84.4 86.4 87.5 87.7  MCH 28.4 28.4 27.9 28.8  MCHC 33.7 32.9 31.9 32.9  RDW 15.3 15.5 15.7* 15.7*  LYMPHSABS  --   --  1.9 1.9  MONOABS  --   --  0.6 0.6  EOSABS  --   --  0.1 0.1  BASOSABS  --   --  0.0 0.0    Chemistries  Recent Labs  Lab 04/12/20 0429 04/13/20 0154 04/17/20 0122  NA 135 139 137  K 4.2 4.2 3.8  CL 101 105 101  CO2 26 25 26   GLUCOSE 95 97 124*  BUN 12 17 14   CREATININE 0.52 0.65 0.62  CALCIUM 8.8* 8.3* 8.8*  AST 13* 17  --   ALT 14 16  --   ALKPHOS 52 50  --  BILITOT 0.3 0.6  --    ------------------------------------------------------------------------------------------------------------------ No results for input(s): CHOL, HDL, LDLCALC, TRIG, CHOLHDL, LDLDIRECT in the last 72 hours.  No results found for:  HGBA1C ------------------------------------------------------------------------------------------------------------------ No results for input(s): TSH, T4TOTAL, T3FREE, THYROIDAB in the last 72 hours.  Invalid input(s): FREET3 ------------------------------------------------------------------------------------------------------------------ No results for input(s): VITAMINB12, FOLATE, FERRITIN, TIBC, IRON, RETICCTPCT in the last 72 hours.  Coagulation profile No results for input(s): INR, PROTIME in the last 168 hours.  No results for input(s): DDIMER in the last 72 hours.  Cardiac Enzymes No results for input(s): CKMB, TROPONINI, MYOGLOBIN in the last 168 hours.  Invalid input(s): CK ------------------------------------------------------------------------------------------------------------------ No results found for: BNP  Micro Results No results found for this or any previous visit (from the past 240 hour(s)).  Radiology Reports DG Chest 1 View  Result Date: 04/14/2020 CLINICAL DATA:  Empyema, chest tube EXAM: CHEST  1 VIEW COMPARISON:  04/13/2020 FINDINGS: Two left-sided chest tubes remain present. Left hydropneumothorax is again identified with increased pneumothorax component. Left lung atelectasis/consolidation. Small patchy opacity the lower right lung is unchanged. Similar cardiomediastinal contours. IMPRESSION: Left hydropneumothorax with increased pneumothorax component. Two left-sided chest tubes remain in place. Left lung atelectasis/consolidation. Unchanged small right lower lung focal atelectasis/consolidation. Electronically Signed   By: Guadlupe Spanish M.D.   On: 04/14/2020 08:08   DG Chest 1 View  Result Date: 04/12/2020 CLINICAL DATA:  Empyema. EXAM: CHEST  1 VIEW COMPARISON:  April 10, 2020 FINDINGS: The cardiomediastinal silhouette is unchanged in contour. Multiple LEFT-sided chest tubes. Unchanged small to moderate LEFT hydropneumothorax with a loculated apical  component. Persistent heterogeneous opacification of base and periphery of the LEFT lung. Visualized abdomen is unremarkable. No acute osseous abnormality. IMPRESSION: Unchanged small to moderate LEFT hydropneumothorax with a loculated apical component. Electronically Signed   By: Meda Klinefelter MD   On: 04/12/2020 07:52   DG Chest 1 View  Result Date: 04/09/2020 CLINICAL DATA:  35 year old female with pneumothorax status post chest tube placement. EXAM: CHEST  1 VIEW COMPARISON:  Chest radiograph dated 04/09/2020 and CT dated 04/02/2020 FINDINGS: Two left sided chest tubes appear in similar position. No significant interval change in the size of the left pneumothorax compared to the prior radiograph. Left subclavian central venous catheter in similar position. Bilobed nodular density in the right mid lung field as seen previously. Stable cardiomediastinal silhouette. No acute osseous pathology. IMPRESSION: No significant interval change in the size of the left pneumothorax compared to the prior radiograph. Two left-sided chest tubes appear in similar position. Electronically Signed   By: Elgie Collard M.D.   On: 04/09/2020 19:10   CT Angio Chest PE W and/or Wo Contrast  Result Date: 04/02/2020 CLINICAL DATA:  COVID exposure, dyspnea, cough, malaise, positive D-dimer EXAM: CT ANGIOGRAPHY CHEST WITH CONTRAST TECHNIQUE: Multidetector CT imaging of the chest was performed using the standard protocol during bolus administration of intravenous contrast. Multiplanar CT image reconstructions and MIPs were obtained to evaluate the vascular anatomy. CONTRAST:  OMNIPAQUE IOHEXOL 350 MG/ML SOLN COMPARISON:  None. FINDINGS: Cardiovascular: There is adequate opacification of the pulmonary arterial tree. There is no intraluminal filling defect identified to suggest acute pulmonary embolism. The central pulmonary arteries are of normal caliber. There is marked mediastinal shift to the right. Global cardiac  size within normal limits. No significant coronary artery calcification. No pericardial effusion. The thoracic aorta is unremarkable. Mediastinum/Nodes: Thyroid unremarkable. Soft tissue within the anterior mediastinum likely represents rebound thymic or residual thymic tissue. The esophagus is  unremarkable. No pathologic thoracic adenopathy is identified. Lungs/Pleura: There is a massive left pleural effusion which completely fills the left hemithorax, completely collapses the left lung, and demonstrates marked mass effect upon the mediastinum with marked mediastinal shift to the right. There is interstitial gas within a portion of the left lower lobe likely representing the lateral segment of the left lower lobe which may represent necrosis of the setting of necrotizing pneumonia. Mild patchy ground-glass infiltrate within the a right upper lobe anteriorly is nonspecific, possibly infectious or inflammatory. A bilobed fluid density structure is seen within the right middle lobe measuring 2.3 x 3.6 x 1.6 cm in greatest dimension, nonspecific, but possibly the sequela of prior infection or trauma. Upper Abdomen: No acute abnormality. Musculoskeletal: No acute bone abnormality. Review of the MIP images confirms the above findings. IMPRESSION: No pulmonary embolism. Massive left pleural effusion, possibly representing a a parapneumonic effusion or empyema, demonstrating marked mass effect upon the mediastinum with marked left right mediastinal shift. Extensive interstitial gas within the probable lateral segment of the right lower lobe suggesting parenchymal necrosis in the setting of necrotizing pneumonia. Bilobed fluid-filled structure within the right middle lobe measuring 3.6 cm possibly representing the sequela of remote trauma or inflammation. Minimal patchy infiltrate within the right upper lobe, likely infectious or inflammatory. Electronically Signed   By: Helyn Numbers MD   On: 04/02/2020 04:18   DG CHEST  PORT 1 VIEW  Result Date: 04/18/2020 CLINICAL DATA:  35 year old female who presented with large left empyema. Now postoperative day 13 status post left VATS and decortication. History of IV drug use. COVID-19. EXAM: PORTABLE CHEST 1 VIEW COMPARISON:  Portable chest 04/17/2020 and earlier. FINDINGS: Two left chest tubes remain stable. Continued left hydropneumothorax. Volume of pleural air appears slightly smaller than yesterday. Continued poor expansion of the left lung which appears thickened about the left hilum. Stable right lung volume and ventilation with nodular right mid lung opacity as seen by CT 04/02/2020. Stable cardiac size and mediastinal contours. Visualized tracheal air column is within normal limits. Stable visualized osseous structures. IMPRESSION: 1. Stable chest tubes. Unresolved left hydropneumothorax with slightly smaller volume of pleural air since yesterday. 2. Stable contracted left lung with thickening about the hilum. 3. Stable isolated right lung pulmonary nodularity which seemed to have fluid density on CT last month. Electronically Signed   By: Odessa Fleming M.D.   On: 04/18/2020 07:50   DG CHEST PORT 1 VIEW  Result Date: 04/17/2020 CLINICAL DATA:  35 year old female who presented with large left empyema. Now postoperative day 12 status post left VATS and decortication. History of IV drug use. COVID-19. EXAM: PORTABLE CHEST 1 VIEW COMPARISON:  Portable chest 04/16/2020 and earlier. FINDINGS: Portable AP semi upright view at 0548 hours. Two left chest tubes remain. But ongoing moderate left hydropneumothorax with poor expansion of the left lung, thick pleural/parenchymal rind along the left hilum. Right lung remains stable with mid lung nodularity related to low-density nodules or fluid on recent CT. Stable cardiac size and mediastinal contours. Stable visualized osseous structures. Negative visible bowel gas in the upper abdomen. IMPRESSION: 1. Stable left chest tubes. No improvement in  at least moderate size residual left hydropneumothorax, poor expansion of the left lung with pleural/parenchymal rind at the level of the hilum. 2. Stable right lung with small mid lung nodules versus isolated pulmonary abscess as seen by CT. Electronically Signed   By: Odessa Fleming M.D.   On: 04/17/2020 07:53   DG CHEST  PORT 1 VIEW  Result Date: 04/16/2020 CLINICAL DATA:  35 year old female who presented with massive left pleural effusion, rightward mediastinal shift. CT-guided left chest tube placement returned purulence fluid. Empyema. Now postoperative day 11 status post left VATS and decortication. History of IV drug use. COVID-19. EXAM: PORTABLE CHEST 1 VIEW COMPARISON:  Portable chest 04/15/2020 and earlier. FINDINGS: Portable AP upright view at 0828 hours. Two left chest tubes remain in place. Moderate size left hydropneumothorax, predominantly with pleural gas, and superimposed on expanded left lung with areas of pleural rind and/or dense lung parenchymal opacification along the hilum and medial left lung base. Ventilation stable since 04/14/2020, and the lung has partially collapsed since 04/10/2020. Stable cardiac size and mediastinal contours. Indistinct mid right lung pulmonary nodularity corresponding to fluid density nodules on the earlier CT. The right lung elsewhere appears clear. No acute osseous abnormality identified. IMPRESSION: 1. Status post left VATS, drained left empyema with two chest tubes in place but un-expanded left lung with pleural/parenchymal rind and moderate residual left pneumothorax with small volume fluid component. Ventilation is stable since 04/14/2020, the lung is slightly more collapse since 04/10/2020. 2. Stable subtle right mid lung nodules which might be small pulmonary abscess based on earlier CT appearance. Electronically Signed   By: Odessa Fleming M.D.   On: 04/16/2020 08:48   DG CHEST PORT 1 VIEW  Result Date: 04/15/2020 CLINICAL DATA:  Pneumothorax, chest tube EXAM:  PORTABLE CHEST 1 VIEW COMPARISON:  04/14/2020 FINDINGS: Two left-sided chest tubes remain present. Left hydropneumothorax is again identified with similar pneumothorax component. Left lung atelectasis/consolidation is unchanged. Small patchy opacity of the lower right lung is also unchanged. Similar cardiomediastinal contours. IMPRESSION: Similar appearance of left hydropneumothorax with 2 chest tubes in place. Stable lung aeration. Electronically Signed   By: Guadlupe Spanish M.D.   On: 04/15/2020 08:08   DG CHEST PORT 1 VIEW  Result Date: 04/13/2020 CLINICAL DATA:  Pneumothorax.  Chest tube.  COVID. EXAM: PORTABLE CHEST 1 VIEW COMPARISON:  04/12/2020. FINDINGS: Two left chest tubes in stable position. Stable left-sided pneumothorax. Stable atelectatic changes left lung. Stable left-sided pleural thickening. Mild atelectasis/infiltrate right mid lung noted on today's exam. Heart size stable. Thoracic spine scoliosis. No acute bony abnormality. IMPRESSION: 1. Two left chest tubes in stable position. Stable left-sided pneumothorax. Stable atelectatic changes left lung. Stable left-sided pleural thickening. 2. Mild atelectasis/infiltrate right mid lung noted on today's exam. Electronically Signed   By: Maisie Fus  Register   On: 04/13/2020 06:12   DG Chest Port 1 View  Result Date: 04/10/2020 CLINICAL DATA:  Chest tube in place EXAM: PORTABLE CHEST 1 VIEW COMPARISON:  Yesterday FINDINGS: Loculated appearing basal and lateral left pneumothorax, possibly ex vacuo given the degree of pleural disease, unchanged. Chest tubes are in stable position. Normal heart size. Right base nodularity is unchanged. IMPRESSION: Unchanged loculated appearing pneumothorax on the left. Electronically Signed   By: Marnee Spring M.D.   On: 04/10/2020 08:50   DG Chest Port 1 View  Result Date: 04/09/2020 CLINICAL DATA:  Vats/empyema. Left-sided chest tube placement. COVID positive. EXAM: PORTABLE CHEST 1 VIEW COMPARISON:   04/08/2020 FINDINGS: Left subclavian line tip at mid SVC. Mild tracheal deviation to the left. Normal heart size. Two left chest tubes are unchanged in position. Decrease in subcutaneous emphysema about the left chest wall. Moderate left-sided hydropneumothorax is similar. Interstitial thickening throughout the right lung. Persistent right midlung nodular opacity. IMPRESSION: Relatively similar appearance of the chest since 1 day prior. Left-sided  hydropneumothorax with 2 chest tubes in place. Minimal decrease in subcutaneous emphysema. Right midlung nodular opacity, similar. Electronically Signed   By: Abigail Miyamoto M.D.   On: 04/09/2020 13:07   DG CHEST PORT 1 VIEW  Result Date: 04/08/2020 CLINICAL DATA:  Follow-up chest tube EXAM: PORTABLE CHEST 1 VIEW COMPARISON:  Radiograph 04/05/2020 FINDINGS: *Left subclavian approach central venous catheter tip terminates at the left brachiocephalic-caval confluence. *There are 2 left apically directed chest tubes which remain in place. *Telemetry leads and external support devices overlie the chest. There is a persistent complex left hydropneumothorax, not significantly changed in size from prior with persistent apical capping and lateral basal air lucency. Some more focal opacity along the pleural surfaces, could reflect a developing pleural rind with more coalescent opacity in the left lung base though only partially re-expanded. Redemonstrated bilobed nodular opacity present in the right lung base as well with additional streaky opacities favoring residual atelectasis or edema. Cardiomediastinal contours are unchanged. Extensive subcutaneous emphysema across the left chest wall and base of the left neck. IMPRESSION: 1. Stable complex left hydropneumothorax with 2 chest tubes in place. Overall size is unchanged from prior. 2. Some more focal opacity along the pleural surfaces, could reflect a developing pleural rind with more coalescent opacity in the left lung base  though only partially re-expanded. 3. Extensive subcutaneous emphysema across the left chest wall and base of the left neck. 4. Stable bilobed nodular opacity in the right middle lobe. Some additional atelectatic changes and/or edema bilaterally. Electronically Signed   By: Lovena Le M.D.   On: 04/08/2020 22:04   DG CHEST PORT 1 VIEW  Result Date: 04/08/2020 CLINICAL DATA:  Pneumothorax. EXAM: PORTABLE CHEST 1 VIEW COMPARISON:  04/07/2020 and prior. FINDINGS: Lateral left basilar/sub pulmonic pneumothorax is unchanged. Apically terminating dual left chest tubes. Left subclavian CVC is unchanged with tip overlying the azygos. Left lung volume loss and basilar opacities, similar prior exam. Clear right lung. IMPRESSION: Lateral left basilar/subpulmonic pneumothorax is unchanged. Indwelling apically terminating left chest tubes. Electronically Signed   By: Primitivo Gauze M.D.   On: 04/08/2020 09:06   DG Chest Port 1 View  Result Date: 04/07/2020 CLINICAL DATA:  Coronavirus infection.  Chest tube. EXAM: PORTABLE CHEST 1 VIEW COMPARISON:  04/06/2020 FINDINGS: Left subclavian central line tip the level of the azygos vein as seen previously. Two left chest tubes remain in place. Less pleural air than was seen yesterday. Small to moderate left pleural air collection does persist. Persistent infiltrate and volume loss in the left lung. Small amount of pleural fluid on the left. Few mild patchy infiltrates in the right lung appear similar. IMPRESSION: Less pleural air on the left than was seen yesterday. Small to moderate left pleural air collection does persist. Persistent infiltrate and volume loss in the left lung. Few mild patchy infiltrates in the right lung appear similar. Electronically Signed   By: Nelson Chimes M.D.   On: 04/07/2020 08:09   DG CHEST PORT 1 VIEW  Result Date: 04/06/2020 CLINICAL DATA:  Chest tube status post VATS.  COVID positive. EXAM: PORTABLE CHEST 1 VIEW COMPARISON:   04/05/2020.  CT 04/02/2020. FINDINGS: Left subclavian line and 2 left chest tubes in stable position. Interim slight progression of left-sided pneumothorax. Stable left-sided pleural thickening. Persistent left lung atelectasis/infiltrate. Persistent density noted over the right lower chest. This density was noted to be within the right lung base on prior CT. This could represent a collection of pleural fluid and or  atelectasis. Heart size stable. Left chest wall and supraclavicular subcutaneous emphysema again noted. IMPRESSION: 1. Stable positioning of left subclavian line and 2 left chest tubes. Interim slight progression of left-sided pneumothorax. Left chest wall and supraclavicular subcutaneous emphysema again noted without interim change. 2. Persistent left lung atelectasis/infiltrate. Stable left-sided pleural thickening. 3. Persistent density noted the right lower chest consistent with fluid pseudotumor and or atelectasis. Electronically Signed   By: Maisie Fus  Register   On: 04/06/2020 06:42   DG Chest Port 1 View  Result Date: 04/05/2020 CLINICAL DATA:  Chest tube in place. EXAM: PORTABLE CHEST 1 VIEW COMPARISON:  04/05/2020. FINDINGS: Left IJ line noted with tip over SVC. Interval removal of left pigtail chest tube. Interval placement of 2 large bore chest tubes. Interval near complete resolution of large left pleural effusion. Small pneumothorax noted. Atelectasis/infiltrate left lung. Density noted over the right lung may be overlying the chest. Heart size normal. Left chest wall subcutaneous emphysema. IMPRESSION: 1. Interim removal of left pigtail chest tube. Interval placement of 2 large bore chest tubes with near complete resolution of large left pleural effusion. Small left pneumothorax noted. 2. Associated atelectasis/infiltrate left lung. These results will be called to the ordering clinician or representative by the Radiologist Assistant, and communication documented in the PACS or [employer]. Electronically Signed   By: Maisie Fus  Register   On: 04/05/2020 16:26   DG Chest Port 1 View  Result Date: 04/05/2020 CLINICAL DATA:  Shortness of breath, COVID-19 positivity EXAM: PORTABLE CHEST 1 VIEW COMPARISON:  04/04/2020 FINDINGS: Cardiac shadow is obscured by the large left empyema. Right lung remains clear. Left hemithorax demonstrates opacification relatively similar to that seen on the prior exam. Pigtail catheter is noted in place. Slight improved aeration in the left upper lobe is noted. No bony abnormality is seen. IMPRESSION: Slight improved aeration on the left. Pigtail catheter remains in place with relatively CT stable left empyema. Electronically Signed   By: Alcide Clever M.D.   On: 04/05/2020 09:04   DG Chest Port 1 View  Result Date: 04/04/2020 CLINICAL DATA:  Shortness of breath.  History of COVID. EXAM: PORTABLE CHEST 1 VIEW COMPARISON:  Chest radiograph 04/02/2020. FINDINGS: Monitoring leads overlie the patient. Cardiac and mediastinal contours largely obscured. Interval insertion left chest tube. Interval decrease in size of left pleural fluid within the upper hemithorax and mid hemithorax. Similar-appearing nodular opacity right mid lung. IMPRESSION: 1. Interval insertion left chest tube with interval decrease in size of left pleural fluid. 2. Similar-appearing nodular opacity right mid lung. Electronically Signed   By: Annia Belt M.D.   On: 04/04/2020 10:46   DG Chest Portable 1 View  Result Date: 04/02/2020 CLINICAL DATA:  Large LEFT pleural effusion EXAM: PORTABLE CHEST 1 VIEW COMPARISON:  Portable exam at 1203 compared to 0227 hrs FINDINGS: Resolution of previously identified LEFT to RIGHT mediastinal shift. Unable to assess heart size due to subtotal opacification of the LEFT hemithorax. Minimal aeration now seen in LEFT lung. No pneumothorax. Nodular foci at inferior RIGHT lung again seen with minimal RIGHT basilar atelectasis. Osseous structures unremarkable.  IMPRESSION: No pneumothorax following LEFT thoracentesis. Resolution of LEFT to RIGHT mediastinal shift. Persistent nodular foci at inferior RIGHT hemithorax. Electronically Signed   By: Ulyses Southward M.D.   On: 04/02/2020 12:32   DG Chest Port 1 View  Result Date: 04/02/2020 CLINICAL DATA:  COVID, shortness of breath EXAM: PORTABLE CHEST 1 VIEW COMPARISON:  None. FINDINGS: Complete whiteout of the left hemithorax,  likely related to a combination of effusion and airspace disease. Heart and mediastinal structures are shifted to the right. Right mid lung patchy airspace disease. No effusion on the right. No acute bony abnormality. IMPRESSION: IMPRESSION Complete opacification of the left hemithorax, likely related to effusion and airspace disease. Patchy right mid lung airspace disease. Electronically Signed   By: Charlett Nose M.D.   On: 04/02/2020 02:48   CT [facility] PLEURAL DRAIN W/INDWELL CATH W/IMG GUIDE  Result Date: 04/03/2020 CLINICAL DATA:  Large left pleural empyema, COVID-19 infection and history of IV drug use. Request for percutaneous thoracostomy tube placement to drain the empyema. EXAM: CT GUIDED CATHETER DRAINAGE OF LEFT PLEURAL EMPYEMA ANESTHESIA/SEDATION: 2.0 mg IV Versed 100 mcg IV Fentanyl Total Moderate Sedation Time:  10 minutes The patient's level of consciousness and physiologic status were continuously monitored during the procedure by Radiology nursing. PROCEDURE: The procedure, risks, benefits, and alternatives were explained to the patient. Questions regarding the procedure were encouraged and answered. The patient understands and consents to the procedure. A time out was performed prior to initiating the procedure. CT was performed through the chest in a supine position. The left chest wall was prepped with chlorhexidine in a sterile fashion, and a sterile drape was applied covering the operative field. A sterile gown and sterile gloves were used for the procedure. Local anesthesia was  provided with 1% Lidocaine. Under CT guidance, an 18 gauge trocar needle was advanced into the left pleural space from an anterolateral approach. After confirming needle tip position, fluid was aspirated. A guidewire was advanced and the needle removed. The percutaneous tract was dilated and a 14 French pigtail drainage catheter advanced into the left pleural space. Catheter position was confirmed by CT. The pigtail catheter was connected to a Pleur-evac device. The catheter was secured at the skin with a Prolene retention suture, StatLock device and dressed with a Vaseline gauze dressing. COMPLICATIONS: None FINDINGS: A large amount of residual left pleural fluid remains despite large volume thoracentesis yesterday. Aspiration from the pleural space yielded grossly purulent fluid. Repeat laboratory analysis was not performed as the fluid removed at the time of thoracentesis yesterday was sent for laboratory testing. After placement of the 14 French drainage catheter, there is good return of purulent fluid. The Pleur-evac will be connected to wall suction at -20 cm of water when the patient returns to her hospital room. IMPRESSION: CT-guided placement of 14 French left chest tube. A 14 French drainage catheter was placed with return of purulent fluid. The drainage catheter was attached to a Pleur-evac device which will be attached to wall suction. Electronically Signed   By: Irish Lack M.D.   On: 04/03/2020 13:33   ECHOCARDIOGRAM LIMITED  Result Date: 04/12/2020    ECHOCARDIOGRAM LIMITED REPORT   Patient Name:   DELESA Poppell Date of Exam: 04/12/2020 Medical Rec #:  [id number]     Height:       67.0 in Accession #:    [id number]    Weight:       110.7 lb Date of Birth:  November 21, 1985     BSA:          1.573 m Patient Age:    34 years      BP:           94/63 mmHg Patient Gender: F             HR:           106 bpm. Exam Location:  Inpatient Procedure: Limited Echo, Cardiac Doppler and Color Doppler  Indications:    I31.3 Pericardial effusion (noninflammatory)  History:        Patient has prior history of Echocardiogram examinations, most                 recent 04/03/2020. Abnormal ECG; Risk Factors:Current Smoker.                 Covid positive. IVDU.  Sonographer:    Sheralyn Boatmanina West RDCS Referring Phys: [id number] [address] S [facility]  Sonographer Comments: Technically difficult study due to poor echo windows. Chest tube in apical region. IMPRESSIONS  1. Left ventricular ejection fraction, by estimation, is 65 to 70%. The left ventricle has normal function. The left ventricle has no regional wall motion abnormalities. There is mild concentric left ventricular hypertrophy. Left ventricular diastolic function could not be evaluated.  2. Right ventricular systolic function is normal. The right ventricular size is mildly enlarged. There is normal pulmonary artery systolic pressure.  3. No evidence of mitral valve regurgitation.  4. Tricuspid valve thickening noted with eccentric regurgitation.  5. The aortic valve is tricuspid. Aortic valve regurgitation is not visualized.  6. The inferior vena cava is normal in size with greater than 50% respiratory variability, suggesting right atrial pressure of 3 mmHg. Comparison(s): A prior study was performed on 04/03/20. Difficult comparison from prior: Patient was not able to tolerate full study 04/03/20. In this study, tricuspid regurgitation is more prominent with tricuspid valve thickening and no gross vegetation. Pericardial effusion has resolved. FINDINGS  Left Ventricle: Left ventricular ejection fraction, by estimation, is 65 to 70%. The left ventricle has normal function. The left ventricle has no regional wall motion abnormalities. The left ventricular internal cavity size was normal in size. There is  mild concentric left ventricular hypertrophy. Left ventricular diastolic function could not be evaluated. Right Ventricle: The right ventricular size is mildly enlarged. Right  ventricular systolic function is normal. There is normal pulmonary artery systolic pressure. The tricuspid regurgitant velocity is 2.16 m/s, and with an assumed right atrial pressure of 3 mmHg, the estimated right ventricular systolic pressure is 21.7 mmHg. Left Atrium: Left atrial size was normal in size. Pericardium: There is no evidence of pericardial effusion. Tricuspid Valve: Tricuspid valve thickening noted with eccentric regurgitation. Aortic Valve: The aortic valve is tricuspid. Aortic valve regurgitation is not visualized. Aorta: The aortic root and ascending aorta are structurally normal, with no evidence of dilitation. Venous: The inferior vena cava is normal in size with greater than 50% respiratory variability, suggesting right atrial pressure of 3 mmHg. LEFT VENTRICLE PLAX 2D LVIDd:         4.10 cm LVIDs:         3.00 cm LV PW:         1.20 cm LV IVS:        1.00 cm  IVC IVC diam: 1.30 cm LEFT ATRIUM         Index LA diam:    2.40 cm 1.53 cm/m   AORTA Ao Root diam: 2.80 cm Ao Asc diam:  2.20 cm TRICUSPID VALVE TR Peak grad:   18.7 mmHg TR Vmax:        216.00 cm/s Riley LamMahesh Chandrasekhar MD Electronically signed by Riley LamMahesh Chandrasekhar MD Signature Date/Time: 04/12/2020/5:33:47 PM    Final    ECHOCARDIOGRAM LIMITED  Result Date: 04/03/2020    ECHOCARDIOGRAM LIMITED REPORT   Patient Name:   Alyssa Vargas Date of Exam: 04/03/2020 Medical  Rec #:  [id number]     Height:       67.0 in Accession #:    [id number]    Weight:       110.7 lb Date of Birth:  05-Apr-1986     BSA:          1.573 m Patient Age:    34 years      BP:           109/79 mmHg Patient Gender: F             HR:           99 bpm. Exam Location:  Inpatient Procedure: Limited Echo, Limited Color Doppler and Cardiac Doppler Indications:    endocarditis  History:        Patient has no prior history of Echocardiogram examinations.                 Covid. Pleural effusion.; Risk Factors:Current Smoker and IV                 drug use.  Sonographer:     Delcie RochLauren Pennington Referring Phys: [id number] [employer]  Sonographer Comments: Image acquisition challenging due to respiratory motion. IMPRESSIONS  1. Left ventricular ejection fraction, by estimation, is 60 to 65%. The left ventricle has normal function.  2. Right ventricular systolic function is normal. The right ventricular size is normal.  3. A small pericardial effusion is present. The pericardial effusion is circumferential.  4. The mitral valve is normal in structure. No evidence of mitral valve regurgitation. No evidence of mitral stenosis.  5. The aortic valve was not well visualized. Aortic valve regurgitation is not visualized. No aortic stenosis is present.  6. The inferior vena cava is normal in size with greater than 50% respiratory variability, suggesting right atrial pressure of 3 mmHg. FINDINGS  Left Ventricle: Left ventricular ejection fraction, by estimation, is 60 to 65%. The left ventricle has normal function. Right Ventricle: The right ventricular size is normal. Right vetricular wall thickness was not well visualized. Right ventricular systolic function is normal. Pericardium: A small pericardial effusion is present. The pericardial effusion is circumferential. Mitral Valve: The mitral valve is normal in structure. No evidence of mitral valve stenosis. Tricuspid Valve: The tricuspid valve is normal in structure. Tricuspid valve regurgitation is not demonstrated. No evidence of tricuspid stenosis. Aortic Valve: The aortic valve was not well visualized. Aortic valve regurgitation is not visualized. No aortic stenosis is present. Pulmonic Valve: The pulmonic valve was normal in structure. Pulmonic valve regurgitation is not visualized. No evidence of pulmonic stenosis. Venous: The inferior vena cava is normal in size with greater than 50% respiratory variability, suggesting right atrial pressure of 3 mmHg. LEFT VENTRICLE PLAX 2D LVIDd:         3.90 cm LVIDs:         2.30 cm LV IVS:         0.70 cm LVOT diam:     1.60 cm LVOT Area:     2.01 cm  IVC IVC diam: 1.90 cm LEFT ATRIUM         Index LA diam:    2.10 cm 1.34 cm/m   AORTA Ao Root diam: 2.20 cm MITRAL VALVE MV Area (PHT): 5.23 cm    SHUNTS MV Decel Time: 145 msec    Systemic Diam: 1.60 cm MV E velocity: 88.70 cm/s MV A velocity: 61.70 cm/s MV E/A ratio:  1.44 Dina RichJonathan Branch MD Electronically signed  by Dina Rich MD Signature Date/Time: 04/03/2020/2:31:28 PM    Final    US THORACENTESIS ASP PLEURAL SPACE W/IMG GUIDE  Result Date: 04/02/2020 INDICATION: Large LEFT pleural effusion EXAM: ULTRASOUND GUIDED DIAGNSOTIC AND THERAPEUTIC LEFT THORACENTESIS MEDICATIONS: None. COMPLICATIONS: None immediate. PROCEDURE: Procedure, benefits, and risks of procedure were discussed with patient. Written informed consent for procedure was obtained. Time out protocol followed. Pleural effusion localized by ultrasound at the posterior LEFT hemithorax. Fluid is markedly complex containing diffuse internal echogenicity as well as dependent echogenicity. Skin prepped and draped in usual sterile fashion. Skin and soft tissues anesthetized with 10 mL of 1% lidocaine. 8 French thoracentesis catheter placed into the LEFT pleural space. 1.88 L of complex cloudy tan fluid with particulates with purulent appearance aspirated by syringe pump. Findings most likely represent empyema. Procedure tolerated well by patient without immediate complication. FINDINGS: Complex purulent appearing LEFT pleural fluid likely representing empyema. IMPRESSION: Successful ultrasound guided LEFT thoracentesis yielding 1.88 L of pleural fluid. Electronically Signed   By: Ulyses Southward M.D.   On: 04/02/2020 12:39     Time Spent in minutes  30     Laverna Peace M.D on 04/18/2020 at 4:47 PM  To page go to [email or web address] - password [facility]

## 2020-04-18 NOTE — Progress Notes (Addendum)
      [address]       [place] [address]             [phone]       13 Days Post-Op Procedure(s) (LRB): VIDEO ASSISTED THORACOSCOPY (VATS)/EMPYEMA DRAINAGE, DECORTICATION  (Left)  Subjective: Patient sitting on edge of bed talking on phone. Pain at chest tube sites.  Objective: Vital signs in last 24 hours: Temp:  [97.5 F (36.4 C)-99.2 F (37.3 C)] 97.5 F (36.4 C) (01/02 0822) Pulse Rate:  [90-111] 93 (01/02 0609) Cardiac Rhythm: Normal sinus rhythm (01/02 0700) Resp:  [12-30] 21 (01/02 0822) BP: (85-106)/(54-80) 94/64 (01/02 0822) SpO2:  [95 %-100 %] 96 % (01/02 0822) FiO2 (%):  [21 %] 21 % (01/02 0807)      Intake/Output from previous day: 01/01 0701 - 01/02 0700 In: 480 [P.O.:480] Out: -    Physical Exam:  Cardiovascular: RRR Pulmonary: No increased work of breathing Extremities:No lower extremity edema. Wounds: Dressing is wound lean and dry.   Chest Tubes:to water seal, +++ air leak with cough  Lab Results: CBC: Recent Labs    04/17/20 0122 04/18/20 0227  WBC 7.0 5.3  HGB 9.4* 9.6*  HCT 29.5* 29.2*  PLT 441* 419*   BMET:  Recent Labs    04/17/20 0122  NA 137  K 3.8  CL 101  CO2 26  GLUCOSE 124*  BUN 14  CREATININE 0.62  CALCIUM 8.8*    PT/INR: No results for input(s): LABPROT, INR in the last 72 hours. ABG:  INR: Will add last result for INR, ABG once components are confirmed Will add last 4 CBG results once components are confirmed  Assessment/Plan:  1. CV - SR/ST with HR in the 100's this am. On Lopressor 12.5 mg bid. Per primary 2.  Pulmonary - On room air. Chest tubes with no output recorded last 24 hours. Chest tube is to water seal and has a +++ air leak with cough.  This am's CXR appears stable (moderate, residual left pneumothorax and right lung nodule vs abscess). She likely will need another thoracic surgery. Chest tubes to remain. Encourage incentive spirometer. 3. CBGs 105/116/98. On Insulin PRN.  Per primary 4. Anemia-H and H this am decreased to 9.6 and 29.2 5. ID- Given recent findings of TR and per infectious disease,  Transitioned  to oral Cefdinir for 4 weeks for a total of 6 weeks of antibiotic therapy.  Donielle M ZimmermanPA-C 04/18/2020,8:54 AM [phone]   Agree with above Continues to have an air leak Pt has a chronic space Will discuss with Dr. Modena Slater

## 2020-04-18 NOTE — Progress Notes (Signed)
Assessed LAFA PIV.  No signs of problems.  Site CDI. Minimal flinch initially with NS flush which immediately goes away.  10 ml flush without swelling or any continuation of discomfort.  Nadine RN at bedside aware.

## 2020-04-19 ENCOUNTER — Inpatient Hospital Stay (HOSPITAL_COMMUNITY): Payer: Medicaid Other

## 2020-04-19 DIAGNOSIS — J869 Pyothorax without fistula: Secondary | ICD-10-CM | POA: Diagnosis not present

## 2020-04-19 MED ORDER — OXYCODONE HCL 5 MG PO TABS
5.0000 mg | ORAL_TABLET | ORAL | Status: DC | PRN
  Administered 2020-04-19 – 2020-04-28 (×27): 10 mg via ORAL
  Filled 2020-04-19 (×30): qty 2

## 2020-04-19 MED ORDER — HYDROMORPHONE HCL 1 MG/ML IJ SOLN
0.5000 mg | INTRAMUSCULAR | Status: DC | PRN
  Administered 2020-04-19 – 2020-04-26 (×29): 0.5 mg via INTRAVENOUS
  Filled 2020-04-19 (×6): qty 0.5
  Filled 2020-04-19: qty 1
  Filled 2020-04-19 (×15): qty 0.5
  Filled 2020-04-19 (×2): qty 1
  Filled 2020-04-19 (×2): qty 0.5
  Filled 2020-04-19: qty 1
  Filled 2020-04-19: qty 0.5
  Filled 2020-04-19: qty 1
  Filled 2020-04-19: qty 0.5

## 2020-04-19 NOTE — Plan of Care (Signed)
  Problem: Education: Goal: Knowledge of General Education information will improve Description: Including pain rating scale, medication(s)/side effects and non-pharmacologic comfort measures Outcome: Progressing   Problem: Health Behavior/Discharge Planning: Goal: Ability to manage health-related needs will improve Outcome: Progressing   Problem: Clinical Measurements: Goal: Ability to maintain clinical measurements within normal limits will improve Outcome: Progressing Goal: Will remain free from infection Outcome: Progressing Goal: Diagnostic test results will improve Outcome: Progressing Goal: Cardiovascular complication will be avoided Outcome: Progressing   Problem: Activity: Goal: Risk for activity intolerance will decrease Outcome: Progressing   Problem: Nutrition: Goal: Adequate nutrition will be maintained Outcome: Progressing   Problem: Coping: Goal: Level of anxiety will decrease Outcome: Progressing   Problem: Pain Managment: Goal: General experience of comfort will improve Outcome: Progressing   Problem: Safety: Goal: Ability to remain free from injury will improve Outcome: Progressing   Problem: Education: Goal: Knowledge of risk factors and measures for prevention of condition will improve Outcome: Progressing   Problem: Coping: Goal: Psychosocial and spiritual needs will be supported Outcome: Progressing   Problem: Respiratory: Goal: Will maintain a patent airway Outcome: Progressing Goal: Complications related to the disease process, condition or treatment will be avoided or minimized Outcome: Progressing

## 2020-04-19 NOTE — Plan of Care (Signed)
  Problem: Education: Goal: Knowledge of General Education information will improve Description Including pain rating scale, medication(s)/side effects and non-pharmacologic comfort measures Outcome: Progressing   

## 2020-04-19 NOTE — Progress Notes (Addendum)
      [address]       [place] [address]             [phone]       14 Days Post-Op Procedure(s) (LRB): VIDEO ASSISTED THORACOSCOPY (VATS)/EMPYEMA DRAINAGE, DECORTICATION  (Left)  Subjective: Patient sleeping and awakened this am.  Objective: Vital signs in last 24 hours: Temp:  [97.5 F (36.4 C)-98.5 F (36.9 C)] 97.8 F (36.6 C) (01/03 0400) Pulse Rate:  [89-109] 109 (01/02 2138) Cardiac Rhythm: Normal sinus rhythm (01/03 0706) Resp:  [14-34] 16 (01/03 0400) BP: (86-109)/(56-78) 97/69 (01/03 0400) SpO2:  [95 %-100 %] 100 % (01/02 2138) FiO2 (%):  [21 %] 21 % (01/02 1929)      Intake/Output from previous day: 01/02 0701 - 01/03 0700 In: -  Out: 3000 [Urine:3000]   Physical Exam:  Cardiovascular: RRR Pulmonary: No increased work of breathing;Diminished left base Extremities:No lower extremity edema. Wounds: Dressing is wound lean and dry.   Chest Tubes:to water seal, +-++ air leak with cough  Lab Results: CBC: Recent Labs    04/17/20 0122 04/18/20 0227  WBC 7.0 5.3  HGB 9.4* 9.6*  HCT 29.5* 29.2*  PLT 441* 419*   BMET:  Recent Labs    04/17/20 0122  NA 137  K 3.8  CL 101  CO2 26  GLUCOSE 124*  BUN 14  CREATININE 0.62  CALCIUM 8.8*    PT/INR: No results for input(s): LABPROT, INR in the last 72 hours. ABG:  INR: Will add last result for INR, ABG once components are confirmed Will add last 4 CBG results once components are confirmed  Assessment/Plan:  1. CV - SR/ST with HR in the 100's this am. On Lopressor 12.5 mg bid. Per primary 2.  Pulmonary - On room air. Chest tubes with no output recorded last 24 hours. Chest tube is to water seal and has a +-++ air leak with cough.  This am's CXR appears stable (moderate, residual left pneumothorax and right lung nodule vs abscess). Dr. Tyrone Sage to evaluate this am.  Chest tubes to remain. Encourage incentive spirometer. 3. Anemia-Last H and H stable at 9.6 and 29.2 4. ID-  Given recent findings of TR and per infectious disease,  Transitioned  to oral Cefdinir for 4 weeks for a total of 6 weeks of antibiotic therapy.  Donielle M ZimmermanPA-C 04/19/2020,7:10 AM [phone]  Poor reexpansion with suction and air leak Leave chest tubes now - in good position  Will get non contrast ct chest today to better evaluate    Delight Ovens MD Beeper [phone] Office [phone] 04/19/2020 8:44 AM

## 2020-04-19 NOTE — Progress Notes (Signed)
PROGRESS NOTE  Casi Westerfeld PYP:[id number] DOB: 11/25/1985 DOA: 04/01/2020 PCP: Patient, No Pcp Per  Brief History   Patient is a 35 y.o. female with PMHx of IVDA (heroin/methamphetamine)-who tested positive for COVID-19 on 12/11 (home test)-presented to [facility] on 12/16 with dyspnea, cough-found to have a large left-sided pleural effusion-underwent diagnostic thoracocentesis on 12/17-which was consistent with empyema-subsequently seen by CT surgery-and underwent VATS/decortication on 12/20.    The patient's chest tube is still in place. She continues to have significant pain from the chest tube. Narcotic pain control is limited due to the patient's hypotension. Heart rate has also been high. It is sinus tachycardia. Likely at least in part due to pain.  ID has transitioned the patient from IV ceftriaxine and vancomycin to PO Cefdinir for the 4 weeks remaining in her 6 week course of antibiotics.  Per CTS the patient will likely require another thoracic procedure. Non-contast CT chest ordered for today to follow progress. Chest tube to remain in place today.  Consultants  . PCCM . CTS . ID  Procedures  . Thorocentesis . VATS/Decortication  Antibiotics   Anti-infectives (From admission, onward)   Start     Dose/Rate Route Frequency Ordered Stop   04/16/20 1000  cefdinir (OMNICEF) capsule 600 mg        600 mg Oral Daily 04/14/20 1011 05/14/20 0959   04/06/20 1045  cefTRIAXone (ROCEPHIN) 2 g in sodium chloride 0.9 % 100 mL IVPB        2 g 200 mL/hr over 30 Minutes Intravenous Every 24 hours 04/06/20 0945 04/15/20 1726   04/05/20 1100  cefTRIAXone (ROCEPHIN) 2 g in sodium chloride 0.9 % 100 mL IVPB  Status:  Discontinued        2 g 200 mL/hr over 30 Minutes Intravenous Every 24 hours 04/05/20 1023 04/05/20 1453   04/03/20 1600  vancomycin (VANCOREADY) IVPB 750 mg/150 mL  Status:  Discontinued        750 mg 150 mL/hr over 60 Minutes Intravenous Every 12 hours 04/03/20 1500 04/05/20 1020    04/03/20 1000  remdesivir 100 mg in sodium chloride 0.9 % 100 mL IVPB  Status:  Discontinued       "Followed by" Linked Group Details   100 mg 200 mL/hr over 30 Minutes Intravenous Daily 04/02/20 0654 04/02/20 0656   04/03/20 1000  remdesivir 100 mg in sodium chloride 0.9 % 100 mL IVPB       "Followed by" Linked Group Details   100 mg 200 mL/hr over 30 Minutes Intravenous Daily 04/02/20 0658 04/06/20 0948   04/03/20 0400  vancomycin (VANCOREADY) IVPB 500 mg/100 mL  Status:  Discontinued        500 mg 100 mL/hr over 60 Minutes Intravenous Every 12 hours 04/02/20 1459 04/03/20 1500   04/02/20 2200  piperacillin-tazobactam (ZOSYN) IVPB 3.375 g  Status:  Discontinued        3.375 g 12.5 mL/hr over 240 Minutes Intravenous Every 8 hours 04/02/20 1332 04/05/20 1023   04/02/20 1515  vancomycin (VANCOREADY) IVPB 1250 mg/250 mL  Status:  Discontinued        1,250 mg 166.7 mL/hr over 90 Minutes Intravenous Every 24 hours 04/02/20 1449 04/02/20 1449   04/02/20 1515  vancomycin (VANCOREADY) IVPB 1250 mg/250 mL        1,250 mg 166.7 mL/hr over 90 Minutes Intravenous  Once 04/02/20 1449 04/02/20 1701   04/02/20 1345  piperacillin-tazobactam (ZOSYN) IVPB 3.375 g        3.375  g 100 mL/hr over 30 Minutes Intravenous  Once 04/02/20 1332 04/02/20 1434   04/02/20 0900  remdesivir 100 mg in sodium chloride 0.9 % 100 mL IVPB       "Followed by" Linked Group Details   100 mg 200 mL/hr over 30 Minutes Intravenous  Once 04/02/20 0658 04/02/20 1251   04/02/20 0800  remdesivir 100 mg in sodium chloride 0.9 % 100 mL IVPB       "Followed by" Linked Group Details   100 mg 200 mL/hr over 30 Minutes Intravenous  Once 04/02/20 0658 04/02/20 0853   04/02/20 0700  remdesivir 200 mg in sodium chloride 0.9% 250 mL IVPB  Status:  Discontinued       "Followed by" Linked Group Details   200 mg 580 mL/hr over 30 Minutes Intravenous Once 04/02/20 0654 04/02/20 0656      Subjective  The patient is complaining bitterly  of worsening pain in her chest. She is awake and alert.  Objective   Vitals:  Vitals:   04/19/20 0734 04/19/20 0802  BP:  103/75  Pulse:  (!) 104  Resp:  17  Temp:  97.8 F (36.6 C)  SpO2: 98% 99%   Exam:  Constitutional:  The patient is awake, alert, and oriented x 3. No acute distress. Respiratory:  . No increased work of breathing. . No wheezes, rales, or rhonchi . No tactile fremitus . Chest tube is to water seal. There is an air leak with cough. Cardiovascular:  . Regular rate and rhythm . No murmurs, ectopy, or gallups. . No lateral PMI. No thrills. Abdomen:  . Abdomen is soft, non-tender, non-distended . No hernias, masses, or organomegaly . Normoactive bowel sounds.  Musculoskeletal:  . No cyanosis, clubbing, or edema Skin:  . No rashes, lesions, ulcers . palpation of skin: no induration or nodules Neurologic:  . CN 2-12 intact . Sensation all 4 extremities intact Psychiatric:  . Mental status o Mood, affect appropriate o Orientation to person, place, time  . judgment and insight appear intact  I have personally reviewed the following:   Today's Data  . Dispensing optician  . MRSA by PCR is negative. . Blood culture x 2: No growth . Body fluid culture: streptococcus pneumoniae . COVID-19 positive on 03/27/2020 and 04/01/2020. No isolation necessary  Imaging  . CTA chest . CXR . Repeat CT chest 04/19/2020  Cardiology Data  . Echocardiogram  Scheduled Meds: . acidophilus  2 capsule Oral Daily  . albuterol  2 puff Inhalation BID  . bisacodyl  5 mg Oral Daily  . busPIRone  10 mg Oral TID  . cefdinir  600 mg Oral Daily  . Chlorhexidine Gluconate Cloth  6 each Topical Daily  . dextromethorphan-guaiFENesin  1 tablet Oral BID  . enoxaparin (LOVENOX) injection  40 mg Subcutaneous Q24H  . feeding supplement  1 Container Oral BID BM  . feeding supplement  237 mL Oral TID BM  . fentaNYL (SUBLIMAZE) injection  12.5 mcg Intravenous Once  . lidocaine   1 patch Transdermal Q24H  . metoprolol tartrate  12.5 mg Oral BID  . multivitamin with minerals  1 tablet Oral Daily  . nicotine  14 mg Transdermal Daily  . pantoprazole  40 mg Oral Daily  . senna-docusate  2 tablet Oral BID  . traZODone  100 mg Oral QHS   Continuous Infusions: . sodium chloride    . 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  Pleural fluid culture positive for Streptococcus pneumoniae-thankfully blood cultures negative.  Chest tube in place-CT surgery following.  Recommendations from [facility] are for 2 weeks of IV Rocephin from 12/20-followed by 4 weeks of oral antimicrobial therapy (Cefdinir).  The patient may require another thoracic procedure. The patient is now saturating 99% on room air. ID has transitioned the patient from IV ceftriaxine and vancomycin to PO Cefdinir for the 4 weeks remaining in her 6 week course of antibiotics. Follow up CT chest today has demonstrated a large left pneumothorax with small to moderate amount of loculated left pleural fluid. There was improved aeration in the left lung with residual collapse/consolidation in the left upper and left lower lobes. There is a trace of loculated pleural fluid in the minor fissure.  Pneumothorax: Left Lung - large. Management as per CTS.  COVID-19 infection: The patient initially tested positive at home on 03/27/2020 after having symptoms x 1 day. Has been treated  with steroid/Remdesivir. Hypoxia was mostly from empyema-rather than COVID-19 related parenchymal infection. As the patient is clinically  Improved. The patient has been taken out of isolation and moved out of 5W.  Tricuspid valve regurgitation: The patient has received 2 weeks of IV ceftriaxone and vancomycin. ID has now converted her to PO cefdinir to complete the next 4 weeks of therapy.  Pericardial effusion: Seen on echo on 12/18, but resolved on echo on 12/28.    Depression/anorexia: Continue BuSpar as at home.  History of IVDA-heroin/methamphetamine: Noted.  Tobacco abuse: Continue transdermal nicotine.  Severe Protein Calorie Malnutrition: Etiology: acute illness (COVID 19 infection)/IVDA. Signs/Symptoms: moderate fat depletion,severe muscle depletion,energy intake < or equal to 50% for > or equal to 5 days. Interventions: Ensure Enlive (each supplement provides 350kcal and 20 grams of protein),Magic cup.  I have seen and examined this patient myself. I have spent 36 minutes in her evaluation and care.  DVT prophylaxis: Lovenox CODE STATUS: Full Code Family Communication: None available Disposition: Status is: Inpatient  Remains inpatient appropriate because:IV treatments appropriate due to intensity of illness or inability to take PO  Dispo: The patient is from: Home              Anticipated d/c is to: Home              Anticipated d/c date is: > 3 days              Patient currently is not medically stable to d/c.   Fran Lowes, DO [facility] 04/19/2020 1:40 PM

## 2020-04-20 ENCOUNTER — Inpatient Hospital Stay (HOSPITAL_COMMUNITY): Payer: Medicaid Other

## 2020-04-20 DIAGNOSIS — J869 Pyothorax without fistula: Secondary | ICD-10-CM | POA: Diagnosis not present

## 2020-04-20 LAB — BASIC METABOLIC PANEL
Anion gap: 11 (ref 5–15)
BUN: 13 mg/dL (ref 6–20)
CO2: 27 mmol/L (ref 22–32)
Calcium: 8.5 mg/dL — ABNORMAL LOW (ref 8.9–10.3)
Chloride: 100 mmol/L (ref 98–111)
Creatinine, Ser: 0.72 mg/dL (ref 0.44–1.00)
GFR, Estimated: >60 mL/min (ref >60)
Glucose, Bld: 135 mg/dL — ABNORMAL HIGH (ref 70–99)
Potassium: 3.9 mmol/L (ref 3.5–5.1)
Sodium: 138 mmol/L (ref 135–145)

## 2020-04-20 LAB — CBC WITH DIFFERENTIAL/PLATELET
Abs Immature Granulocytes: 0.05 10*3/uL (ref 0.00–0.07)
Basophils Absolute: 0 10*3/uL (ref 0.0–0.1)
Basophils Relative: 1 %
Eosinophils Absolute: 0.1 10*3/uL (ref 0.0–0.5)
Eosinophils Relative: 1 %
HCT: 29.5 % — ABNORMAL LOW (ref 36.0–46.0)
Hemoglobin: 9.6 g/dL — ABNORMAL LOW (ref 12.0–15.0)
Immature Granulocytes: 1 %
Lymphocytes Relative: 32 %
Lymphs Abs: 1.7 10*3/uL (ref 0.7–4.0)
MCH: 28.6 pg (ref 26.0–34.0)
MCHC: 32.5 g/dL (ref 30.0–36.0)
MCV: 87.8 fL (ref 80.0–100.0)
Monocytes Absolute: 0.7 10*3/uL (ref 0.1–1.0)
Monocytes Relative: 13 %
Neutro Abs: 2.7 10*3/uL (ref 1.7–7.7)
Neutrophils Relative %: 52 %
Platelets: 416 10*3/uL — ABNORMAL HIGH (ref 150–400)
RBC: 3.36 MIL/uL — ABNORMAL LOW (ref 3.87–5.11)
RDW: 15.5 % (ref 11.5–15.5)
WBC: 5.2 10*3/uL (ref 4.0–10.5)
nRBC: 0 % (ref 0.0–0.2)

## 2020-04-20 NOTE — Plan of Care (Signed)
  Problem: Education: Goal: Knowledge of General Education information will improve Description: Including pain rating scale, medication(s)/side effects and non-pharmacologic comfort measures Outcome: Progressing   Problem: Health Behavior/Discharge Planning: Goal: Ability to manage health-related needs will improve Outcome: Progressing   Problem: Clinical Measurements: Goal: Ability to maintain clinical measurements within normal limits will improve Outcome: Progressing Goal: Will remain free from infection Outcome: Progressing Goal: Diagnostic test results will improve Outcome: Progressing Goal: Cardiovascular complication will be avoided Outcome: Progressing   Problem: Activity: Goal: Risk for activity intolerance will decrease Outcome: Progressing   Problem: Nutrition: Goal: Adequate nutrition will be maintained Outcome: Progressing   Problem: Coping: Goal: Level of anxiety will decrease Outcome: Progressing   Problem: Pain Managment: Goal: General experience of comfort will improve Outcome: Progressing   Problem: Safety: Goal: Ability to remain free from injury will improve Outcome: Progressing   Problem: Education: Goal: Knowledge of risk factors and measures for prevention of condition will improve Outcome: Progressing   Problem: Coping: Goal: Psychosocial and spiritual needs will be supported Outcome: Progressing   Problem: Respiratory: Goal: Will maintain a patent airway Outcome: Progressing Goal: Complications related to the disease process, condition or treatment will be avoided or minimized Outcome: Progressing   

## 2020-04-20 NOTE — Progress Notes (Signed)
PROGRESS NOTE  Alyssa Vargas WUJ:[id number] DOB: 12-27-1985 DOA: 04/01/2020 PCP: Patient, No Pcp Per  Brief History   Patient is a 35 y.o. female with PMHx of IVDA (heroin/methamphetamine)-who tested positive for COVID-19 on 12/11 (home test)-presented to [facility] on 12/16 with dyspnea, cough-found to have a large left-sided pleural effusion-underwent diagnostic thoracocentesis on 12/17-which was consistent with empyema-subsequently seen by CT surgery-and underwent VATS/decortication on 12/20.    The patient's chest tube is still in place. She continues to have significant pain from the chest tube. Narcotic pain control is limited due to the patient's hypotension. Heart rate has also been high. It is sinus tachycardia. Likely at least in part due to pain.  ID has transitioned the patient from IV ceftriaxine and vancomycin to PO Cefdinir for the 4 weeks remaining in her 6 week course of antibiotics.  Repeat CT chest was performed on 04/20/2019. It has demonstrated a large left pneumothorax with small to moderate amount of loculated left pleural fluid. There was improved aeration in the left lung with residual collapse/consolidation in the left upper and left lower lobes. There is a trace of loculated pleural fluid in the minor fissure.  CTS plans to take the patient back to the OR tomorrow for bronchoscopy, possible VATS/thoracotomy tomorrow.  Consultants  . PCCM . CTS . ID  Procedures  . Thorocentesis . VATS/Decortication  Antibiotics   Anti-infectives (From admission, onward)   Start     Dose/Rate Route Frequency Ordered Stop   04/16/20 1000  cefdinir (OMNICEF) capsule 600 mg        600 mg Oral Daily 04/14/20 1011 05/14/20 0959   04/06/20 1045  cefTRIAXone (ROCEPHIN) 2 g in sodium chloride 0.9 % 100 mL IVPB        2 g 200 mL/hr over 30 Minutes Intravenous Every 24 hours 04/06/20 0945 04/15/20 1726   04/05/20 1100  cefTRIAXone (ROCEPHIN) 2 g in sodium chloride 0.9 % 100 mL IVPB  Status:   Discontinued        2 g 200 mL/hr over 30 Minutes Intravenous Every 24 hours 04/05/20 1023 04/05/20 1453   04/03/20 1600  vancomycin (VANCOREADY) IVPB 750 mg/150 mL  Status:  Discontinued        750 mg 150 mL/hr over 60 Minutes Intravenous Every 12 hours 04/03/20 1500 04/05/20 1020   04/03/20 1000  remdesivir 100 mg in sodium chloride 0.9 % 100 mL IVPB  Status:  Discontinued       "Followed by" Linked Group Details   100 mg 200 mL/hr over 30 Minutes Intravenous Daily 04/02/20 0654 04/02/20 0656   04/03/20 1000  remdesivir 100 mg in sodium chloride 0.9 % 100 mL IVPB       "Followed by" Linked Group Details   100 mg 200 mL/hr over 30 Minutes Intravenous Daily 04/02/20 0658 04/06/20 0948   04/03/20 0400  vancomycin (VANCOREADY) IVPB 500 mg/100 mL  Status:  Discontinued        500 mg 100 mL/hr over 60 Minutes Intravenous Every 12 hours 04/02/20 1459 04/03/20 1500   04/02/20 2200  piperacillin-tazobactam (ZOSYN) IVPB 3.375 g  Status:  Discontinued        3.375 g 12.5 mL/hr over 240 Minutes Intravenous Every 8 hours 04/02/20 1332 04/05/20 1023   04/02/20 1515  vancomycin (VANCOREADY) IVPB 1250 mg/250 mL  Status:  Discontinued        1,250 mg 166.7 mL/hr over 90 Minutes Intravenous Every 24 hours 04/02/20 1449 04/02/20 1449   04/02/20 1515  vancomycin (VANCOREADY) IVPB 1250 mg/250 mL        1,250 mg 166.7 mL/hr over 90 Minutes Intravenous  Once 04/02/20 1449 04/02/20 1701   04/02/20 1345  piperacillin-tazobactam (ZOSYN) IVPB 3.375 g        3.375 g 100 mL/hr over 30 Minutes Intravenous  Once 04/02/20 1332 04/02/20 1434   04/02/20 0900  remdesivir 100 mg in sodium chloride 0.9 % 100 mL IVPB       "Followed by" Linked Group Details   100 mg 200 mL/hr over 30 Minutes Intravenous  Once 04/02/20 0658 04/02/20 1251   04/02/20 0800  remdesivir 100 mg in sodium chloride 0.9 % 100 mL IVPB       "Followed by" Linked Group Details   100 mg 200 mL/hr over 30 Minutes Intravenous  Once 04/02/20 0658  04/02/20 0853   04/02/20 0700  remdesivir 200 mg in sodium chloride 0.9% 250 mL IVPB  Status:  Discontinued       "Followed by" Linked Group Details   200 mg 580 mL/hr over 30 Minutes Intravenous Once 04/02/20 0654 04/02/20 0656      Subjective  The patient is without new complaints. She is dismayed that she will need to return to surgery  Objective   Vitals:  Vitals:   04/20/20 0330 04/20/20 0748  BP: 94/62   Pulse: 94   Resp: 16   Temp: 98 F (36.7 C)   SpO2: 98% 97%   Exam:  Constitutional:  The patient is awake, alert, and oriented x 3. No acute distress. Respiratory:  . No increased work of breathing. . No wheezes, rales, or rhonchi . No tactile fremitus . Chest tube is to water seal. There is an air leak with cough. Cardiovascular:  . Regular rate and rhythm . No murmurs, ectopy, or gallups. . No lateral PMI. No thrills. Abdomen:  . Abdomen is soft, non-tender, non-distended . No hernias, masses, or organomegaly . Normoactive bowel sounds.  Musculoskeletal:  . No cyanosis, clubbing, or edema Skin:  . No rashes, lesions, ulcers . palpation of skin: no induration or nodules Neurologic:  . CN 2-12 intact . Sensation all 4 extremities intact Psychiatric:  . Mental status o Mood, affect appropriate o Orientation to person, place, time  . judgment and insight appear intact  I have personally reviewed the following:   Today's Data  . Vitals, CBC, BMP  Micro Data  . MRSA by PCR is negative. . Blood culture x 2: No growth . Body fluid culture: streptococcus pneumoniae . COVID-19 positive on 03/27/2020 and 04/01/2020. No isolation necessary  Imaging  . CTA chest . CXR . Repeat CT chest 04/19/2020  Cardiology Data  . Echocardiogram  Scheduled Meds: . acidophilus  2 capsule Oral Daily  . albuterol  2 puff Inhalation BID  . bisacodyl  5 mg Oral Daily  . busPIRone  10 mg Oral TID  . cefdinir  600 mg Oral Daily  . Chlorhexidine Gluconate Cloth  6  each Topical Daily  . dextromethorphan-guaiFENesin  1 tablet Oral BID  . enoxaparin (LOVENOX) injection  40 mg Subcutaneous Q24H  . feeding supplement  1 Container Oral BID BM  . feeding supplement  237 mL Oral TID BM  . fentaNYL (SUBLIMAZE) injection  12.5 mcg Intravenous Once  . lidocaine  1 patch Transdermal Q24H  . metoprolol tartrate  12.5 mg Oral BID  . multivitamin with minerals  1 tablet Oral Daily  . nicotine  14 mg Transdermal Daily  .  pantoprazole  40 mg Oral Daily  . senna-docusate  2 tablet Oral BID  . traZODone  100 mg Oral QHS   Continuous Infusions: . sodium chloride    . 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  Pleural fluid culture positive for Streptococcus pneumoniae-thankfully blood cultures negative.  Chest tube in place-CT surgery following.  Recommendations from [facility] are for 2 weeks of IV Rocephin from 12/20-followed by 4 weeks of oral antimicrobial therapy (Cefdinir).  The patient may require another thoracic procedure. The patient is now saturating 99% on room air. ID has transitioned the patient from IV ceftriaxine and vancomycin to PO Cefdinir for the 4 weeks remaining in her 6 week course of antibiotics. Follow up CT chest today has demonstrated a large left pneumothorax with small to moderate amount of loculated left pleural fluid. There was improved aeration in the left lung with residual collapse/consolidation  in the left upper and left lower lobes. There is a trace of loculated pleural fluid in the minor fissure. CTS plans to return the patient to the OR tomorrow for bronchoscopy, VATS, thoracotomy.  Pneumothorax: Left Lung - large. Management as per CTS.  COVID-19 infection: The patient initially tested positive at home on 03/27/2020 after having symptoms x 1 day. Has been treated with steroid/Remdesivir. Hypoxia was mostly from empyema-rather than COVID-19 related parenchymal infection. As the patient is clinically  Improved. The patient has been taken out of isolation and moved out of 5W.  Tricuspid valve regurgitation: The patient has received 2 weeks of IV ceftriaxone and vancomycin. ID has now converted her to PO cefdinir to complete the next 4 weeks of therapy.  Pericardial effusion: Seen on echo on 12/18, but resolved on echo on 12/28.    Depression/anorexia: Continue BuSpar as at home.  History of IVDA-heroin/methamphetamine: Noted.  Tobacco abuse: Continue transdermal nicotine.  Severe Protein Calorie Malnutrition: Etiology: acute illness (COVID 19 infection)/IVDA. Signs/Symptoms: moderate fat depletion,severe muscle depletion,energy intake < or equal to 50% for > or equal to 5 days. Interventions: Ensure Enlive (each supplement provides 350kcal and 20 grams of protein),Magic cup.  I have seen and examined this patient myself. I have spent 32 minutes in her evaluation and care.  DVT prophylaxis: Lovenox CODE STATUS: Full Code Family Communication: None available Disposition: Status is: Inpatient  Remains inpatient appropriate because:IV treatments appropriate due to intensity of illness or inability to take PO  Dispo: The patient is from: Home              Anticipated d/c is to: Home              Anticipated d/c date is: > 3 days              Patient currently is not medically stable to d/c.   Fran Lowes, DO [facility] 04/20/2020 3:12  PM

## 2020-04-20 NOTE — Progress Notes (Addendum)
[address]       [place] [id number]             [phone]      15 Days Post-Op Procedure(s) (LRB): VIDEO ASSISTED THORACOSCOPY (VATS)/EMPYEMA DRAINAGE, DECORTICATION  (Left) Subjective: No SOB, some pain with cough  Objective: Vital signs in last 24 hours: Temp:  [97.8 F (36.6 C)-98.9 F (37.2 C)] 98 F (36.7 C) (01/04 0330) Pulse Rate:  [94-115] 94 (01/04 0330) Cardiac Rhythm: Sinus tachycardia (01/03 1900) Resp:  [16-20] 16 (01/04 0330) BP: (94-103)/(59-75) 94/62 (01/04 0330) SpO2:  [97 %-100 %] 98 % (01/04 0330) FiO2 (%):  [21 %] 21 % (01/03 2035)  Hemodynamic parameters for last 24 hours:    Intake/Output from previous day: No intake/output data recorded. Intake/Output this shift: No intake/output data recorded.  General appearance: alert, cooperative and no distress Heart: regular rate and rhythm Lungs: dim left lower fields  Lab Results: Recent Labs    04/18/20 0227 04/20/20 0359  WBC 5.3 5.2  HGB 9.6* 9.6*  HCT 29.2* 29.5*  PLT 419* 416*   BMET:  Recent Labs    04/20/20 0359  NA 138  K 3.9  CL 100  CO2 27  GLUCOSE 135*  BUN 13  CREATININE 0.72  CALCIUM 8.5*    PT/INR: No results for input(s): LABPROT, INR in the last 72 hours. ABG No results found for: PHART, HCO3, TCO2, ACIDBASEDEF, O2SAT CBG (last 3)  Recent Labs    04/17/20 1002 04/17/20 1209 04/17/20 1649  GLUCAP 105* 116* 98    Meds Scheduled Meds: . acidophilus  2 capsule Oral Daily  . albuterol  2 puff Inhalation BID  . bisacodyl  5 mg Oral Daily  . busPIRone  10 mg Oral TID  . cefdinir  600 mg Oral Daily  . Chlorhexidine Gluconate Cloth  6 each Topical Daily  . dextromethorphan-guaiFENesin  1 tablet Oral BID  . enoxaparin (LOVENOX) injection  40 mg Subcutaneous Q24H  . feeding supplement  1 Container Oral BID BM  . feeding supplement  237 mL Oral TID BM  . fentaNYL (SUBLIMAZE) injection  12.5 mcg Intravenous Once  . lidocaine  1 patch  Transdermal Q24H  . metoprolol tartrate  12.5 mg Oral BID  . multivitamin with minerals  1 tablet Oral Daily  . nicotine  14 mg Transdermal Daily  . pantoprazole  40 mg Oral Daily  . senna-docusate  2 tablet Oral BID  . traZODone  100 mg Oral QHS   Continuous Infusions: . sodium chloride    . sodium chloride 10 mL/hr at 04/07/20 1600   PRN Meds:.Place/Maintain arterial line **AND** sodium chloride, albuterol, ALPRAZolam, cyclobenzaprine, guaiFENesin-dextromethorphan, HYDROmorphone (DILAUDID) injection, hydrOXYzine, ondansetron (ZOFRAN) IV, oxyCODONE, sodium chloride flush, zolpidem  Xrays CT CHEST WO CONTRAST  Result Date: 04/19/2020 CLINICAL DATA:  Pneumothorax, chest tube placement. EXAM: CT CHEST WITHOUT CONTRAST TECHNIQUE: Multidetector CT imaging of the chest was performed following the standard protocol without IV contrast. COMPARISON:  Chest radiograph 04/19/2020 and CT chest 04/02/2020. FINDINGS: Cardiovascular: Vascular structures are unremarkable. Heart size normal. No pericardial effusion. Mediastinum/Nodes: No pathologically enlarged mediastinal or axillary lymph nodes. Hilar regions are difficult to evaluate without IV contrast. Esophagus is grossly unremarkable. Lungs/Pleura: 2 left chest tubes terminate in the apical left pleural space. Moderate to large left pneumothorax and small to moderate amount of partially loculated left pleural fluid with pleural thickening. Improved aeration in the left lung with residual collapse/consolidation primarily in  the left upper lobe with mild involvement of the left lower lobe. Trace amount of loculated pleural fluid along the minor fissure, as before. Airway is unremarkable. Upper Abdomen: Visualized portions of the liver, adrenal glands, right kidney, spleen and stomach are grossly unremarkable. Musculoskeletal: None. IMPRESSION: 1. Moderate to large left pneumothorax with a small to moderate amount of loculated left pleural fluid and 2 left chest  tubes in place, status post empyema decortication. 2. Improved aeration in the left lung with residual collapse/consolidation in the left upper and left lower lobes. 3. Trace loculated pleural fluid in the minor fissure, similar. Electronically Signed   By: Leanna Battles M.D.   On: 04/19/2020 10:22   DG CHEST PORT 1 VIEW  Result Date: 04/20/2020 CLINICAL DATA:  Pneumothorax, COVID pneumonia EXAM: PORTABLE CHEST 1 VIEW COMPARISON:  04/19/2020 FINDINGS: Two large bore chest tubes are again seen within the left hemithorax. Large left hydropneumothorax persists, likely representing a pneumothorax ex vacuo. There is persistent collapse of the lingula and left lower lobe with thickening of the pleura suggesting a underlying pleural rind. The right lung is clear. No pneumothorax or pleural effusion. Cardiac size within normal limits IMPRESSION: Stable examination. Left chest tubes in place with persistent large left hydropneumothorax, likely related to entrapment of the lingula and left lower lobe. Electronically Signed   By: Helyn Numbers MD   On: 04/20/2020 06:28   DG CHEST PORT 1 VIEW  Result Date: 04/19/2020 CLINICAL DATA:  Pneumothorax. EXAM: PORTABLE CHEST 1 VIEW COMPARISON:  04/18/2020 FINDINGS: 0617 hours. 2 left chest tubes again noted with similar appearance of the left pneumothorax with pleural thickening/loculated pleural effusion. Nodular densities in the right mid to lower lung are similar. Heart size stable. IMPRESSION: Stable exam. Electronically Signed   By: Kennith Center M.D.   On: 04/19/2020 07:15    Assessment/Plan: S/P Procedure(s) (LRB): VIDEO ASSISTED THORACOSCOPY (VATS)/EMPYEMA DRAINAGE, DECORTICATION  (Left)   1 afeb, VSS, low rel SBP but stable 2 sats good on RA 3 CT scan to be reviewed by MD for next steps- CXR today is stable, CT no air leak. No drainage recorded recently 4 no leukocytosis 5 stable renal fxn 6 stable anemia   LOS: 18 days    Rowe Clack PA-C Pager  [phone] 04/20/2020  Discussed with patient the ct findings and  persistent  leak  From chest tubes and failure of expansion of lower lobe  I have discussed with her the treatment options and have recommended that we proceed with bronchoscopy, Left VATS, poss thoracotomy decortication, poss endobronchial valves  The goals risks and alternatives of the planned surgical procedure Procedure(s): VIDEO ASSISTED THORACOSCOPY (VATS)/EMPYEMA DRAINAGE, DECORTICATION  (Left)  have been discussed with the patient in detail. The risks of the procedure including death, infection, stroke, myocardial infarction, bleeding, blood transfusion have all been discussed specifically.  I have quoted Alyssa Vargas a 2 % of perioperative mortality and a complication rate as high as 30  %. The patient's questions have been answered.Alyssa Vargas is willing  to proceed with the planned procedure.

## 2020-04-21 ENCOUNTER — Encounter (HOSPITAL_COMMUNITY): Payer: Self-pay | Admitting: Family Medicine

## 2020-04-21 ENCOUNTER — Encounter (HOSPITAL_COMMUNITY)
Admission: EM | Disposition: A | Discharge status: Home / Self Care | Payer: Self-pay | Source: Home / Self Care | Attending: Internal Medicine

## 2020-04-21 ENCOUNTER — Inpatient Hospital Stay (HOSPITAL_COMMUNITY): Payer: Medicaid Other

## 2020-04-21 ENCOUNTER — Inpatient Hospital Stay (HOSPITAL_COMMUNITY): Payer: Medicaid Other | Admitting: Certified Registered Nurse Anesthetist

## 2020-04-21 DIAGNOSIS — Z8616 Personal history of COVID-19: Secondary | ICD-10-CM

## 2020-04-21 DIAGNOSIS — J95812 Postprocedural air leak: Secondary | ICD-10-CM

## 2020-04-21 DIAGNOSIS — J869 Pyothorax without fistula: Secondary | ICD-10-CM

## 2020-04-21 HISTORY — PX: VIDEO ASSISTED THORACOSCOPY (VATS)/THOROCOTOMY: SHX6173

## 2020-04-21 HISTORY — PX: INSERTION OF IBV VALVE: SHX5091

## 2020-04-21 HISTORY — PX: CHEST TUBE INSERTION: SHX231

## 2020-04-21 HISTORY — PX: VIDEO BRONCHOSCOPY: SHX5072

## 2020-04-21 HISTORY — PX: INTERCOSTAL NERVE BLOCK: SHX5021

## 2020-04-21 LAB — TYPE AND SCREEN
ABO/RH(D): A POS
Antibody Screen: NEGATIVE

## 2020-04-21 SURGERY — BRONCHOSCOPY, VIDEO-ASSISTED
Anesthesia: General | Site: Chest | Laterality: Left

## 2020-04-21 MED ORDER — PROPOFOL 10 MG/ML IV BOLUS
INTRAVENOUS | Status: AC
  Filled 2020-04-21: qty 40

## 2020-04-21 MED ORDER — STERILE WATER FOR IRRIGATION IR SOLN
Status: DC | PRN
  Administered 2020-04-21: 2000 mL

## 2020-04-21 MED ORDER — CHLORHEXIDINE GLUCONATE CLOTH 2 % EX PADS: 6.0000 | MEDICATED_PAD | Freq: Every day | CUTANEOUS | Status: DC

## 2020-04-21 MED ORDER — PHENYLEPHRINE 40 MCG/ML (10ML) SYRINGE FOR IV PUSH (FOR BLOOD PRESSURE SUPPORT)
PREFILLED_SYRINGE | INTRAVENOUS | Status: AC
  Filled 2020-04-21: qty 10

## 2020-04-21 MED ORDER — LACTATED RINGERS IV SOLN: INTRAVENOUS | Status: DC | PRN

## 2020-04-21 MED ORDER — LIDOCAINE 2% (20 MG/ML) 5 ML SYRINGE
INTRAMUSCULAR | Status: AC
  Filled 2020-04-21: qty 5

## 2020-04-21 MED ORDER — ACETAMINOPHEN 500 MG PO TABS
1000.0000 mg | ORAL_TABLET | Freq: Four times a day (QID) | ORAL | Status: AC
  Administered 2020-04-21 – 2020-04-26 (×18): 1000 mg via ORAL
  Filled 2020-04-21 (×18): qty 2

## 2020-04-21 MED ORDER — OXYCODONE HCL 5 MG PO TABS: 5.0000 mg | ORAL_TABLET | Freq: Once | ORAL | Status: DC | PRN

## 2020-04-21 MED ORDER — ROCURONIUM BROMIDE 10 MG/ML (PF) SYRINGE
PREFILLED_SYRINGE | INTRAVENOUS | Status: AC
  Filled 2020-04-21: qty 10

## 2020-04-21 MED ORDER — ROCURONIUM BROMIDE 10 MG/ML (PF) SYRINGE
PREFILLED_SYRINGE | INTRAVENOUS | Status: DC | PRN
  Administered 2020-04-21: 70 mg via INTRAVENOUS
  Administered 2020-04-21 (×3): 20 mg via INTRAVENOUS
  Administered 2020-04-21: 30 mg via INTRAVENOUS

## 2020-04-21 MED ORDER — MIDAZOLAM HCL 2 MG/2ML IJ SOLN: 0.5000 mg | Freq: Once | INTRAMUSCULAR | Status: DC | PRN

## 2020-04-21 MED ORDER — CEFAZOLIN SODIUM-DEXTROSE 2-4 GM/100ML-% IV SOLN
2.0000 g | Freq: Three times a day (TID) | INTRAVENOUS | Status: AC
  Administered 2020-04-21: 2 g via INTRAVENOUS
  Filled 2020-04-21: qty 100

## 2020-04-21 MED ORDER — MIDAZOLAM HCL 2 MG/2ML IJ SOLN
INTRAMUSCULAR | Status: AC
  Filled 2020-04-21: qty 2

## 2020-04-21 MED ORDER — DEXAMETHASONE SODIUM PHOSPHATE 10 MG/ML IJ SOLN
INTRAMUSCULAR | Status: AC
  Filled 2020-04-21: qty 1

## 2020-04-21 MED ORDER — HYDROMORPHONE HCL 1 MG/ML IJ SOLN
0.2500 mg | INTRAMUSCULAR | Status: DC | PRN
  Administered 2020-04-21 (×3): 0.5 mg via INTRAVENOUS

## 2020-04-21 MED ORDER — HYDROMORPHONE HCL 1 MG/ML IJ SOLN
INTRAMUSCULAR | Status: AC
  Filled 2020-04-21: qty 0.5

## 2020-04-21 MED ORDER — KETAMINE HCL 10 MG/ML IJ SOLN
INTRAMUSCULAR | Status: DC | PRN
  Administered 2020-04-21: 30 mg via INTRAVENOUS
  Administered 2020-04-21: 70 mg via INTRAVENOUS

## 2020-04-21 MED ORDER — CEFDINIR 300 MG PO CAPS
600.0000 mg | ORAL_CAPSULE | Freq: Every day | ORAL | Status: DC
  Administered 2020-04-22 – 2020-04-30 (×9): 600 mg via ORAL
  Filled 2020-04-21 (×9): qty 2

## 2020-04-21 MED ORDER — FENTANYL CITRATE (PF) 250 MCG/5ML IJ SOLN
INTRAMUSCULAR | Status: AC
  Filled 2020-04-21: qty 5

## 2020-04-21 MED ORDER — 0.9 % SODIUM CHLORIDE (POUR BTL) OPTIME
TOPICAL | Status: DC | PRN
  Administered 2020-04-21: 2000 mL
  Administered 2020-04-21: 1000 mL

## 2020-04-21 MED ORDER — HYDROMORPHONE HCL 1 MG/ML IJ SOLN
INTRAMUSCULAR | Status: AC
  Administered 2020-04-21: 0.5 mg via INTRAVENOUS
  Filled 2020-04-21: qty 1

## 2020-04-21 MED ORDER — PROPOFOL 10 MG/ML IV BOLUS
INTRAVENOUS | Status: DC | PRN
  Administered 2020-04-21: 120 mg via INTRAVENOUS

## 2020-04-21 MED ORDER — ACETAMINOPHEN 10 MG/ML IV SOLN
INTRAVENOUS | Status: AC
  Administered 2020-04-21: 1000 mg via INTRAVENOUS
  Filled 2020-04-21: qty 100

## 2020-04-21 MED ORDER — SODIUM CHLORIDE (PF) 0.9 % IJ SOLN
INTRAMUSCULAR | Status: AC
  Filled 2020-04-21: qty 10

## 2020-04-21 MED ORDER — BUPIVACAINE HCL (PF) 0.5 % IJ SOLN
INTRAMUSCULAR | Status: DC | PRN
  Administered 2020-04-21: 10 mL

## 2020-04-21 MED ORDER — ONDANSETRON HCL 4 MG/2ML IJ SOLN
INTRAMUSCULAR | Status: AC
  Filled 2020-04-21: qty 2

## 2020-04-21 MED ORDER — DEXMEDETOMIDINE (PRECEDEX) IN NS 20 MCG/5ML (4 MCG/ML) IV SYRINGE
PREFILLED_SYRINGE | INTRAVENOUS | Status: AC
  Filled 2020-04-21: qty 5

## 2020-04-21 MED ORDER — ENOXAPARIN SODIUM 40 MG/0.4ML ~~LOC~~ SOLN
40.0000 mg | SUBCUTANEOUS | Status: DC
  Filled 2020-04-21 (×3): qty 0.4

## 2020-04-21 MED ORDER — ACETAMINOPHEN 10 MG/ML IV SOLN: 1000.0000 mg | Freq: Once | INTRAVENOUS | Status: DC | PRN

## 2020-04-21 MED ORDER — MIDAZOLAM HCL 2 MG/2ML IJ SOLN: 2.0000 mg | Freq: Once | INTRAMUSCULAR | Status: DC

## 2020-04-21 MED ORDER — BUPIVACAINE LIPOSOME 1.3 % IJ SUSP
20.0000 mL | INTRAMUSCULAR | Status: AC
  Administered 2020-04-21: 8.5 mL
  Filled 2020-04-21: qty 20

## 2020-04-21 MED ORDER — FENTANYL CITRATE (PF) 250 MCG/5ML IJ SOLN
INTRAMUSCULAR | Status: DC | PRN
  Administered 2020-04-21 (×8): 50 ug via INTRAVENOUS
  Administered 2020-04-21: 100 ug via INTRAVENOUS

## 2020-04-21 MED ORDER — DEXMEDETOMIDINE (PRECEDEX) IN NS 20 MCG/5ML (4 MCG/ML) IV SYRINGE
PREFILLED_SYRINGE | INTRAVENOUS | Status: DC | PRN
  Administered 2020-04-21: 4 ug via INTRAVENOUS
  Administered 2020-04-21 (×2): 8 ug via INTRAVENOUS

## 2020-04-21 MED ORDER — DEXAMETHASONE SODIUM PHOSPHATE 10 MG/ML IJ SOLN
INTRAMUSCULAR | Status: DC | PRN
  Administered 2020-04-21: 4 mg via INTRAVENOUS

## 2020-04-21 MED ORDER — MIDAZOLAM HCL 5 MG/5ML IJ SOLN
INTRAMUSCULAR | Status: DC | PRN
  Administered 2020-04-21 (×2): 1 mg via INTRAVENOUS

## 2020-04-21 MED ORDER — POTASSIUM CHLORIDE IN NACL 20-0.9 MEQ/L-% IV SOLN
INTRAVENOUS | Status: DC
  Filled 2020-04-21 (×2): qty 1000

## 2020-04-21 MED ORDER — SUGAMMADEX SODIUM 200 MG/2ML IV SOLN
INTRAVENOUS | Status: DC | PRN
  Administered 2020-04-21: 200 mg via INTRAVENOUS

## 2020-04-21 MED ORDER — HYDROMORPHONE HCL 1 MG/ML IJ SOLN
INTRAMUSCULAR | Status: DC | PRN
Start: 2020-04-21 — End: 2020-04-21
  Administered 2020-04-21 (×2): .5 mg via INTRAVENOUS

## 2020-04-21 MED ORDER — KETAMINE HCL 50 MG/5ML IJ SOSY
PREFILLED_SYRINGE | INTRAMUSCULAR | Status: AC
  Filled 2020-04-21: qty 10

## 2020-04-21 MED ORDER — CEFAZOLIN SODIUM 1 G IJ SOLR
INTRAMUSCULAR | Status: AC
  Filled 2020-04-21: qty 20

## 2020-04-21 MED ORDER — OXYCODONE HCL 5 MG/5ML PO SOLN: 5.0000 mg | Freq: Once | ORAL | Status: DC | PRN

## 2020-04-21 MED ORDER — HYDROMORPHONE HCL 1 MG/ML IJ SOLN
INTRAMUSCULAR | Status: AC
  Administered 2020-04-21: 0.5 mg via INTRAVENOUS
  Filled 2020-04-21: qty 2

## 2020-04-21 MED ORDER — CYCLOBENZAPRINE HCL 10 MG PO TABS
ORAL_TABLET | ORAL | Status: AC
  Administered 2020-04-21: 5 mg via ORAL
  Filled 2020-04-21: qty 1

## 2020-04-21 MED ORDER — HEMOSTATIC AGENTS (NO CHARGE) OPTIME
TOPICAL | Status: DC | PRN
  Administered 2020-04-21: 1 via TOPICAL

## 2020-04-21 MED ORDER — BUPIVACAINE HCL (PF) 0.5 % IJ SOLN
INTRAMUSCULAR | Status: AC
  Filled 2020-04-21: qty 30

## 2020-04-21 MED ORDER — PHENYLEPHRINE 40 MCG/ML (10ML) SYRINGE FOR IV PUSH (FOR BLOOD PRESSURE SUPPORT)
PREFILLED_SYRINGE | INTRAVENOUS | Status: DC | PRN
  Administered 2020-04-21 (×3): 80 ug via INTRAVENOUS

## 2020-04-21 MED ORDER — CEFAZOLIN SODIUM-DEXTROSE 2-3 GM-%(50ML) IV SOLR
INTRAVENOUS | Status: DC | PRN
  Administered 2020-04-21: 2 g via INTRAVENOUS

## 2020-04-21 MED ORDER — ONDANSETRON HCL 4 MG/2ML IJ SOLN
INTRAMUSCULAR | Status: DC | PRN
  Administered 2020-04-21: 4 mg via INTRAVENOUS

## 2020-04-21 MED ORDER — LIDOCAINE 2% (20 MG/ML) 5 ML SYRINGE
INTRAMUSCULAR | Status: DC | PRN
  Administered 2020-04-21: 100 mg via INTRAVENOUS

## 2020-04-21 MED ORDER — ACETAMINOPHEN 160 MG/5ML PO SOLN: 1000.0000 mg | Freq: Four times a day (QID) | ORAL | Status: AC

## 2020-04-21 MED ORDER — SODIUM CHLORIDE (PF) 0.9 % IJ SOLN
INTRAMUSCULAR | Status: DC | PRN
  Administered 2020-04-21: 21.5 mL

## 2020-04-21 MED ORDER — HYDROMORPHONE HCL 1 MG/ML IJ SOLN
0.2500 mg | INTRAMUSCULAR | Status: DC | PRN
  Administered 2020-04-21: 0.5 mg via INTRAVENOUS

## 2020-04-21 MED ORDER — PHENYLEPHRINE HCL-NACL 10-0.9 MG/250ML-% IV SOLN
INTRAVENOUS | Status: DC | PRN
  Administered 2020-04-21: 25 ug/min via INTRAVENOUS

## 2020-04-21 MED ORDER — SODIUM CHLORIDE 0.9 % IV SOLN: INTRAVENOUS | Status: DC | PRN

## 2020-04-21 SURGICAL SUPPLY — 125 items
ADAPTER VALVE BIOPSY EBUS (MISCELLANEOUS) IMPLANT
ADPTR VALVE BIOPSY EBUS (MISCELLANEOUS)
APPLICATOR COTTON TIP 6 STRL (MISCELLANEOUS) ×6 IMPLANT
APPLICATOR COTTON TIP 6IN STRL (MISCELLANEOUS) ×8 IMPLANT
APPLICATOR TIP EXT COSEAL (VASCULAR PRODUCTS) IMPLANT
BLADE CLIPPER SURG (BLADE) IMPLANT
BRUSH CYTOL CELLEBRITY 1.5X140 (MISCELLANEOUS) IMPLANT
CANISTER SUCT 3000ML PPV (MISCELLANEOUS) ×4 IMPLANT
CATH BALLN 4FR (CATHETERS) ×8 IMPLANT
CATH EMB 4FR 80CM (CATHETERS) IMPLANT
CATH EMB 5FR 80CM (CATHETERS) IMPLANT
CATH EMB 6FR 80CM (CATHETERS) ×4 IMPLANT
CATH LOADER DEPLOYMENT HUD (CATHETERS) ×4 IMPLANT
CATH THORACIC 28FR (CATHETERS) ×4 IMPLANT
CATH THORACIC 36FR (CATHETERS) IMPLANT
CATH THORACIC 36FR RT ANG (CATHETERS) IMPLANT
CLEANER TIP ELECTROSURG 2X2 (MISCELLANEOUS) ×4 IMPLANT
CLIP VESOCCLUDE MED 24/CT (CLIP) ×4 IMPLANT
CLIP VESOCCLUDE MED 6/CT (CLIP) IMPLANT
CNTNR URN SCR LID CUP LEK RST (MISCELLANEOUS) ×15 IMPLANT
CONN ST 1/4X3/8  BEN (MISCELLANEOUS) ×1
CONN ST 1/4X3/8 BEN (MISCELLANEOUS) ×3 IMPLANT
CONN Y 3/8X3/8X3/8  BEN (MISCELLANEOUS)
CONN Y 3/8X3/8X3/8 BEN (MISCELLANEOUS) IMPLANT
CONT SPEC 4OZ STRL OR WHT (MISCELLANEOUS) ×5
COVER BACK TABLE 60X90IN (DRAPES) ×4 IMPLANT
COVER SURGICAL LIGHT HANDLE (MISCELLANEOUS) ×4 IMPLANT
DERMABOND ADVANCED (GAUZE/BANDAGES/DRESSINGS)
DERMABOND ADVANCED .7 DNX12 (GAUZE/BANDAGES/DRESSINGS) IMPLANT
DISSECTOR BLUNT TIP ENDO 5MM (MISCELLANEOUS) IMPLANT
DRAIN CHANNEL 28F RND 3/8 FF (WOUND CARE) ×4 IMPLANT
DRAPE LAPAROSCOPIC ABDOMINAL (DRAPES) ×4 IMPLANT
DRAPE SLUSH MACHINE 52X66 (DRAPES) ×4 IMPLANT
DRAPE TABLE BACK 80X90 (DRAPES) IMPLANT
DRSG AQUACEL AG ADV 3.5X 6 (GAUZE/BANDAGES/DRESSINGS) ×4 IMPLANT
DRSG AQUACEL AG ADV 3.5X14 (GAUZE/BANDAGES/DRESSINGS) IMPLANT
DRSG MEPILEX BORDER 4X4 (GAUZE/BANDAGES/DRESSINGS) ×4 IMPLANT
ELECT BLADE 4.0 EZ CLEAN MEGAD (MISCELLANEOUS) ×4
ELECT BLADE 6.5 EXT (BLADE) ×4 IMPLANT
ELECT REM PT RETURN 9FT ADLT (ELECTROSURGICAL) ×4
ELECTRODE BLDE 4.0 EZ CLN MEGD (MISCELLANEOUS) ×3 IMPLANT
ELECTRODE REM PT RTRN 9FT ADLT (ELECTROSURGICAL) ×3 IMPLANT
FILTER SMOKE EVAC ULPA (FILTER) ×4 IMPLANT
FILTER STRAW FLUID ASPIR (MISCELLANEOUS) IMPLANT
FORCEPS BIOP RJ4 1.8 (CUTTING FORCEPS) IMPLANT
GAUZE KITTNER 4X5 RF (MISCELLANEOUS) ×4 IMPLANT
GAUZE SPONGE 4X4 12PLY STRL (GAUZE/BANDAGES/DRESSINGS) ×4 IMPLANT
GAUZE SPONGE 4X4 12PLY STRL LF (GAUZE/BANDAGES/DRESSINGS) ×4 IMPLANT
GLOVE BIO SURGEON STRL SZ 6.5 (GLOVE) ×8 IMPLANT
GLOVE SURG UNDER POLY LF SZ6.5 (GLOVE) ×24 IMPLANT
GOWN STRL REUS W/ TWL LRG LVL3 (GOWN DISPOSABLE) ×21 IMPLANT
GOWN STRL REUS W/TWL LRG LVL3 (GOWN DISPOSABLE) ×7
HEMOSTAT SURGICEL 2X14 (HEMOSTASIS) ×4 IMPLANT
KIT AIRWAY SIZING HUD (KITS) ×4 IMPLANT
KIT BASIN OR (CUSTOM PROCEDURE TRAY) ×4 IMPLANT
KIT CLEAN ENDO COMPLIANCE (KITS) ×4 IMPLANT
KIT SUCTION CATH 14FR (SUCTIONS) ×4 IMPLANT
KIT TURNOVER KIT B (KITS) ×4 IMPLANT
MARKER SKIN DUAL TIP RULER LAB (MISCELLANEOUS) ×8 IMPLANT
NEEDLE SPNL 18GX3.5 QUINCKE PK (NEEDLE) ×4 IMPLANT
NS IRRIG 1000ML POUR BTL (IV SOLUTION) ×16 IMPLANT
OIL SILICONE PENTAX (PARTS (SERVICE/REPAIRS)) ×4 IMPLANT
PACK CHEST (CUSTOM PROCEDURE TRAY) ×4 IMPLANT
PAD ARMBOARD 7.5X6 YLW CONV (MISCELLANEOUS) ×8 IMPLANT
PASSER SUT SWANSON 36MM LOOP (INSTRUMENTS) IMPLANT
PENCIL SMOKE EVACUATOR (MISCELLANEOUS) ×4 IMPLANT
SCISSORS LAP 5X35 DISP (ENDOMECHANICALS) IMPLANT
SEALANT PROGEL (MISCELLANEOUS) IMPLANT
SEALANT SURG COSEAL 4ML (VASCULAR PRODUCTS) IMPLANT
SEALANT SURG COSEAL 8ML (VASCULAR PRODUCTS) IMPLANT
SLEEVE SUCTION 125 (MISCELLANEOUS) ×4 IMPLANT
SOL ANTI FOG 6CC (MISCELLANEOUS) ×3 IMPLANT
SOLUTION ANTI FOG 6CC (MISCELLANEOUS) ×1
STAPLER VISISTAT 35W (STAPLE) ×4 IMPLANT
STOPCOCK 4 WAY LG BORE MALE ST (IV SETS) ×4 IMPLANT
STOPCOCK MORSE 400PSI 3WAY (MISCELLANEOUS) ×4 IMPLANT
SUT ETHILON 3 0 FSL (SUTURE) ×4 IMPLANT
SUT PROLENE 3 0 SH 48 (SUTURE) ×4 IMPLANT
SUT PROLENE 3 0 SH DA (SUTURE) IMPLANT
SUT PROLENE 4 0 RB 1 (SUTURE)
SUT PROLENE 4-0 RB1 .5 CRCL 36 (SUTURE) IMPLANT
SUT SILK  1 MH (SUTURE) ×3
SUT SILK 1 MH (SUTURE) ×9 IMPLANT
SUT SILK 1 TIES 10X30 (SUTURE) IMPLANT
SUT SILK 2 0SH CR/8 30 (SUTURE) IMPLANT
SUT VIC AB 1 CTX 18 (SUTURE) ×4 IMPLANT
SUT VIC AB 1 CTX 36 (SUTURE) ×1
SUT VIC AB 1 CTX36XBRD ANBCTR (SUTURE) ×3 IMPLANT
SUT VIC AB 2-0 CT1 27 (SUTURE) ×1
SUT VIC AB 2-0 CT1 TAPERPNT 27 (SUTURE) ×3 IMPLANT
SUT VIC AB 2-0 CTX 36 (SUTURE) IMPLANT
SUT VIC AB 3-0 X1 27 (SUTURE) ×4 IMPLANT
SUT VICRYL 0 UR6 27IN ABS (SUTURE) IMPLANT
SUT VICRYL 2 TP 1 (SUTURE) ×4 IMPLANT
SYR 10ML LL (SYRINGE) ×4 IMPLANT
SYR 20ML ECCENTRIC (SYRINGE) ×8 IMPLANT
SYR 20ML LL LF (SYRINGE) ×4 IMPLANT
SYR 3ML LL SCALE MARK (SYRINGE) ×4 IMPLANT
SYR 50ML LL SCALE MARK (SYRINGE) ×4 IMPLANT
SYR 5ML LL (SYRINGE) ×4 IMPLANT
SYR BULB IRRIG 60ML STRL (SYRINGE) ×4 IMPLANT
SYSTEM SAHARA CHEST DRAIN ATS (WOUND CARE) IMPLANT
TAPE CLOTH 4X10 WHT NS (GAUZE/BANDAGES/DRESSINGS) ×4 IMPLANT
TAPE CLOTH SURG 4X10 WHT LF (GAUZE/BANDAGES/DRESSINGS) ×4 IMPLANT
TAPE UMBILICAL COTTON 1/8X30 (MISCELLANEOUS) IMPLANT
TIP APPLICATOR SPRAY EXTEND 16 (VASCULAR PRODUCTS) IMPLANT
TOWEL GREEN STERILE (TOWEL DISPOSABLE) ×4 IMPLANT
TOWEL GREEN STERILE FF (TOWEL DISPOSABLE) ×8 IMPLANT
TOWEL NATURAL 4PK STERILE (DISPOSABLE) IMPLANT
TRAP FLUID SMOKE EVACUATOR (MISCELLANEOUS) ×4 IMPLANT
TRAP SPECIMEN MUCUS 40CC (MISCELLANEOUS) ×4 IMPLANT
TRAY FOLEY MTR SLVR 16FR STAT (SET/KITS/TRAYS/PACK) ×4 IMPLANT
TROCAR FLEXIPATH 20X80 (ENDOMECHANICALS) IMPLANT
TROCAR FLEXIPATH THORACIC 15MM (ENDOMECHANICALS) IMPLANT
TROCAR XCEL 12X100 BLDLESS (ENDOMECHANICALS) IMPLANT
TROCAR XCEL BLADELESS 5X75MML (TROCAR) ×4 IMPLANT
TUBE CONNECTING 12X1/4 (SUCTIONS) ×4 IMPLANT
TUBE CONNECTING 20X1/4 (TUBING) ×16 IMPLANT
TUBING EXTENTION W/L.L. (IV SETS) ×4 IMPLANT
TUBING LAP HI FLOW INSUFFLATIO (TUBING) IMPLANT
VALVE BIOPSY  SINGLE USE (MISCELLANEOUS) ×2
VALVE BIOPSY SINGLE USE (MISCELLANEOUS) ×6 IMPLANT
VALVE IN CARTRIDGE 9MM HUD (Valve) ×4 IMPLANT
VALVE SUCTION BRONCHIO DISP (MISCELLANEOUS) ×8 IMPLANT
WATER STERILE IRR 1000ML POUR (IV SOLUTION) ×8 IMPLANT

## 2020-04-21 NOTE — Progress Notes (Signed)
PROGRESS NOTE  Alyssa Vargas RCV:[id number] DOB: [birth year] DOA: 04/01/2020 PCP: Patient, No Pcp Per  Brief History   Patient is a 35 y.o. female with PMHx of IVDA (heroin/methamphetamine)-who tested positive for COVID-19 on 12/11 (home test)-presented to [facility] on 12/16 with dyspnea, cough-found to have a large left-sided pleural effusion-underwent diagnostic thoracocentesis on 12/17-which was consistent with empyema-subsequently seen by CT surgery-and underwent VATS/decortication on 12/20.    The patient's chest tube is still in place. She continues to have significant pain from the chest tube. Narcotic pain control is limited due to the patient's hypotension. Heart rate has also been high. It is sinus tachycardia. Likely at least in part due to pain.  ID has transitioned the patient from IV ceftriaxine and vancomycin to PO Cefdinir for the 4 weeks remaining in her 6 week course of antibiotics.  Repeat CT chest was performed on 04/20/2019. It has demonstrated a large left pneumothorax with small to moderate amount of loculated left pleural fluid. There was improved aeration in the left lung with residual collapse/consolidation in the left upper and left lower lobes. There is a trace of loculated pleural fluid in the minor fissure.  CTS plans to take the patient back to the OR tomorrow for bronchoscopy, possible VATS/thoracotomy tomorrow.  Consultants  . PCCM . CTS . ID  Procedures  . Thorocentesis . VATS/Decortication 04/05/21 . VATS/Decortication/Empyema drainage 04/21/2020  Antibiotics   Anti-infectives (From admission, onward)   Start     Dose/Rate Route Frequency Ordered Stop   04/16/20 1000  cefdinir (OMNICEF) capsule 600 mg        600 mg Oral Daily 04/14/20 1011 05/14/20 0959   04/06/20 1045  cefTRIAXone (ROCEPHIN) 2 g in sodium chloride 0.9 % 100 mL IVPB        2 g 200 mL/hr over 30 Minutes Intravenous Every 24 hours 04/06/20 0945 04/15/20 1726   04/05/20 1100  cefTRIAXone  (ROCEPHIN) 2 g in sodium chloride 0.9 % 100 mL IVPB  Status:  Discontinued        2 g 200 mL/hr over 30 Minutes Intravenous Every 24 hours 04/05/20 1023 04/05/20 1453   04/03/20 1600  vancomycin (VANCOREADY) IVPB 750 mg/150 mL  Status:  Discontinued        750 mg 150 mL/hr over 60 Minutes Intravenous Every 12 hours 04/03/20 1500 04/05/20 1020   04/03/20 1000  remdesivir 100 mg in sodium chloride 0.9 % 100 mL IVPB  Status:  Discontinued       "Followed by" Linked Group Details   100 mg 200 mL/hr over 30 Minutes Intravenous Daily 04/02/20 0654 04/02/20 0656   04/03/20 1000  remdesivir 100 mg in sodium chloride 0.9 % 100 mL IVPB       "Followed by" Linked Group Details   100 mg 200 mL/hr over 30 Minutes Intravenous Daily 04/02/20 0658 04/06/20 0948   04/03/20 0400  vancomycin (VANCOREADY) IVPB 500 mg/100 mL  Status:  Discontinued        500 mg 100 mL/hr over 60 Minutes Intravenous Every 12 hours 04/02/20 1459 04/03/20 1500   04/02/20 2200  piperacillin-tazobactam (ZOSYN) IVPB 3.375 g  Status:  Discontinued        3.375 g 12.5 mL/hr over 240 Minutes Intravenous Every 8 hours 04/02/20 1332 04/05/20 1023   04/02/20 1515  vancomycin (VANCOREADY) IVPB 1250 mg/250 mL  Status:  Discontinued        1,250 mg 166.7 mL/hr over 90 Minutes Intravenous Every 24 hours 04/02/20 1449 04/02/20  1449   04/02/20 1515  vancomycin (VANCOREADY) IVPB 1250 mg/250 mL        1,250 mg 166.7 mL/hr over 90 Minutes Intravenous  Once 04/02/20 1449 04/02/20 1701   04/02/20 1345  piperacillin-tazobactam (ZOSYN) IVPB 3.375 g        3.375 g 100 mL/hr over 30 Minutes Intravenous  Once 04/02/20 1332 04/02/20 1434   04/02/20 0900  remdesivir 100 mg in sodium chloride 0.9 % 100 mL IVPB       "Followed by" Linked Group Details   100 mg 200 mL/hr over 30 Minutes Intravenous  Once 04/02/20 0658 04/02/20 1251   04/02/20 0800  remdesivir 100 mg in sodium chloride 0.9 % 100 mL IVPB       "Followed by" Linked Group Details   100  mg 200 mL/hr over 30 Minutes Intravenous  Once 04/02/20 0658 04/02/20 0853   04/02/20 0700  remdesivir 200 mg in sodium chloride 0.9% 250 mL IVPB  Status:  Discontinued       "Followed by" Linked Group Details   200 mg 580 mL/hr over 30 Minutes Intravenous Once 04/02/20 0654 04/02/20 0656      Subjective  The patient is seen in PACU following procedure. She is complaining bitterly of pain.   Objective   Vitals:  Vitals:   04/21/20 1425 04/21/20 1440  BP: 96/68 94/61  Pulse: 90 88  Resp: 11 11  Temp: (!) 97.2 F (36.2 C)   SpO2: 100% 100%   Exam:  Constitutional:  The patient is awake, alert, and oriented x 3. Moderate pain from surgery. Respiratory:  . No increased work of breathing. . No wheezes, rales, or rhonchi . No tactile fremitus . Chest tube in place. Cardiovascular:  . Regular rate and rhythm . No murmurs, ectopy, or gallups. . No lateral PMI. No thrills. Abdomen:  . Abdomen is soft, non-tender, non-distended . No hernias, masses, or organomegaly . Normoactive bowel sounds.  Musculoskeletal:  . No cyanosis, clubbing, or edema Skin:  . No rashes, lesions, ulcers . palpation of skin: no induration or nodules Neurologic:  . CN 2-12 intact . Sensation all 4 extremities intact Psychiatric:  . Mental status o Mood, affect appropriate o Orientation to person, place, time  . judgment and insight appear intact  I have personally reviewed the following:   Today's Data  . Orthoptist  . MRSA by PCR is negative. . Blood culture x 2: No growth . Body fluid culture: streptococcus pneumoniae . COVID-19 positive on 03/27/2020 and 04/01/2020. No isolation necessary  Imaging  . CTA chest . CXR . Repeat CT chest 04/19/2020  Cardiology Data  . Echocardiogram  Scheduled Meds: . acidophilus  2 capsule Oral Daily  . bisacodyl  5 mg Oral Daily  . busPIRone  10 mg Oral TID  . cefdinir  600 mg Oral Daily  . Chlorhexidine Gluconate Cloth  6 each  Topical Daily  . dextromethorphan-guaiFENesin  1 tablet Oral BID  . enoxaparin (LOVENOX) injection  40 mg Subcutaneous Q24H  . feeding supplement  1 Container Oral BID BM  . feeding supplement  237 mL Oral TID BM  . fentaNYL (SUBLIMAZE) injection  12.5 mcg Intravenous Once  . lidocaine  1 patch Transdermal Q24H  . metoprolol tartrate  12.5 mg Oral BID  . multivitamin with minerals  1 tablet Oral Daily  . nicotine  14 mg Transdermal Daily  . pantoprazole  40 mg Oral Daily  . senna-docusate  2 tablet  Oral BID  . traZODone  100 mg Oral QHS   Continuous Infusions: . sodium chloride    . 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  Pleural fluid culture positive for Streptococcus pneumoniae-thankfully blood cultures negative.  Chest tube in place-CT surgery following.  Recommendations from [facility] are for 2 weeks of IV Rocephin from 12/20-followed by 4 weeks of oral antimicrobial therapy (Cefdinir).  The patient may require another thoracic procedure. The patient is now saturating 99% on room air. ID has transitioned the patient from IV ceftriaxine and vancomycin to PO Cefdinir for the 4 weeks remaining in her 6 week course of antibiotics. Follow up CT chest today has demonstrated a large left pneumothorax with small to moderate amount of loculated left pleural fluid. There was improved aeration in the left lung with residual collapse/consolidation in  the left upper and left lower lobes. There is a trace of loculated pleural fluid in the minor fissure. TCTS has returned the patient to thr OR and repeated VATS with thoracotomy, decortication and drainage of empyema. Patient appears to have tolerated the procedure well. CT in place.  Pneumothorax: Left Lung - large. Management as per CTS. CT in place.  COVID-19 infection: The patient initially tested positive at home on 03/27/2020 after having symptoms x 1 day. Has been treated with steroid/Remdesivir. Hypoxia was mostly from empyema-rather than COVID-19 related parenchymal infection. As the patient is clinically  Improved. The patient has been taken out of isolation and moved out of 5W.  Tricuspid valve regurgitation: The patient has received 2 weeks of IV ceftriaxone and vancomycin. ID has now converted her to PO cefdinir to complete the next 4 weeks of therapy.  Pericardial effusion: Seen on echo on 12/18, but resolved on echo on 12/28.    Depression/anorexia: Continue BuSpar as at home.  History of IVDA-heroin/methamphetamine: Noted.  Tobacco abuse: Continue transdermal nicotine.  Severe Protein Calorie Malnutrition: Etiology: acute illness (COVID 19 infection)/IVDA. Signs/Symptoms: moderate fat depletion,severe muscle depletion,energy intake < or equal to 50% for > or equal to 5 days. Interventions: Ensure Enlive (each supplement provides 350kcal and 20 grams of protein),Magic cup.  I have seen and examined this patient myself. I have spent 34 minutes in her evaluation and care.  DVT prophylaxis: Lovenox CODE STATUS: Full Code Family Communication: None available Disposition: Status is: Inpatient  Remains inpatient appropriate because:IV treatments appropriate due to intensity of illness or inability to take PO  Dispo: The patient is from: Home              Anticipated d/c is to: Home              Anticipated d/c date is: > 3 days              Patient currently is not  medically stable to d/c.   Fran Lowes, DO [facility] 04/20/2020 3:12 PM

## 2020-04-21 NOTE — Plan of Care (Signed)

## 2020-04-21 NOTE — Brief Op Note (Addendum)
04/01/2020 - 04/21/2020  11:33 AM  PATIENT:  Alyssa Vargas  35 y.o. female  PRE-OPERATIVE DIAGNOSIS:  Persistent air leak, persistent poor inflation of lung  POST-OPERATIVE DIAGNOSIS: sAME   PROCEDURE:  VIDEO BRONCHOSCOPY WITH BRONCHIAL WASHINGS (Bilateral), LEFT VIDEO ASSISTED THORACOSCOPY (VATS), LEFT MINI THORACOTOMY, DECORTICATION, RESECTION OF PORTION OF NECROTIC LEFT LUNG,  INSERTION OF INTERBRONCHIAL VALVE (IBV) x 1 in ANTERIOR SEGMENT of the LLL   SURGEON:  Surgeon(s) and Role:    Delight Ovens, MD - Primary  PHYSICIAN ASSISTANT: Doree Fudge PA-C  ANESTHESIA:   general  EBL:  50 mL   DRAINS: CHEST TUBE INSERTION (28 BLAKE AND 28 STRAIGHT FRENCH CHEST TUBES) TO LEFT PLEURAL SPACE   LOCAL MEDICATIONS USED:  EXPAREL  SPECIMEN:  Source of Specimen:  Portion of necrotic left lung, debris left lung, lymph node  DISPOSITION OF SPECIMEN:  PATHOLOGY  COUNTS CORRECT:  YES  DICTATION: .Dragon Dictation  PLAN OF CARE: Admit to inpatient   PATIENT DISPOSITION:  PACU - hemodynamically stable.   Delay start of Pharmacological VTE agent (>24hrs) due to surgical blood loss or risk of bleeding: yes

## 2020-04-21 NOTE — Anesthesia Procedure Notes (Signed)
Central Venous Catheter Insertion Performed by: Eilene Ghazi, MD, anesthesiologist Start/End1/08/2020 8:00 AM, 04/21/2020 8:15 AM Patient location: Pre-op. Preanesthetic checklist: patient identified, IV checked, site marked, risks and benefits discussed, surgical consent, monitors and equipment checked, pre-op evaluation, timeout performed and anesthesia consent Position: Trendelenburg Lidocaine 1% used for infiltration and patient sedated Hand hygiene performed  and maximum sterile barriers used  Catheter size: 8 Fr Total catheter length 16. Central line was placed.Double lumen Procedure performed using ultrasound guided technique. Ultrasound Notes:anatomy identified, needle tip was noted to be adjacent to the nerve/plexus identified, no ultrasound evidence of intravascular and/or intraneural injection and image(s) printed for medical record Attempts: 1 Following insertion, dressing applied, line sutured and Biopatch. Post procedure assessment: blood return through all ports, free fluid flow and no air  Patient tolerated the procedure well with no immediate complications.

## 2020-04-21 NOTE — Anesthesia Postprocedure Evaluation (Signed)
Anesthesia Post Note  Patient: Alyssa Vargas  Procedure(s) Performed: VIDEO BRONCHOSCOPY WITH BRONCHIAL WASHINGS (Bilateral ) VIDEO ASSISTED THORACOSCOPY (VATS)/ MINI THORACOTOMY, DECORTICATION, DRAINAGE OF EMPYEMA, RESECTION OF NECROTIC PORTION OF LEFT LOWER LOBE (Left Chest) INSERTION OF INTERBRONCHIAL VALVE (IBV) ANTERIOR SEGMENT #19 (Left ) CHEST TUBE INSERTION x 2 USING 28 BARD AND 28 STRAIGHT TO LEFT PLEURA (Left Chest) INTERCOSTAL NERVE BLOCK (Left )     Patient location during evaluation: PACU Anesthesia Type: General Level of consciousness: awake and alert Pain management: pain level controlled Vital Signs Assessment: post-procedure vital signs reviewed and stable Respiratory status: spontaneous breathing, nonlabored ventilation, respiratory function stable and patient connected to nasal cannula oxygen Cardiovascular status: blood pressure returned to baseline and stable Postop Assessment: no apparent nausea or vomiting Anesthetic complications: no   No complications documented.  Last Vitals:  Vitals:   04/21/20 0000 04/21/20 0603  BP:  103/68  Pulse: (!) 103 96  Resp:  18  Temp:  37.1 C  SpO2: 100% 99%    Last Pain:  Vitals:   04/21/20 0730  TempSrc:   PainSc: 0-No pain                 Sarrah Fiorenza S

## 2020-04-21 NOTE — Anesthesia Procedure Notes (Signed)
Arterial Line Insertion Start/End1/08/2020 7:55 AM, 04/21/2020 8:00 AM Performed by: Waynard Edwards, CRNA, CRNA  Patient location: Pre-op. Preanesthetic checklist: patient identified, IV checked, site marked, risks and benefits discussed, surgical consent, monitors and equipment checked, pre-op evaluation, timeout performed and anesthesia consent Lidocaine 1% used for infiltration Left, radial was placed Catheter size: 20 G Hand hygiene performed  and maximum sterile barriers used  Allen's test indicative of satisfactory collateral circulation Attempts: 1 Procedure performed without using ultrasound guided technique. Following insertion, dressing applied and Biopatch. Post procedure assessment: normal and unchanged  Patient tolerated the procedure well with no immediate complications.

## 2020-04-21 NOTE — Transfer of Care (Signed)
Immediate Anesthesia Transfer of Care Note  Patient: Alyssa Vargas  Procedure(s) Performed: VIDEO BRONCHOSCOPY WITH BRONCHIAL WASHINGS (Bilateral ) VIDEO ASSISTED THORACOSCOPY (VATS)/ MINI THORACOTOMY, DECORTICATION, DRAINAGE OF EMPYEMA, RESECTION OF NECROTIC PORTION OF LEFT LOWER LOBE (Left Chest) INSERTION OF INTERBRONCHIAL VALVE (IBV) ANTERIOR SEGMENT #19 (Left ) CHEST TUBE INSERTION x 2 USING 28 BARD AND 28 STRAIGHT TO LEFT PLEURA (Left Chest) INTERCOSTAL NERVE BLOCK (Left )  Patient Location: PACU  Anesthesia Type:General  Level of Consciousness: awake, alert , oriented and patient cooperative  Airway & Oxygen Therapy: Patient Spontanous Breathing and Patient connected to face mask oxygen  Post-op Assessment: Report given to RN, Post -op Vital signs reviewed and stable and Patient moving all extremities X 4  Post vital signs: Reviewed and stable  Last Vitals:  Vitals Value Taken Time  BP 105/74 04/21/20 1239  Temp    Pulse 104 04/21/20 1245  Resp 17 04/21/20 1245  SpO2 96 % 04/21/20 1245  Vitals shown include unvalidated device data.  Last Pain:  Vitals:   04/21/20 0730  TempSrc:   PainSc: 0-No pain      Patients Stated Pain Goal: 0 (04/20/20 2000)  Complications: No complications documented.

## 2020-04-21 NOTE — Anesthesia Procedure Notes (Signed)
Procedure Name: Intubation Date/Time: 04/21/2020 9:01 AM Performed by: Waynard Edwards, CRNA Pre-anesthesia Checklist: Patient identified, Emergency Drugs available, Suction available and Patient being monitored Patient Re-evaluated:Patient Re-evaluated prior to induction Oxygen Delivery Method: Circle system utilized Preoxygenation: Pre-oxygenation with 100% oxygen Induction Type: IV induction Ventilation: Mask ventilation without difficulty Laryngoscope Size: Miller and 2 Grade View: Grade I Tube type: Oral Tube size: 8.5 mm Number of attempts: 1 Airway Equipment and Method: Stylet and Oral airway Placement Confirmation: ETT inserted through vocal cords under direct vision,  positive ETCO2 and breath sounds checked- equal and bilateral Secured at: 22 cm Tube secured with: Tape Dental Injury: Teeth and Oropharynx as per pre-operative assessment

## 2020-04-21 NOTE — Anesthesia Procedure Notes (Signed)
Procedure Name: Intubation Date/Time: 04/21/2020 11:45 AM Performed by: Waynard Edwards, CRNA Pre-anesthesia Checklist: Patient identified, Emergency Drugs available, Suction available and Patient being monitored Patient Re-evaluated:Patient Re-evaluated prior to induction Oxygen Delivery Method: Circle system utilized Preoxygenation: Pre-oxygenation with 100% oxygen Induction Type: Inhalational induction with existing ETT Laryngoscope Size: Miller and 3 Grade View: Grade I Tube type: Oral Tube size: 8.5 mm Number of attempts: 1 Airway Equipment and Method: Stylet and Oral airway Placement Confirmation: ETT inserted through vocal cords under direct vision,  positive ETCO2 and breath sounds checked- equal and bilateral Secured at: 23 cm Tube secured with: Tape Dental Injury: Teeth and Oropharynx as per pre-operative assessment

## 2020-04-21 NOTE — Anesthesia Procedure Notes (Signed)
Procedure Name: Intubation Date/Time: 04/21/2020 9:14 AM Performed by: Waynard Edwards, CRNA Pre-anesthesia Checklist: Patient identified, Emergency Drugs available, Suction available and Patient being monitored Patient Re-evaluated:Patient Re-evaluated prior to induction Oxygen Delivery Method: Circle system utilized Preoxygenation: Pre-oxygenation with 100% oxygen Induction Type: IV induction Ventilation: Mask ventilation without difficulty Laryngoscope Size: Miller and 2 Grade View: Grade I Tube type: Oral Endobronchial tube: Left, Double lumen EBT, EBT position confirmed by auscultation and EBT position confirmed by fiberoptic bronchoscope and 37 Fr Number of attempts: 1 Airway Equipment and Method: Stylet and Oral airway Placement Confirmation: ETT inserted through vocal cords under direct vision,  positive ETCO2 and breath sounds checked- equal and bilateral Secured at: 29 cm Tube secured with: Tape Dental Injury: Teeth and Oropharynx as per pre-operative assessment

## 2020-04-21 NOTE — Progress Notes (Signed)
Pre Procedure note for inpatients:   Alyssa Vargas has been scheduled for Procedure(s): VIDEO ASSISTED THORACOSCOPY (VATS)/EMPYEMA DRAINAGE, DECORTICATION  (Left) today. The various methods of treatment have been discussed with the patient. After consideration of the risks, benefits and treatment options the patient has consented to the planned procedure.   The patient has been seen and labs reviewed. There are no changes in the patient's condition to prevent proceeding with the planned procedure today.  Recent labs:  Lab Results  Component Value Date   WBC 5.2 04/20/2020   HGB 9.6 (L) 04/20/2020   HCT 29.5 (L) 04/20/2020   PLT 416 (H) 04/20/2020   GLUCOSE 135 (H) 04/20/2020   ALT 16 04/13/2020   AST 17 04/13/2020   NA 138 04/20/2020   K 3.9 04/20/2020   CL 100 04/20/2020   CREATININE 0.72 04/20/2020   BUN 13 04/20/2020   CO2 27 04/20/2020   INR 1.1 04/03/2020    Delight Ovens, MD 04/21/2020 7:18 AM

## 2020-04-21 NOTE — Anesthesia Preprocedure Evaluation (Signed)
Anesthesia Evaluation  Patient identified by MRN, date of birth, ID band Patient awake    Reviewed: Allergy & Precautions, H&P , NPO status , Patient's Chart, lab work & pertinent test results  Airway Mallampati: II  TM Distance: >3 FB Neck ROM: Full    Dental no notable dental hx.    Pulmonary Current Smoker,    breath sounds clear to auscultation + decreased breath sounds      Cardiovascular negative cardio ROS Normal cardiovascular exam Rhythm:Regular Rate:Normal     Neuro/Psych negative neurological ROS  negative psych ROS   GI/Hepatic negative GI ROS, (+)     substance abuse  methamphetamine use and IV drug use,   Endo/Other  negative endocrine ROS  Renal/GU negative Renal ROS  negative genitourinary   Musculoskeletal negative musculoskeletal ROS (+)   Abdominal   Peds negative pediatric ROS (+)  Hematology  (+) anemia ,   Anesthesia Other Findings   Reproductive/Obstetrics negative OB ROS                             Anesthesia Physical Anesthesia Plan  ASA: III  Anesthesia Plan: General   Post-op Pain Management:    Induction: Intravenous  PONV Risk Score and Plan: 2 and Ondansetron, Dexamethasone and Treatment may vary due to age or medical condition  Airway Management Planned: Double Lumen EBT  Additional Equipment: Arterial line  Intra-op Plan:   Post-operative Plan: Extubation in OR  Informed Consent: I have reviewed the patients History and Physical, chart, labs and discussed the procedure including the risks, benefits and alternatives for the proposed anesthesia with the patient or authorized representative who has indicated his/her understanding and acceptance.     Dental advisory given  Plan Discussed with: CRNA and Surgeon  Anesthesia Plan Comments:         Anesthesia Quick Evaluation

## 2020-04-22 ENCOUNTER — Inpatient Hospital Stay (HOSPITAL_COMMUNITY): Payer: Medicaid Other

## 2020-04-22 ENCOUNTER — Encounter (HOSPITAL_COMMUNITY): Payer: Self-pay | Admitting: Cardiothoracic Surgery

## 2020-04-22 ENCOUNTER — Encounter: Payer: Medicaid - Out of State | Admitting: Cardiothoracic Surgery

## 2020-04-22 DIAGNOSIS — J869 Pyothorax without fistula: Secondary | ICD-10-CM | POA: Diagnosis not present

## 2020-04-22 LAB — BASIC METABOLIC PANEL
Anion gap: 7 (ref 5–15)
BUN: 12 mg/dL (ref 6–20)
CO2: 28 mmol/L (ref 22–32)
Calcium: 8.1 mg/dL — ABNORMAL LOW (ref 8.9–10.3)
Chloride: 104 mmol/L (ref 98–111)
Creatinine, Ser: 0.57 mg/dL (ref 0.44–1.00)
GFR, Estimated: >60 mL/min (ref >60)
Glucose, Bld: 103 mg/dL — ABNORMAL HIGH (ref 70–99)
Potassium: 4.3 mmol/L (ref 3.5–5.1)
Sodium: 139 mmol/L (ref 135–145)

## 2020-04-22 LAB — CBC
HCT: 24.5 % — ABNORMAL LOW (ref 36.0–46.0)
Hemoglobin: 8.1 g/dL — ABNORMAL LOW (ref 12.0–15.0)
MCH: 28.9 pg (ref 26.0–34.0)
MCHC: 33.1 g/dL (ref 30.0–36.0)
MCV: 87.5 fL (ref 80.0–100.0)
Platelets: 388 10*3/uL (ref 150–400)
RBC: 2.8 MIL/uL — ABNORMAL LOW (ref 3.87–5.11)
RDW: 15.3 % (ref 11.5–15.5)
WBC: 6.6 10*3/uL (ref 4.0–10.5)
nRBC: 0 % (ref 0.0–0.2)

## 2020-04-22 MED ORDER — KETOROLAC TROMETHAMINE 15 MG/ML IJ SOLN
15.0000 mg | Freq: Four times a day (QID) | INTRAMUSCULAR | Status: AC | PRN
  Administered 2020-04-22 – 2020-04-25 (×6): 15 mg via INTRAVENOUS
  Filled 2020-04-22 (×6): qty 1

## 2020-04-22 NOTE — Progress Notes (Addendum)
      [address]       [place] [address]             [phone]       1 Day Post-Op Procedure(s) (LRB): VIDEO BRONCHOSCOPY WITH BRONCHIAL WASHINGS (Bilateral) VIDEO ASSISTED THORACOSCOPY (VATS)/ MINI THORACOTOMY, DECORTICATION, DRAINAGE OF EMPYEMA, RESECTION OF NECROTIC PORTION OF LEFT LOWER LOBE (Left) INSERTION OF INTERBRONCHIAL VALVE (IBV) ANTERIOR SEGMENT #19 (Left) CHEST TUBE INSERTION x 2 USING 28 BARD AND 28 STRAIGHT TO LEFT PLEURA (Left) INTERCOSTAL NERVE BLOCK (Left)  Subjective: Patient had a very bad night. She is in a lot of pain. She denies nausea  Objective: Vital signs in last 24 hours: Temp:  [97.2 F (36.2 C)-98.7 F (37.1 C)] 98.7 F (37.1 C) (01/06 0400) Pulse Rate:  [85-112] 94 (01/06 0400) Cardiac Rhythm: Sinus tachycardia (01/06 0400) Resp:  [7-20] 17 (01/06 0400) BP: (91-109)/(61-79) 103/75 (01/06 0400) SpO2:  [95 %-100 %] 95 % (01/06 0400) Arterial Line BP: (87-111)/(49-60) 102/60 (01/05 1640)      Intake/Output from previous day: 01/05 0701 - 01/06 0700 In: 2311.9 [P.O.:120; I.V.:2141.9; IV Piggyback:50] Out: 1665 [Urine:1475; Blood:100; Chest Tube:90]   Physical Exam:  Cardiovascular: Tachycardic this am Pulmonary: Clear to auscultation on the right and diminished left lateral breath wounds Abdomen: Soft, non tender, bowel sounds present. Extremities: Mild bilateral lower extremity edema. Wounds: Clean and dry.  No erythema or signs of infection. Chest Tubes: to suction, tidiling with cough but no air leak  Lab Results: BPZ:WCHENI Labs    04/20/20 0359 04/22/20 0625  WBC 5.2 6.6  HGB 9.6* 8.1*  HCT 29.5* 24.5*  PLT 416* 388   BMET:  Recent Labs    04/20/20 0359 04/22/20 0625  NA 138 139  K 3.9 4.3  CL 100 104  CO2 27 28  GLUCOSE 135* 103*  BUN 13 12  CREATININE 0.72 0.57  CALCIUM 8.5* 8.1*    PT/INR: No results for input(s): LABPROT, INR in the last 72 hours. ABG:  INR: Will add last result  for INR, ABG once components are confirmed Will add last 4 CBG results once components are confirmed  Assessment/Plan:  1. CV - SR, ST at times. On Lopressor 12.5 mg bid. 2.  Pulmonary - Chest tube with 90 cc since surgery. Chest tubes are to suction. There is no air leak. CXR this am appears stable (space left lateral chest). Encourage incentive spirometer. 3. Anemia-H and H this am decreased to 8.1 and 24.5. Monitor 4. ID-Transitioned  to oral Cefdinir for 4 weeks for a total of 6 weeks of antibiotic therapy. Gram stain from pleural debris showed no organisms. Await culture. 5. Decrease IVF and remove foley 6. Regarding pain control, will defer to primary service. She is on Tylenol, Oxy PRN, Dilaudid Q 4 PRN, and Xanax PRN. If ok with primary service, could consider low dose scheduled Toradol for a few doses  Donielle M ZimmermanPA-C 04/22/2020,7:07 AM [phone] I have seen and examined Bennie Pierini and agree with the above assessment  and plan.  Delight Ovens MD Beeper [phone] Office [phone] 04/23/2020 10:11 AM

## 2020-04-22 NOTE — Progress Notes (Signed)
Nutrition Follow-up  RD working remotely.  DOCUMENTATION CODES:   Severe malnutrition in context of acute illness/injury,Underweight  INTERVENTION:   - Recommend obtaining updated weight, last available weight is from 04/02/20  - Continue Ensure Enlive po TID, each supplement provides 350 kcal and 20 grams of protein  - Continue Boost Breeze po BID, each supplement provides 250 kcal and 9 grams of protein.  - Continue Magic cup TID with meals, each supplement provides 290 kcal and 9 grams of protein  - Encourage adequate PO intake  NUTRITION DIAGNOSIS:   Severe Malnutrition related to acute illness (COVID 19 infection) as evidenced by moderate fat depletion,severe muscle depletion,energy intake < or equal to 50% for > or equal to 5 days.  Ongoing  GOAL:   Patient will meet greater than or equal to 90% of their needs  Progressing  MONITOR:   Supplement acceptance,Weight trends,PO intake,Labs,I & O's  REASON FOR ASSESSMENT:   Malnutrition Screening Tool    ASSESSMENT:   35 y.o. female with PMHx of IVDA (heroin/methamphetamine)-positive for COVID-19 on 12/11-presents with dyspnea, cough-found to have a large left-sided pleural effusion-underwent diagnostic thoracocentesis on 12/17-which was consistent with empyema-subsequently seen by CT surgery-and underwent VATS/decortication on 12/20.  1/05 - s/p VATS, left mini thoracotomy, decortication, resection of portion of necrotic left lung, insertion of interbronchial valve  Attempted to speak with pt via phone call to room; however, no answer. Per RN, pt consumed 2 pancakes (~280 kcal, 5 grams of protein) and a bowl of cereal from home this morning for breakfast. Per MAR documentation, pt accepting 100% of Ensure Enlive supplements that were offered since 1/03. Will continue with current supplement regimen.  Recommend obtaining updated weight as last available weight is from 04/02/20.  Meal Completion: 75-100% x last 8  documented meals (most recent meal documentation not available)  Medications reviewed and include: Boost Breeze BID, Ensure Enlive TID, protonix, senna IVF: NS with KCl @ 10 ml/hr  Labs reviewed: hemoglobin 8.1  UOP: 1475 ml x 24 hours CT: 90 ml x 24 hours I/O's: +8.8 L since admit  Diet Order:   Diet Order            Diet regular Room service appropriate? Yes; Fluid consistency: Thin  Diet effective now                 EDUCATION NEEDS:   Education needs have been addressed  Skin:  Skin Assessment: Skin Integrity Issues: Incisions: left chest x 2  Last BM:  04/19/20  Height:   Ht Readings from Last 1 Encounters:  04/02/20 5\' 7"  (1.702 m)    Weight:   Wt Readings from Last 1 Encounters:  04/02/20 50.2 kg    Ideal Body Weight:  61.4 kg  BMI:  Body mass index is 17.33 kg/m.  Estimated Nutritional Needs:   Kcal:  2000-2200  Protein:  110-125 grams  Fluid:  > 2 L    04/04/20, MS, RD, LDN Inpatient Clinical Dietitian Please see AMiON for contact information.

## 2020-04-22 NOTE — Progress Notes (Signed)
PROGRESS NOTE  Alyssa Vargas KNL:[id number] DOB: 1986/02/05 DOA: 04/01/2020 PCP: Patient, No Pcp Per  Brief History   Patient is a 35 y.o. female with PMHx of IVDA (heroin/methamphetamine)-who tested positive for COVID-19 on 12/11 (home test)-presented to [facility] on [facility]16 with dyspnea, cough-found to have a large left-sided pleural effusion-underwent diagnostic thoracocentesis on 12/17-which was consistent with empyema-subsequently seen by CT surgery-and underwent VATS/decortication on 12/20.    The patient's chest tube is still in place. She continues to have significant pain from the chest tube. Narcotic pain control is limited due to the patient's hypotension. Heart rate has also been high. It is sinus tachycardia. Likely at least in part due to pain.  ID has transitioned the patient from IV ceftriaxine and vancomycin to PO Cefdinir for the 4 weeks remaining in her 6 week course of antibiotics.  Repeat CT chest was performed on 04/20/2019. It has demonstrated a large left pneumothorax with small to moderate amount of loculated left pleural fluid. There was improved aeration in the left lung with residual collapse/consolidation in the left upper and left lower lobes. There is a trace of loculated pleural fluid in the minor fissure.  The patient was taken back to the OR on 04/21/2020 for repeat VATS, bronchoscopy and thoracotomy with drainage of empyema. The patient continues to have severe pain in her chest with respiration or movement.  Consultants  . PCCM . CTS . ID  Procedures  . Thorocentesis . VATS/Decortication 04/05/21 . VATS/Decortication/Empyema drainage 04/21/2020  Antibiotics   Anti-infectives (From admission, onward)   Start     Dose/Rate Route Frequency Ordered Stop   04/22/20 1000  cefdinir (OMNICEF) capsule 600 mg        600 mg Oral Daily 04/21/20 1533 05/14/20 0959   04/21/20 1700  ceFAZolin (ANCEF) IVPB 2g/100 mL premix        2 g 200 mL/hr over 30 Minutes Intravenous Every  8 hours 04/21/20 1533 04/21/20 1729   04/16/20 1000  cefdinir (OMNICEF) capsule 600 mg  Status:  Discontinued        600 mg Oral Daily 04/14/20 1011 04/21/20 1533   04/06/20 1045  cefTRIAXone (ROCEPHIN) 2 g in sodium chloride 0.9 % 100 mL IVPB        2 g 200 mL/hr over 30 Minutes Intravenous Every 24 hours 04/06/20 0945 04/15/20 1726   04/05/20 1100  cefTRIAXone (ROCEPHIN) 2 g in sodium chloride 0.9 % 100 mL IVPB  Status:  Discontinued        2 g 200 mL/hr over 30 Minutes Intravenous Every 24 hours 04/05/20 1023 04/05/20 1453   04/03/20 1600  vancomycin (VANCOREADY) IVPB 750 mg/150 mL  Status:  Discontinued        750 mg 150 mL/hr over 60 Minutes Intravenous Every 12 hours 04/03/20 1500 04/05/20 1020   04/03/20 1000  remdesivir 100 mg in sodium chloride 0.9 % 100 mL IVPB  Status:  Discontinued       "Followed by" Linked Group Details   100 mg 200 mL/hr over 30 Minutes Intravenous Daily 04/02/20 0654 04/02/20 0656   04/03/20 1000  remdesivir 100 mg in sodium chloride 0.9 % 100 mL IVPB       "Followed by" Linked Group Details   100 mg 200 mL/hr over 30 Minutes Intravenous Daily 04/02/20 0658 04/06/20 0948   04/03/20 0400  vancomycin (VANCOREADY) IVPB 500 mg/100 mL  Status:  Discontinued        500 mg 100 mL/hr over 60 Minutes  Intravenous Every 12 hours 04/02/20 1459 04/03/20 1500   04/02/20 2200  piperacillin-tazobactam (ZOSYN) IVPB 3.375 g  Status:  Discontinued        3.375 g 12.5 mL/hr over 240 Minutes Intravenous Every 8 hours 04/02/20 1332 04/05/20 1023   04/02/20 1515  vancomycin (VANCOREADY) IVPB 1250 mg/250 mL  Status:  Discontinued        1,250 mg 166.7 mL/hr over 90 Minutes Intravenous Every 24 hours 04/02/20 1449 04/02/20 1449   04/02/20 1515  vancomycin (VANCOREADY) IVPB 1250 mg/250 mL        1,250 mg 166.7 mL/hr over 90 Minutes Intravenous  Once 04/02/20 1449 04/02/20 1701   04/02/20 1345  piperacillin-tazobactam (ZOSYN) IVPB 3.375 g        3.375 g 100 mL/hr over 30  Minutes Intravenous  Once 04/02/20 1332 04/02/20 1434   04/02/20 0900  remdesivir 100 mg in sodium chloride 0.9 % 100 mL IVPB       "Followed by" Linked Group Details   100 mg 200 mL/hr over 30 Minutes Intravenous  Once 04/02/20 0658 04/02/20 1251   04/02/20 0800  remdesivir 100 mg in sodium chloride 0.9 % 100 mL IVPB       "Followed by" Linked Group Details   100 mg 200 mL/hr over 30 Minutes Intravenous  Once 04/02/20 0658 04/02/20 0853   04/02/20 0700  remdesivir 200 mg in sodium chloride 0.9% 250 mL IVPB  Status:  Discontinued       "Followed by" Linked Group Details   200 mg 580 mL/hr over 30 Minutes Intravenous Once 04/02/20 0654 04/02/20 0656      Subjective  The patient is resting in bed. She is having severe pain. CTS has been advissed.   Objective   Vitals:  Vitals:   04/22/20 0749 04/22/20 1048  BP: 106/74 103/75  Pulse: (!) 109   Resp: 19 18  Temp: 98.1 F (36.7 C) 98 F (36.7 C)  SpO2: 95%    Exam:  Constitutional:  The patient is awake, alert, and oriented x 3. She continues to have severe pain from surgery. Respiratory:  . No increased work of breathing. . No wheezes, rales, or rhonchi . No tactile fremitus . Chest tube in place. Cardiovascular:  . Regular rate and rhythm . No murmurs, ectopy, or gallups. . No lateral PMI. No thrills. Abdomen:  . Abdomen is soft, non-tender, non-distended . No hernias, masses, or organomegaly . Normoactive bowel sounds.  Musculoskeletal:  . No cyanosis, clubbing, or edema Skin:  . No rashes, lesions, ulcers . palpation of skin: no induration or nodules Neurologic:  . CN 2-12 intact . Sensation all 4 extremities intact Psychiatric:  . Mental status o Mood, affect appropriate o Orientation to person, place, time  . judgment and insight appear intact  I have personally reviewed the following:   Today's Data  . Vitals, CBC, BMP  Micro Data  . MRSA by PCR is negative. . Blood culture x 2: No  growth . Body fluid culture: streptococcus pneumoniae . COVID-19 positive on 03/27/2020 and 04/01/2020. No isolation necessary  Imaging  . CTA chest . CXR . Repeat CT chest 04/19/2020  Cardiology Data  . Echocardiogram  Scheduled Meds: . acetaminophen  1,000 mg Oral Q6H   Or  . acetaminophen (TYLENOL) oral liquid 160 mg/5 mL  1,000 mg Oral Q6H  . acidophilus  2 capsule Oral Daily  . busPIRone  10 mg Oral TID  . cefdinir  600 mg Oral Daily  .  Chlorhexidine Gluconate Cloth  6 each Topical Daily  . dextromethorphan-guaiFENesin  1 tablet Oral BID  . enoxaparin (LOVENOX) injection  40 mg Subcutaneous Q24H  . feeding supplement  1 Container Oral BID BM  . feeding supplement  237 mL Oral TID BM  . fentaNYL (SUBLIMAZE) injection  12.5 mcg Intravenous Once  . lidocaine  1 patch Transdermal Q24H  . metoprolol tartrate  12.5 mg Oral BID  . multivitamin with minerals  1 tablet Oral Daily  . nicotine  14 mg Transdermal Daily  . pantoprazole  40 mg Oral Daily  . senna-docusate  2 tablet Oral BID  . traZODone  100 mg Oral QHS   Continuous Infusions: . sodium chloride    . 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  Pleural fluid culture positive for Streptococcus pneumoniae-thankfully blood cultures negative.  Chest tube in place-CT surgery following.  Recommendations from [facility] are for 2 weeks of IV Rocephin from 12/20-followed  by 4 weeks of oral antimicrobial therapy (Cefdinir).  The patient may require another thoracic procedure. The patient is now saturating 99% on room air. ID has transitioned the patient from IV ceftriaxine and vancomycin to PO Cefdinir for the 4 weeks remaining in her 6 week course of antibiotics. Follow up CT chest today has demonstrated a large left pneumothorax with small to moderate amount of loculated left pleural fluid. There was improved aeration in the left lung with residual collapse/consolidation in the left upper and left lower lobes. There is a trace of loculated pleural fluid in the minor fissure. TCTS has returned the patient to thr OR and repeated VATS with thoracotomy, decortication and drainage of empyema. Patient appears to have tolerated the procedure well. CT in place. However, she is having severe chest pain.  Pneumothorax: Left Lung - large. Management as per CTS. CT in place.  COVID-19 infection: The patient initially tested positive at home on 03/27/2020 after having symptoms x 1 day. Has been treated with steroid/Remdesivir. Hypoxia was mostly from empyema-rather than COVID-19 related parenchymal infection. As the patient is clinically  Improved. The patient has been taken out of isolation and moved out of 5W.  Tricuspid valve regurgitation: The patient has received 2 weeks of IV ceftriaxone and vancomycin. ID has now converted her to PO cefdinir to complete the next 4 weeks of therapy.  Pericardial effusion: Seen on echo on 12/18, but resolved on echo on 12/28.    Depression/anorexia: Continue BuSpar as at home.  History of IVDA-heroin/methamphetamine: Noted.  Tobacco abuse: Continue transdermal nicotine.  Severe Protein Calorie Malnutrition: Etiology: acute illness (COVID 19 infection)/IVDA. Signs/Symptoms: moderate fat depletion,severe muscle depletion,energy intake < or equal to 50% for > or equal to 5 days. Interventions: Ensure Enlive (each supplement provides  350kcal and 20 grams of protein),Magic cup.  I have seen and examined this patient myself. I have spent 38 minutes in her evaluation and care.  DVT prophylaxis: Lovenox CODE STATUS: Full Code Family Communication: None available Disposition: Status is: Inpatient  Remains inpatient appropriate because:IV treatments appropriate due to intensity of illness or inability to take PO  Dispo: The patient is from: Home              Anticipated d/c is to: Home              Anticipated d/c date is: > 3 days              Patient currently is not medically stable to d/c.   Fran Lowes, DO [facility] 04/22/2020 3:12 PM

## 2020-04-23 ENCOUNTER — Inpatient Hospital Stay (HOSPITAL_COMMUNITY): Payer: Medicaid Other

## 2020-04-23 DIAGNOSIS — J869 Pyothorax without fistula: Secondary | ICD-10-CM | POA: Diagnosis not present

## 2020-04-23 LAB — COMPREHENSIVE METABOLIC PANEL
ALT: 18 U/L (ref 0–44)
AST: 21 U/L (ref 15–41)
Albumin: 1.9 g/dL — ABNORMAL LOW (ref 3.5–5.0)
Alkaline Phosphatase: 53 U/L (ref 38–126)
Anion gap: 8 (ref 5–15)
BUN: 13 mg/dL (ref 6–20)
CO2: 27 mmol/L (ref 22–32)
Calcium: 8.3 mg/dL — ABNORMAL LOW (ref 8.9–10.3)
Chloride: 106 mmol/L (ref 98–111)
Creatinine, Ser: 0.55 mg/dL (ref 0.44–1.00)
GFR, Estimated: >60 mL/min (ref >60)
Glucose, Bld: 103 mg/dL — ABNORMAL HIGH (ref 70–99)
Potassium: 4.2 mmol/L (ref 3.5–5.1)
Sodium: 141 mmol/L (ref 135–145)
Total Bilirubin: 0.3 mg/dL (ref 0.3–1.2)
Total Protein: 5.5 g/dL — ABNORMAL LOW (ref 6.5–8.1)

## 2020-04-23 LAB — CULTURE, RESPIRATORY W GRAM STAIN: Culture: NO GROWTH

## 2020-04-23 LAB — CBC
HCT: 28.7 % — ABNORMAL LOW (ref 36.0–46.0)
Hemoglobin: 8.7 g/dL — ABNORMAL LOW (ref 12.0–15.0)
MCH: 27.4 pg (ref 26.0–34.0)
MCHC: 30.3 g/dL (ref 30.0–36.0)
MCV: 90.3 fL (ref 80.0–100.0)
Platelets: 421 10*3/uL — ABNORMAL HIGH (ref 150–400)
RBC: 3.18 MIL/uL — ABNORMAL LOW (ref 3.87–5.11)
RDW: 15.4 % (ref 11.5–15.5)
WBC: 5.1 10*3/uL (ref 4.0–10.5)
nRBC: 0 % (ref 0.0–0.2)

## 2020-04-23 LAB — SURGICAL PATHOLOGY

## 2020-04-23 NOTE — Progress Notes (Signed)
PROGRESS NOTE  Alyssa Vargas OVZ:[id number] DOB: 11-03-1985 DOA: 04/01/2020 PCP: Patient, No Pcp Per  Brief History   Patient is a 35 y.o. female with PMHx of IVDA (heroin/methamphetamine)-who tested positive for COVID-19 on 12/11 (home test)-presented to [facility] on 12/16 with dyspnea, cough-found to have a large left-sided pleural effusion-underwent diagnostic thoracocentesis on 12/17-which was consistent with empyema-subsequently seen by CT surgery-and underwent VATS/decortication on 12/20.    The patient's chest tube is still in place. She continues to have significant pain from the chest tube. Narcotic pain control is limited due to the patient's hypotension. Heart rate has also been high. It is sinus tachycardia. Likely at least in part due to pain.  ID has transitioned the patient from IV ceftriaxine and vancomycin to PO Cefdinir for the 4 weeks remaining in her 6 week course of antibiotics.  Repeat CT chest was performed on 04/20/2019. It has demonstrated a large left pneumothorax with small to moderate amount of loculated left pleural fluid. There was improved aeration in the left lung with residual collapse/consolidation in the left upper and left lower lobes. There is a trace of loculated pleural fluid in the minor fissure.  The patient was taken back to the OR on 04/21/2020 for repeat VATS, bronchoscopy and thoracotomy with drainage of empyema. The patient's pain is under improved control after adjustments were made to her pain regimen by CTS.  Consultants  . PCCM . CTS . ID  Procedures  . Thorocentesis . VATS/Decortication 04/05/21 . VATS/Decortication/Empyema drainage 04/21/2020  Antibiotics   Anti-infectives (From admission, onward)   Start     Dose/Rate Route Frequency Ordered Stop   04/22/20 1000  cefdinir (OMNICEF) capsule 600 mg        600 mg Oral Daily 04/21/20 1533 05/14/20 0959   04/21/20 1700  ceFAZolin (ANCEF) IVPB 2g/100 mL premix        2 g 200 mL/hr over 30 Minutes  Intravenous Every 8 hours 04/21/20 1533 04/21/20 1729   04/16/20 1000  cefdinir (OMNICEF) capsule 600 mg  Status:  Discontinued        600 mg Oral Daily 04/14/20 1011 04/21/20 1533   04/06/20 1045  cefTRIAXone (ROCEPHIN) 2 g in sodium chloride 0.9 % 100 mL IVPB        2 g 200 mL/hr over 30 Minutes Intravenous Every 24 hours 04/06/20 0945 04/15/20 1726   04/05/20 1100  cefTRIAXone (ROCEPHIN) 2 g in sodium chloride 0.9 % 100 mL IVPB  Status:  Discontinued        2 g 200 mL/hr over 30 Minutes Intravenous Every 24 hours 04/05/20 1023 04/05/20 1453   04/03/20 1600  vancomycin (VANCOREADY) IVPB 750 mg/150 mL  Status:  Discontinued        750 mg 150 mL/hr over 60 Minutes Intravenous Every 12 hours 04/03/20 1500 04/05/20 1020   04/03/20 1000  remdesivir 100 mg in sodium chloride 0.9 % 100 mL IVPB  Status:  Discontinued       "Followed by" Linked Group Details   100 mg 200 mL/hr over 30 Minutes Intravenous Daily 04/02/20 0654 04/02/20 0656   04/03/20 1000  remdesivir 100 mg in sodium chloride 0.9 % 100 mL IVPB       "Followed by" Linked Group Details   100 mg 200 mL/hr over 30 Minutes Intravenous Daily 04/02/20 0658 04/06/20 0948   04/03/20 0400  vancomycin (VANCOREADY) IVPB 500 mg/100 mL  Status:  Discontinued        500 mg 100 mL/hr  over 60 Minutes Intravenous Every 12 hours 04/02/20 1459 04/03/20 1500   04/02/20 2200  piperacillin-tazobactam (ZOSYN) IVPB 3.375 g  Status:  Discontinued        3.375 g 12.5 mL/hr over 240 Minutes Intravenous Every 8 hours 04/02/20 1332 04/05/20 1023   04/02/20 1515  vancomycin (VANCOREADY) IVPB 1250 mg/250 mL  Status:  Discontinued        1,250 mg 166.7 mL/hr over 90 Minutes Intravenous Every 24 hours 04/02/20 1449 04/02/20 1449   04/02/20 1515  vancomycin (VANCOREADY) IVPB 1250 mg/250 mL        1,250 mg 166.7 mL/hr over 90 Minutes Intravenous  Once 04/02/20 1449 04/02/20 1701   04/02/20 1345  piperacillin-tazobactam (ZOSYN) IVPB 3.375 g        3.375 g 100  mL/hr over 30 Minutes Intravenous  Once 04/02/20 1332 04/02/20 1434   04/02/20 0900  remdesivir 100 mg in sodium chloride 0.9 % 100 mL IVPB       "Followed by" Linked Group Details   100 mg 200 mL/hr over 30 Minutes Intravenous  Once 04/02/20 0658 04/02/20 1251   04/02/20 0800  remdesivir 100 mg in sodium chloride 0.9 % 100 mL IVPB       "Followed by" Linked Group Details   100 mg 200 mL/hr over 30 Minutes Intravenous  Once 04/02/20 0658 04/02/20 0853   04/02/20 0700  remdesivir 200 mg in sodium chloride 0.9% 250 mL IVPB  Status:  Discontinued       "Followed by" Linked Group Details   200 mg 580 mL/hr over 30 Minutes Intravenous Once 04/02/20 0654 04/02/20 0656      Subjective  The patient is resting in bed. Pain is under better control, but still present. No new complaints.  Objective   Vitals:  Vitals:   04/23/20 0333 04/23/20 1021  BP: 103/79 110/75  Pulse: 98 98  Resp: 20 16  Temp: 98.1 F (36.7 C) 98.1 F (36.7 C)  SpO2: 98% 98%   Exam:  Constitutional:  The patient is awake, alert, and oriented x 3. Mild distress from chest pain. Respiratory:  . No increased work of breathing. . No wheezes, rales, or rhonchi . No tactile fremitus . Chest tube in place. Cardiovascular:  . Regular rate and rhythm . No murmurs, ectopy, or gallups. . No lateral PMI. No thrills. Abdomen:  . Abdomen is soft, non-tender, non-distended . No hernias, masses, or organomegaly . Normoactive bowel sounds.  Musculoskeletal:  . No cyanosis, clubbing, or edema Skin:  . No rashes, lesions, ulcers . palpation of skin: no induration or nodules Neurologic:  . CN 2-12 intact . Sensation all 4 extremities intact Psychiatric:  . Mental status o Mood, affect appropriate o Orientation to person, place, time  . judgment and insight appear intact  I have personally reviewed the following:   Today's Data  . Vitals, CBC, CMP  Micro Data  . MRSA by PCR is negative. . Blood culture x 2:  No growth . Body fluid culture: streptococcus pneumoniae . COVID-19 positive on 03/27/2020 and 04/01/2020. No isolation necessary  Imaging  . CTA chest . CXR . Repeat CT chest 04/19/2020 . Repeat CXR 04/23/2020  Cardiology Data  . Echocardiogram  Scheduled Meds: . acetaminophen  1,000 mg Oral Q6H   Or  . acetaminophen (TYLENOL) oral liquid 160 mg/5 mL  1,000 mg Oral Q6H  . acidophilus  2 capsule Oral Daily  . busPIRone  10 mg Oral TID  . cefdinir  600 mg Oral Daily  . dextromethorphan-guaiFENesin  1 tablet Oral BID  . enoxaparin (LOVENOX) injection  40 mg Subcutaneous Q24H  . feeding supplement  1 Container Oral BID BM  . feeding supplement  237 mL Oral TID BM  . fentaNYL (SUBLIMAZE) injection  12.5 mcg Intravenous Once  . lidocaine  1 patch Transdermal Q24H  . metoprolol tartrate  12.5 mg Oral BID  . multivitamin with minerals  1 tablet Oral Daily  . nicotine  14 mg Transdermal Daily  . pantoprazole  40 mg Oral Daily  . senna-docusate  2 tablet Oral BID  . traZODone  100 mg Oral QHS   Continuous Infusions: . 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  Pleural fluid culture positive for Streptococcus pneumoniae-thankfully blood cultures negative.  Chest tube in place-CT surgery following.  Recommendations from [facility] are for 2 weeks of IV Rocephin from 12/20-followed by 4 weeks of oral antimicrobial therapy  (Cefdinir).  The patient may require another thoracic procedure. The patient is now saturating 99% on room air. ID has transitioned the patient from IV ceftriaxine and vancomycin to PO Cefdinir for the 4 weeks remaining in her 6 week course of antibiotics. Follow up CT chest today has demonstrated a large left pneumothorax with small to moderate amount of loculated left pleural fluid. There was improved aeration in the left lung with residual collapse/consolidation in the left upper and left lower lobes. There is a trace of loculated pleural fluid in the minor fissure. TCTS has returned the patient to thr OR and repeated VATS with thoracotomy, decortication and drainage of empyema. Patient appears to have tolerated the procedure well. CT in place. Pain is under improved control.  Pneumothorax: Left Lung - large. Management as per CTS. CT in place.  COVID-19 infection: The patient initially tested positive at home on 03/27/2020 after having symptoms x 1 day. Has been treated with steroid/Remdesivir. Hypoxia was mostly from empyema-rather than COVID-19 related parenchymal infection. As the patient is clinically  Improved. The patient has been taken out of isolation.  Tricuspid valve regurgitation: The patient has received 2 weeks of IV ceftriaxone and vancomycin. ID has now converted her to PO cefdinir to complete the next 4 weeks of therapy.  Pericardial effusion: Seen on echo on 12/18, but resolved on echo on 12/28.    Depression/anorexia: Continue BuSpar as at home.  History of IVDA-heroin/methamphetamine: Noted.  Tobacco abuse: Continue transdermal nicotine.  Severe Protein Calorie Malnutrition: Etiology: acute illness (COVID 19 infection)/IVDA. Signs/Symptoms: moderate fat depletion,severe muscle depletion,energy intake < or equal to 50% for > or equal to 5 days. Interventions: Ensure Enlive (each supplement provides 350kcal and 20 grams of protein),Magic cup.  I have seen and examined  this patient myself. I have spent 32 minutes in her evaluation and care.  DVT prophylaxis: Lovenox CODE STATUS: Full Code Family Communication: None available Disposition: Status is: Inpatient  Remains inpatient appropriate because:IV treatments appropriate due to intensity of illness or inability to take PO  Dispo: The patient is from: Home              Anticipated d/c is to: Home              Anticipated d/c date is: > 3 days              Patient currently is not medically stable to d/c.   Fran Lowes, DO [facility] 04/22/2020 3:12 PM

## 2020-04-23 NOTE — Progress Notes (Addendum)
      [address]       [place] [address]             [phone]       2 Days Post-Op Procedure(s) (LRB): VIDEO BRONCHOSCOPY WITH BRONCHIAL WASHINGS (Bilateral) VIDEO ASSISTED THORACOSCOPY (VATS)/ MINI THORACOTOMY, DECORTICATION, DRAINAGE OF EMPYEMA, RESECTION OF NECROTIC PORTION OF LEFT LOWER LOBE (Left) INSERTION OF INTERBRONCHIAL VALVE (IBV) ANTERIOR SEGMENT #19 (Left) CHEST TUBE INSERTION x 2 USING 28 BARD AND 28 STRAIGHT TO LEFT PLEURA (Left) INTERCOSTAL NERVE BLOCK (Left)  Subjective: Patient still with incisional and chest tube pain but not crying this am.  Objective: Vital signs in last 24 hours: Temp:  [97.7 F (36.5 C)-98.5 F (36.9 C)] 98.1 F (36.7 C) (01/07 0333) Pulse Rate:  [82-109] 98 (01/07 0333) Cardiac Rhythm: Normal sinus rhythm (01/06 2037) Resp:  [13-20] 20 (01/07 0333) BP: (90-106)/(54-79) 103/79 (01/07 0333) SpO2:  [95 %-98 %] 98 % (01/07 0333)      Intake/Output from previous day: 01/06 0701 - 01/07 0700 In: 240 [P.O.:240] Out: 91 [Urine:1; Chest Tube:90]   Physical Exam:  Cardiovascular: RRR Pulmonary: Clear to auscultation on the right and diminished left lateral breath wounds Abdomen: Soft, non tender, bowel sounds present. Extremities: No lower extremity edema. Wounds: Clean and dry.  No erythema or signs of infection. Chest Tubes: to suction, tidiling with cough but no air leak  Lab Results: CBC: Recent Labs    04/22/20 0625 04/23/20 0102  WBC 6.6 5.1  HGB 8.1* 8.7*  HCT 24.5* 28.7*  PLT 388 421*   BMET:  Recent Labs    04/22/20 0625 04/23/20 0102  NA 139 141  K 4.3 4.2  CL 104 106  CO2 28 27  GLUCOSE 103* 103*  BUN 12 13  CREATININE 0.57 0.55  CALCIUM 8.1* 8.3*    PT/INR: No results for input(s): LABPROT, INR in the last 72 hours. ABG:  INR: Will add last result for INR, ABG once components are confirmed Will add last 4 CBG results once components are  confirmed  Assessment/Plan:  1. CV - SR this am. On Lopressor 12.5 mg bid. 2.  Pulmonary - On room air. Chest tube with 90 cc since surgery. Chest tubes are to suction. There is no air leak. CXR this am appears stable (space left lateral chest). Hope to remove one chest tube soon. Encourage incentive spirometer. 3. Anemia-H and H this am increased to 8.7 and 28.7 4. ID-Transitioned  to oral Cefdinir for 4 weeks for a total of 6 weeks of antibiotic therapy. Gram stain from pleural debris showed no organisms. Culture shows no growth for < 24 hours. 5. Regarding pain control. She is on Tylenol, Toradol, Oxy PRN, Dilaudid Q 4 PRN, and Xanax PRN.   Donielle M ZimmermanPA-C 04/23/2020,6:59 AM [phone] No air leak I have seen and examined Bennie Pierini and agree with the above assessment  and plan.  Delight Ovens MD Beeper [phone] Office [phone] 04/23/2020 10:10 AM

## 2020-04-24 ENCOUNTER — Inpatient Hospital Stay (HOSPITAL_COMMUNITY): Payer: Medicaid Other

## 2020-04-24 DIAGNOSIS — J869 Pyothorax without fistula: Secondary | ICD-10-CM | POA: Diagnosis not present

## 2020-04-24 NOTE — Op Note (Signed)
NAMEDAJIAH, KOOI MEDICAL RECORD TS:[id number] ACCOUNT [id number] DATE OF BIRTH:21-Jul-1985 FACILITY: [facility] LOCATION: [facility] PHYSICIAN:Zaiah Credeur B. Tyrone Sage, MD  OPERATIVE REPORT  DATE OF PROCEDURE:  04/21/2020  PREOPERATIVE DIAGNOSIS:  Persistent air leak and poor inflation of the lower lobe following presentation with acute large empyema and COVID infection. POSTOP DIAGNOSIS: Same PROCEDURE PERFORMED: Video bronchoscopy, left video-assisted thoracoscopy, minithoracotomy decortication, resection of portion left lower lobe-necrotic, placement of endobronchial 9 mm IBV valve  Surgeon: Gwenith Daily. Lowella Fairy, MD First Assistant:: Lin Landsman, PA  Brief history: Ten days before, the patient had undergone VATS with drainage of empyema and decortication.  At that time, she was still under isolation for COVID infection.  Following this, she had continued to have a lower lobe airspace and air leak.  In spite of  this, she improved her overall respiratory status and was no longer septic and came off isolation from her COVID infection.  Repeat CT scan was performed that showed good inflation of the upper lobe, but poor inflation of the lower lobe.  We recommended  repeat VATS to try to alleviate the persistent air leak and control the intrapleural space.  Risks and options of surgical procedure were discussed with the patient, she was agreeable and signed informed consent.  DESCRIPTION OF PROCEDURE:  With IV access and arterial line in place, the patient underwent general endotracheal anesthesia.  The first was with a single lumen endotracheal tube.  Appropriate timeout was performed and we proceeded with video bronchoscopy  to the subsegmental level, both the right and left tracheobronchial tree.  There was very little in the way of pulmonary secretions and no evidence of endobronchial occlusion or blockage.  The scope was removed.  A double lumen endotracheal tube was  placed in the left chest.  The  patient was turned in lateral decubitus position with the left side up.  The previously placed chest tubes that were still in were removed.  The left chest was prepped with Betadine, draped in a sterile manner.  Appropriate  second timeout was performed.  We then made a small incision anteriorly and with the Optiview scope entered the pleural space.  This gave [place] visualization of fairly adherent upper lobe.  There was a portion of the lung that was totally necrotic.  This  appeared to be a part of the lower lobe, though there to adequately deal with what we found a mini thoracotomy was performed.  Using the video scope and working through the incision as much necrotic debris as possible was removed.  The base of this area  we ventilated the left lung slightly and identified area there was air leak.  No identifiable pulmonary vessels were seen, though this area of necrotic lung was totally disintegrated.  This area was removed.  At this point, we could not do any further  decortication, but wanted to control the air leak.  The tissue and the area of air leak was very friable and was not felt to be possible to sew it with any reliability so we decided to close the incision with 2 chest tubes in place, which was done.   Incision was closed with pericostal stitches interrupted 0 Vicryl, running 3-0 Vicryl in subcutaneous tissue.  Chest tubes _____ tube placed anteriorly and a Blake drain placed posteriorly were secured in place.  Dressings were applied.  The patient was  then turned back on her back.  The double lumen endotracheal tube was changed to a single lumen endotracheal tube.  We  then placed a fiberoptic bronchoscope through the endotracheal tube, identifying the left main stem bronchus.  A 6-French Foley  catheter was placed through the bronchoscope and used first to occlude the upper lobe.  This had no effect on the air leak from the chest tube.  When this balloon was placed in the lower lobe there was  almost immediate stop of the air leak.  The 4-French  Foley was removed and a small measuring balloon that had been calibrated was then used to identify which of the lower lobe segments were the cause of the air leak.  With a balloon placed in the anterior segment of the left lower lobe, the air leak  completely stopped.  This was sized for 9 mm endobronchial valve.  The valve was positioned in discharging to the opening of the anterior segment of the lower lobe with good positioning.  This eliminated the air leak, no further valves were placed.  The  scope was then removed.  The patient was awakened and extubated in the operating room.  Sponge and needle count had been reported as correct.  Estimated blood loss was less than 100 mL.  The patient was transferred to recovery room for postoperative  care.  HN/NUANCE  D:04/23/2020 T:04/24/2020 JOB:[id number]

## 2020-04-24 NOTE — Progress Notes (Signed)
PROGRESS NOTE  Alyssa Vargas OVZ:[id number] DOB: 11-03-1985 DOA: 04/01/2020 PCP: Patient, No Pcp Per  Brief History   Patient is a 35 y.o. female with PMHx of IVDA (heroin/methamphetamine)-who tested positive for COVID-19 on 12/11 (home test)-presented to [facility] on 12/16 with dyspnea, cough-found to have a large left-sided pleural effusion-underwent diagnostic thoracocentesis on 12/17-which was consistent with empyema-subsequently seen by CT surgery-and underwent VATS/decortication on 12/20.    The patient's chest tube is still in place. She continues to have significant pain from the chest tube. Narcotic pain control is limited due to the patient's hypotension. Heart rate has also been high. It is sinus tachycardia. Likely at least in part due to pain.  ID has transitioned the patient from IV ceftriaxine and vancomycin to PO Cefdinir for the 4 weeks remaining in her 6 week course of antibiotics.  Repeat CT chest was performed on 04/20/2019. It has demonstrated a large left pneumothorax with small to moderate amount of loculated left pleural fluid. There was improved aeration in the left lung with residual collapse/consolidation in the left upper and left lower lobes. There is a trace of loculated pleural fluid in the minor fissure.  The patient was taken back to the OR on 04/21/2020 for repeat VATS, bronchoscopy and thoracotomy with drainage of empyema. The patient's pain is under improved control after adjustments were made to her pain regimen by CTS.  Consultants  . PCCM . CTS . ID  Procedures  . Thorocentesis . VATS/Decortication 04/05/21 . VATS/Decortication/Empyema drainage 04/21/2020  Antibiotics   Anti-infectives (From admission, onward)   Start     Dose/Rate Route Frequency Ordered Stop   04/22/20 1000  cefdinir (OMNICEF) capsule 600 mg        600 mg Oral Daily 04/21/20 1533 05/14/20 0959   04/21/20 1700  ceFAZolin (ANCEF) IVPB 2g/100 mL premix        2 g 200 mL/hr over 30 Minutes  Intravenous Every 8 hours 04/21/20 1533 04/21/20 1729   04/16/20 1000  cefdinir (OMNICEF) capsule 600 mg  Status:  Discontinued        600 mg Oral Daily 04/14/20 1011 04/21/20 1533   04/06/20 1045  cefTRIAXone (ROCEPHIN) 2 g in sodium chloride 0.9 % 100 mL IVPB        2 g 200 mL/hr over 30 Minutes Intravenous Every 24 hours 04/06/20 0945 04/15/20 1726   04/05/20 1100  cefTRIAXone (ROCEPHIN) 2 g in sodium chloride 0.9 % 100 mL IVPB  Status:  Discontinued        2 g 200 mL/hr over 30 Minutes Intravenous Every 24 hours 04/05/20 1023 04/05/20 1453   04/03/20 1600  vancomycin (VANCOREADY) IVPB 750 mg/150 mL  Status:  Discontinued        750 mg 150 mL/hr over 60 Minutes Intravenous Every 12 hours 04/03/20 1500 04/05/20 1020   04/03/20 1000  remdesivir 100 mg in sodium chloride 0.9 % 100 mL IVPB  Status:  Discontinued       "Followed by" Linked Group Details   100 mg 200 mL/hr over 30 Minutes Intravenous Daily 04/02/20 0654 04/02/20 0656   04/03/20 1000  remdesivir 100 mg in sodium chloride 0.9 % 100 mL IVPB       "Followed by" Linked Group Details   100 mg 200 mL/hr over 30 Minutes Intravenous Daily 04/02/20 0658 04/06/20 0948   04/03/20 0400  vancomycin (VANCOREADY) IVPB 500 mg/100 mL  Status:  Discontinued        500 mg 100 mL/hr  over 60 Minutes Intravenous Every 12 hours 04/02/20 1459 04/03/20 1500   04/02/20 2200  piperacillin-tazobactam (ZOSYN) IVPB 3.375 g  Status:  Discontinued        3.375 g 12.5 mL/hr over 240 Minutes Intravenous Every 8 hours 04/02/20 1332 04/05/20 1023   04/02/20 1515  vancomycin (VANCOREADY) IVPB 1250 mg/250 mL  Status:  Discontinued        1,250 mg 166.7 mL/hr over 90 Minutes Intravenous Every 24 hours 04/02/20 1449 04/02/20 1449   04/02/20 1515  vancomycin (VANCOREADY) IVPB 1250 mg/250 mL        1,250 mg 166.7 mL/hr over 90 Minutes Intravenous  Once 04/02/20 1449 04/02/20 1701   04/02/20 1345  piperacillin-tazobactam (ZOSYN) IVPB 3.375 g        3.375 g 100  mL/hr over 30 Minutes Intravenous  Once 04/02/20 1332 04/02/20 1434   04/02/20 0900  remdesivir 100 mg in sodium chloride 0.9 % 100 mL IVPB       "Followed by" Linked Group Details   100 mg 200 mL/hr over 30 Minutes Intravenous  Once 04/02/20 0658 04/02/20 1251   04/02/20 0800  remdesivir 100 mg in sodium chloride 0.9 % 100 mL IVPB       "Followed by" Linked Group Details   100 mg 200 mL/hr over 30 Minutes Intravenous  Once 04/02/20 0658 04/02/20 0853   04/02/20 0700  remdesivir 200 mg in sodium chloride 0.9% 250 mL IVPB  Status:  Discontinued       "Followed by" Linked Group Details   200 mg 580 mL/hr over 30 Minutes Intravenous Once 04/02/20 0654 04/02/20 0656      Subjective  The patient is resting in bed.  No new complaints.  Objective   Vitals:  Vitals:   04/24/20 0937 04/24/20 1200  BP: 119/83   Pulse: 95   Resp:    Temp: 98.7 F (37.1 C) 98.2 F (36.8 C)  SpO2:     Exam:  Constitutional:  The patient is awake, alert, and oriented x 3. No acute distress. Respiratory:  . No increased work of breathing. . No wheezes, rales, or rhonchi . No tactile fremitus . Chest tube in place. Cardiovascular:  . Regular rate and rhythm . No murmurs, ectopy, or gallups. . No lateral PMI. No thrills. Abdomen:  . Abdomen is soft, non-tender, non-distended . No hernias, masses, or organomegaly . Normoactive bowel sounds.  Musculoskeletal:  . No cyanosis, clubbing, or edema Skin:  . No rashes, lesions, ulcers . palpation of skin: no induration or nodules Neurologic:  . CN 2-12 intact . Sensation all 4 extremities intact Psychiatric:  . Mental status o Mood, affect appropriate o Orientation to person, place, time  . judgment and insight appear intact  I have personally reviewed the following:   Today's Data  . Vitals, CBC, CMP  Micro Data  . MRSA by PCR is negative. . Blood culture x 2: No growth . Body fluid culture: streptococcus pneumoniae . COVID-19 positive  on 03/27/2020 and 04/01/2020. No isolation necessary  Imaging  . CTA chest . CXR . Repeat CT chest 04/19/2020 . Repeat CXR 04/23/2020 . Repeat CXR 04/24/2020  Cardiology Data  . Echocardiogram  Scheduled Meds: . acetaminophen  1,000 mg Oral Q6H   Or  . acetaminophen (TYLENOL) oral liquid 160 mg/5 mL  1,000 mg Oral Q6H  . acidophilus  2 capsule Oral Daily  . busPIRone  10 mg Oral TID  . cefdinir  600 mg Oral Daily  .  dextromethorphan-guaiFENesin  1 tablet Oral BID  . enoxaparin (LOVENOX) injection  40 mg Subcutaneous Q24H  . feeding supplement  1 Container Oral BID BM  . feeding supplement  237 mL Oral TID BM  . fentaNYL (SUBLIMAZE) injection  12.5 mcg Intravenous Once  . lidocaine  1 patch Transdermal Q24H  . metoprolol tartrate  12.5 mg Oral BID  . multivitamin with minerals  1 tablet Oral Daily  . nicotine  14 mg Transdermal Daily  . pantoprazole  40 mg Oral Daily  . senna-docusate  2 tablet Oral BID  . traZODone  100 mg Oral QHS   Continuous Infusions: . 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  Pleural fluid culture positive for Streptococcus pneumoniae-thankfully blood cultures negative.  Chest tube in place-CT surgery following.  Recommendations from [facility] are for 2 weeks of IV Rocephin from 12/20-followed by 4 weeks of oral antimicrobial therapy (Cefdinir).  The patient may require another thoracic procedure.  The patient is now saturating 99% on room air. ID has transitioned the patient from IV ceftriaxine and vancomycin to PO Cefdinir for the 4 weeks remaining in her 6 week course of antibiotics. Follow up CT chest today has demonstrated a large left pneumothorax with small to moderate amount of loculated left pleural fluid. There was improved aeration in the left lung with residual collapse/consolidation in the left upper and left lower lobes. There is a trace of loculated pleural fluid in the minor fissure. TCTS has returned the patient to thr OR and repeated VATS with thoracotomy, decortication and drainage of empyema. Patient appears to have tolerated the procedure well. CT in place. Pain is under improved control.  Pneumothorax: Left Lung - large. Management as per CTS. CT in place.  COVID-19 infection: The patient initially tested positive at home on 03/27/2020 after having symptoms x 1 day. Has been treated with steroid/Remdesivir. Hypoxia was mostly from empyema-rather than COVID-19 related parenchymal infection. As the patient is clinically  Improved. The patient has been taken out of isolation.  Tricuspid valve regurgitation: The patient has received 2 weeks of IV ceftriaxone and vancomycin. ID has now converted her to PO cefdinir to complete the next 4 weeks of therapy.  Pericardial effusion: Seen on echo on 12/18, but resolved on echo on 12/28.    Depression/anorexia: Continue BuSpar as at home.  History of IVDA-heroin/methamphetamine: Noted.  Tobacco abuse: Continue transdermal nicotine.  Severe Protein Calorie Malnutrition: Etiology: acute illness (COVID 19 infection)/IVDA. Signs/Symptoms: moderate fat depletion,severe muscle depletion,energy intake < or equal to 50% for > or equal to 5 days. Interventions: Ensure Enlive (each supplement provides 350kcal and 20 grams of protein),Magic cup.  I have seen and examined this patient myself. I have spent 30 minutes in her evaluation and  care.  DVT prophylaxis: Lovenox CODE STATUS: Full Code Family Communication: None available Disposition: Status is: Inpatient  Remains inpatient appropriate because:IV treatments appropriate  due to intensity of illness or inability to take PO  Dispo: The patient is from: Home              Anticipated d/c is to: Home              Anticipated d/c date is: > 3 days              Patient currently is not medically stable to d/c.   Fran Lowes, DO [facility] 04/24/2020 3:49 PM

## 2020-04-24 NOTE — Progress Notes (Addendum)
[address]       [place] [address]             [phone]      3 Days Post-Op Procedure(s) (LRB): VIDEO BRONCHOSCOPY WITH BRONCHIAL WASHINGS (Bilateral) VIDEO ASSISTED THORACOSCOPY (VATS)/ MINI THORACOTOMY, DECORTICATION, DRAINAGE OF EMPYEMA, RESECTION OF NECROTIC PORTION OF LEFT LOWER LOBE (Left) INSERTION OF INTERBRONCHIAL VALVE (IBV) ANTERIOR SEGMENT #19 (Left) CHEST TUBE INSERTION x 2 USING 28 BARD AND 28 STRAIGHT TO LEFT PLEURA (Left) INTERCOSTAL NERVE BLOCK (Left) Subjective: conts to be quuite sore, breathing is reasonably comfortable  Objective: Vital signs in last 24 hours: Temp:  [97.9 F (36.6 C)-98.5 F (36.9 C)] 98.1 F (36.7 C) (01/08 0433) Pulse Rate:  [78-98] 78 (01/08 0433) Cardiac Rhythm: Normal sinus rhythm (01/08 0659) Resp:  [12-20] 15 (01/08 0433) BP: (95-114)/(63-79) 109/78 (01/08 0433) SpO2:  [97 %-100 %] 97 % (01/08 0433)  Hemodynamic parameters for last 24 hours:    Intake/Output from previous day: 01/07 0701 - 01/08 0700 In: 130 [P.O.:120] Out: 80 [Chest Tube:80] Intake/Output this shift: Total I/O In: -  Out: 30 [Chest Tube:30]  General appearance: alert, cooperative and no distress Heart: regular rate and rhythm Lungs: dim left lower fields Abdomen: benign  Lab Results: Recent Labs    04/22/20 0625 04/23/20 0102  WBC 6.6 5.1  HGB 8.1* 8.7*  HCT 24.5* 28.7*  PLT 388 421*   BMET:  Recent Labs    04/22/20 0625 04/23/20 0102  NA 139 141  K 4.3 4.2  CL 104 106  CO2 28 27  GLUCOSE 103* 103*  BUN 12 13  CREATININE 0.57 0.55  CALCIUM 8.1* 8.3*    PT/INR: No results for input(s): LABPROT, INR in the last 72 hours. ABG No results found for: PHART, HCO3, TCO2, ACIDBASEDEF, O2SAT CBG (last 3)  No results for input(s): GLUCAP in the last 72 hours.  Meds Scheduled Meds: . acetaminophen  1,000 mg Oral Q6H   Or  . acetaminophen (TYLENOL) oral liquid 160 mg/5 mL  1,000 mg Oral Q6H  .  acidophilus  2 capsule Oral Daily  . busPIRone  10 mg Oral TID  . cefdinir  600 mg Oral Daily  . dextromethorphan-guaiFENesin  1 tablet Oral BID  . enoxaparin (LOVENOX) injection  40 mg Subcutaneous Q24H  . feeding supplement  1 Container Oral BID BM  . feeding supplement  237 mL Oral TID BM  . fentaNYL (SUBLIMAZE) injection  12.5 mcg Intravenous Once  . lidocaine  1 patch Transdermal Q24H  . metoprolol tartrate  12.5 mg Oral BID  . multivitamin with minerals  1 tablet Oral Daily  . nicotine  14 mg Transdermal Daily  . pantoprazole  40 mg Oral Daily  . senna-docusate  2 tablet Oral BID  . traZODone  100 mg Oral QHS   Continuous Infusions: . 0.9 % NaCl with KCl 20 mEq / L 10 mL/hr at 04/22/20 1024   PRN Meds:.albuterol, ALPRAZolam, cyclobenzaprine, guaiFENesin-dextromethorphan, HYDROmorphone (DILAUDID) injection, hydrOXYzine, ketorolac, ondansetron (ZOFRAN) IV, oxyCODONE, zolpidem  Xrays DG CHEST PORT 1 VIEW  Result Date: 04/24/2020 CLINICAL DATA:  Pneumothorax on the left EXAM: PORTABLE CHEST 1 VIEW COMPARISON:  Yesterday FINDINGS: Left chest tubes in place. Hydropneumothorax on the left with decrease in the gas component. Residual remaining gas is moderate volume. Left bronchial valves are present and there is still volume loss on the left. Clear right chest. Normal heart size. IMPRESSION: Left hydropneumothorax with decreased  gas component. Electronically Signed   By: Marnee Spring M.D.   On: 04/24/2020 05:59   DG Chest Port 1 View  Result Date: 04/23/2020 CLINICAL DATA:  History of empyema, post left-sided chest tube placement. EXAM: PORTABLE CHEST 1 VIEW COMPARISON:  04/22/2020; 04/21/2020; 04/20/2020; 04/19/2020; chest CT-04/19/2020 FINDINGS: Grossly unchanged cardiac silhouette and mediastinal contours. Interval removal of right jugular approach central venous catheter. Otherwise, stable positioning of remaining support apparatus including both left-sided chest tube as well as  endobronchial valve at the level of the left hilum. Grossly unchanged left-sided hydropneumothorax and atelectasis/collapse of the remaining left lung. The right hemithorax remains well aerated. The right-sided pleural effusion or pneumothorax. No acute osseous abnormalities. IMPRESSION: 1. Interval removal of right jugular approach central venous catheter. Otherwise, stable positioning of remaining support apparatus. 2. Grossly unchanged left-sided hydropneumothorax and atelectasis/collapse of the remaining left lung. Electronically Signed   By: Simonne Come M.D.   On: 04/23/2020 07:11    Assessment/Plan: S/P Procedure(s) (LRB): VIDEO BRONCHOSCOPY WITH BRONCHIAL WASHINGS (Bilateral) VIDEO ASSISTED THORACOSCOPY (VATS)/ MINI THORACOTOMY, DECORTICATION, DRAINAGE OF EMPYEMA, RESECTION OF NECROTIC PORTION OF LEFT LOWER LOBE (Left) INSERTION OF INTERBRONCHIAL VALVE (IBV) ANTERIOR SEGMENT #19 (Left) CHEST TUBE INSERTION x 2 USING 28 BARD AND 28 STRAIGHT TO LEFT PLEURA (Left) INTERCOSTAL NERVE BLOCK (Left)  1 afeb, VSS 2 sats good on RA 3 CXR reviewed, some improvement , less gas component of hydropneumothorax 4 no new labs- will obtain for am 5 CT- no air leak ,only minor drainage poss reduce suction soon 6 pulm toilet and rehab as able  LOS: 22 days    Rowe Clack  PA-C Pager [phone] 04/24/2020  left chest space has decreased some  No air leak Ct tube to water seal  I have seen and examined Alyssa Vargas and agree with the above assessment  and plan.  Delight Ovens MD Beeper [phone] Office [phone] 04/24/2020 10:44 AM

## 2020-04-24 NOTE — Plan of Care (Signed)
Problem: Education: Goal: Knowledge of General Education information will improve Description: Including pain rating scale, medication(s)/side effects and non-pharmacologic comfort measures 04/24/2020 1426 by Sherlene Shams, RN Outcome: Progressing 04/24/2020 1426 by Sherlene Shams, RN Outcome: Progressing   Problem: Health Behavior/Discharge Planning: Goal: Ability to manage health-related needs will improve 04/24/2020 1426 by Sherlene Shams, RN Outcome: Progressing 04/24/2020 1426 by Sherlene Shams, RN Outcome: Progressing   Problem: Clinical Measurements: Goal: Ability to maintain clinical measurements within normal limits will improve 04/24/2020 1426 by Sherlene Shams, RN Outcome: Progressing 04/24/2020 1426 by Sherlene Shams, RN Outcome: Progressing Goal: Will remain free from infection 04/24/2020 1426 by Sherlene Shams, RN Outcome: Progressing 04/24/2020 1426 by Sherlene Shams, RN Outcome: Progressing Goal: Diagnostic test results will improve 04/24/2020 1426 by Sherlene Shams, RN Outcome: Progressing 04/24/2020 1426 by Sherlene Shams, RN Outcome: Progressing Goal: Cardiovascular complication will be avoided 04/24/2020 1426 by Sherlene Shams, RN Outcome: Progressing 04/24/2020 1426 by Sherlene Shams, RN Outcome: Progressing   Problem: Activity: Goal: Risk for activity intolerance will decrease 04/24/2020 1426 by Sherlene Shams, RN Outcome: Progressing 04/24/2020 1426 by Sherlene Shams, RN Outcome: Progressing   Problem: Nutrition: Goal: Adequate nutrition will be maintained 04/24/2020 1426 by Sherlene Shams, RN Outcome: Progressing 04/24/2020 1426 by Sherlene Shams, RN Outcome: Progressing   Problem: Coping: Goal: Level of anxiety will decrease 04/24/2020 1426 by Sherlene Shams, RN Outcome: Progressing 04/24/2020 1426 by Sherlene Shams, RN Outcome: Progressing   Problem: Pain Managment: Goal: General experience of comfort will  improve 04/24/2020 1426 by Sherlene Shams, RN Outcome: Progressing 04/24/2020 1426 by Sherlene Shams, RN Outcome: Progressing   Problem: Safety: Goal: Ability to remain free from injury will improve 04/24/2020 1426 by Sherlene Shams, RN Outcome: Progressing 04/24/2020 1426 by Sherlene Shams, RN Outcome: Progressing   Problem: Education: Goal: Knowledge of risk factors and measures for prevention of condition will improve 04/24/2020 1426 by Sherlene Shams, RN Outcome: Progressing 04/24/2020 1426 by Sherlene Shams, RN Outcome: Progressing   Problem: Coping: Goal: Psychosocial and spiritual needs will be supported 04/24/2020 1426 by Sherlene Shams, RN Outcome: Progressing 04/24/2020 1426 by Sherlene Shams, RN Outcome: Progressing   Problem: Respiratory: Goal: Will maintain a patent airway 04/24/2020 1426 by Sherlene Shams, RN Outcome: Progressing 04/24/2020 1426 by Sherlene Shams, RN Outcome: Progressing Goal: Complications related to the disease process, condition or treatment will be avoided or minimized 04/24/2020 1426 by Sherlene Shams, RN Outcome: Progressing 04/24/2020 1426 by Sherlene Shams, RN Outcome: Progressing   Problem: Education: Goal: Knowledge of disease or condition will improve 04/24/2020 1426 by Sherlene Shams, RN Outcome: Progressing 04/24/2020 1426 by Sherlene Shams, RN Outcome: Progressing Goal: Knowledge of the prescribed therapeutic regimen will improve 04/24/2020 1426 by Sherlene Shams, RN Outcome: Progressing 04/24/2020 1426 by Sherlene Shams, RN Outcome: Progressing   Problem: Activity: Goal: Risk for activity intolerance will decrease 04/24/2020 1426 by Sherlene Shams, RN Outcome: Progressing 04/24/2020 1426 by Sherlene Shams, RN Outcome: Progressing   Problem: Cardiac: Goal: Will achieve and/or maintain hemodynamic stability 04/24/2020 1426 by Sherlene Shams, RN Outcome: Progressing 04/24/2020 1426 by Sherlene Shams, RN Outcome: Progressing   Problem: Clinical Measurements: Goal: Postoperative complications will be avoided or minimized 04/24/2020 1426 by Sherlene Shams, RN Outcome: Progressing 04/24/2020 1426 by Sherlene Shams, RN Outcome: Progressing   Problem:  Respiratory: Goal: Respiratory status will improve 04/24/2020 1426 by Sherlene Shams, RN Outcome: Progressing 04/24/2020 1426 by Sherlene Shams, RN Outcome: Progressing   Problem: Pain Management: Goal: Pain level will decrease 04/24/2020 1426 by Sherlene Shams, RN Outcome: Progressing 04/24/2020 1426 by Sherlene Shams, RN Outcome: Progressing   Problem: Skin Integrity: Goal: Wound healing without signs and symptoms infection will improve 04/24/2020 1426 by Sherlene Shams, RN Outcome: Progressing 04/24/2020 1426 by Sherlene Shams, RN Outcome: Progressing

## 2020-04-25 ENCOUNTER — Inpatient Hospital Stay (HOSPITAL_COMMUNITY): Payer: Medicaid Other

## 2020-04-25 DIAGNOSIS — J869 Pyothorax without fistula: Secondary | ICD-10-CM | POA: Diagnosis not present

## 2020-04-25 LAB — CBC
HCT: 29.3 % — ABNORMAL LOW (ref 36.0–46.0)
Hemoglobin: 9 g/dL — ABNORMAL LOW (ref 12.0–15.0)
MCH: 27.5 pg (ref 26.0–34.0)
MCHC: 30.7 g/dL (ref 30.0–36.0)
MCV: 89.6 fL (ref 80.0–100.0)
Platelets: 498 10*3/uL — ABNORMAL HIGH (ref 150–400)
RBC: 3.27 MIL/uL — ABNORMAL LOW (ref 3.87–5.11)
RDW: 14.9 % (ref 11.5–15.5)
WBC: 5.1 10*3/uL (ref 4.0–10.5)
nRBC: 0 % (ref 0.0–0.2)

## 2020-04-25 LAB — BASIC METABOLIC PANEL
Anion gap: 10 (ref 5–15)
BUN: 13 mg/dL (ref 6–20)
CO2: 24 mmol/L (ref 22–32)
Calcium: 8.1 mg/dL — ABNORMAL LOW (ref 8.9–10.3)
Chloride: 104 mmol/L (ref 98–111)
Creatinine, Ser: 0.63 mg/dL (ref 0.44–1.00)
GFR, Estimated: >60 mL/min (ref >60)
Glucose, Bld: 97 mg/dL (ref 70–99)
Potassium: 4.1 mmol/L (ref 3.5–5.1)
Sodium: 138 mmol/L (ref 135–145)

## 2020-04-25 NOTE — Plan of Care (Signed)
  Problem: Education: Goal: Knowledge of General Education information will improve Description: Including pain rating scale, medication(s)/side effects and non-pharmacologic comfort measures Outcome: Progressing   Problem: Health Behavior/Discharge Planning: Goal: Ability to manage health-related needs will improve Outcome: Progressing   Problem: Clinical Measurements: Goal: Ability to maintain clinical measurements within normal limits will improve Outcome: Progressing Goal: Will remain free from infection Outcome: Progressing Goal: Diagnostic test results will improve Outcome: Progressing Goal: Cardiovascular complication will be avoided Outcome: Progressing   Problem: Activity: Goal: Risk for activity intolerance will decrease Outcome: Progressing   Problem: Nutrition: Goal: Adequate nutrition will be maintained Outcome: Progressing   Problem: Coping: Goal: Level of anxiety will decrease Outcome: Progressing   Problem: Pain Managment: Goal: General experience of comfort will improve Outcome: Progressing   Problem: Safety: Goal: Ability to remain free from injury will improve Outcome: Progressing   Problem: Education: Goal: Knowledge of risk factors and measures for prevention of condition will improve Outcome: Progressing   Problem: Coping: Goal: Psychosocial and spiritual needs will be supported Outcome: Progressing   Problem: Respiratory: Goal: Will maintain a patent airway Outcome: Progressing Goal: Complications related to the disease process, condition or treatment will be avoided or minimized Outcome: Progressing   Problem: Education: Goal: Knowledge of disease or condition will improve Outcome: Progressing Goal: Knowledge of the prescribed therapeutic regimen will improve Outcome: Progressing   Problem: Activity: Goal: Risk for activity intolerance will decrease Outcome: Progressing   Problem: Cardiac: Goal: Will achieve and/or maintain  hemodynamic stability Outcome: Progressing   Problem: Clinical Measurements: Goal: Postoperative complications will be avoided or minimized Outcome: Progressing   Problem: Respiratory: Goal: Respiratory status will improve Outcome: Progressing   Problem: Pain Management: Goal: Pain level will decrease Outcome: Progressing   Problem: Skin Integrity: Goal: Wound healing without signs and symptoms infection will improve Outcome: Progressing

## 2020-04-25 NOTE — Progress Notes (Addendum)
[address]       [place] [address]             [phone]      4 Days Post-Op Procedure(s) (LRB): VIDEO BRONCHOSCOPY WITH BRONCHIAL WASHINGS (Bilateral) VIDEO ASSISTED THORACOSCOPY (VATS)/ MINI THORACOTOMY, DECORTICATION, DRAINAGE OF EMPYEMA, RESECTION OF NECROTIC PORTION OF LEFT LOWER LOBE (Left) INSERTION OF INTERBRONCHIAL VALVE (IBV) ANTERIOR SEGMENT #19 (Left) CHEST TUBE INSERTION x 2 USING 28 BARD AND 28 STRAIGHT TO LEFT PLEURA (Left) INTERCOSTAL NERVE BLOCK (Left) Subjective: C/O pain,   Objective: Vital signs in last 24 hours: Temp:  [98.1 F (36.7 C)-98.7 F (37.1 C)] 98.1 F (36.7 C) (01/09 0324) Pulse Rate:  [87-95] 89 (01/09 0324) Cardiac Rhythm: Normal sinus rhythm (01/09 0718) Resp:  [15-27] 16 (01/09 0324) BP: (106-119)/(74-86) 106/79 (01/09 0324) SpO2:  [99 %-100 %] 99 % (01/09 0324)  Hemodynamic parameters for last 24 hours:    Intake/Output from previous day: 01/08 0701 - 01/09 0700 In: 240 [P.O.:240] Out: 70 [Chest Tube:70] Intake/Output this shift: No intake/output data recorded.  General appearance: alert, cooperative and no distress Heart: regular rate and rhythm Lungs: dim on left Abdomen: mild diffuse tenderness Extremities: no edema Wound: dressings clean  Lab Results: Recent Labs    04/23/20 0102 04/25/20 0114  WBC 5.1 5.1  HGB 8.7* 9.0*  HCT 28.7* 29.3*  PLT 421* 498*   BMET:  Recent Labs    04/23/20 0102 04/25/20 0114  NA 141 138  K 4.2 4.1  CL 106 104  CO2 27 24  GLUCOSE 103* 97  BUN 13 13  CREATININE 0.55 0.63  CALCIUM 8.3* 8.1*    PT/INR: No results for input(s): LABPROT, INR in the last 72 hours. ABG No results found for: PHART, HCO3, TCO2, ACIDBASEDEF, O2SAT CBG (last 3)  No results for input(s): GLUCAP in the last 72 hours.  Meds Scheduled Meds: . acetaminophen  1,000 mg Oral Q6H   Or  . acetaminophen (TYLENOL) oral liquid 160 mg/5 mL  1,000 mg Oral Q6H  . acidophilus  2  capsule Oral Daily  . busPIRone  10 mg Oral TID  . cefdinir  600 mg Oral Daily  . dextromethorphan-guaiFENesin  1 tablet Oral BID  . enoxaparin (LOVENOX) injection  40 mg Subcutaneous Q24H  . feeding supplement  1 Container Oral BID BM  . feeding supplement  237 mL Oral TID BM  . fentaNYL (SUBLIMAZE) injection  12.5 mcg Intravenous Once  . lidocaine  1 patch Transdermal Q24H  . metoprolol tartrate  12.5 mg Oral BID  . multivitamin with minerals  1 tablet Oral Daily  . nicotine  14 mg Transdermal Daily  . pantoprazole  40 mg Oral Daily  . senna-docusate  2 tablet Oral BID  . traZODone  100 mg Oral QHS   Continuous Infusions: . 0.9 % NaCl with KCl 20 mEq / L 10 mL/hr at 04/22/20 1024   PRN Meds:.albuterol, ALPRAZolam, cyclobenzaprine, guaiFENesin-dextromethorphan, HYDROmorphone (DILAUDID) injection, hydrOXYzine, ketorolac, ondansetron (ZOFRAN) IV, oxyCODONE, zolpidem  Xrays DG CHEST PORT 1 VIEW  Result Date: 04/25/2020 CLINICAL DATA:  Surgery follow-up. EXAM: PORTABLE CHEST 1 VIEW COMPARISON:  Chest x-rays dated 04/24/2020 and 04/23/2020 FINDINGS: LEFT-sided chest tubes are stable in position. Stable appearance of the LEFT-sided hydropneumothorax and volume loss. RIGHT lung remains clear. Heart size and mediastinal contours are stable. LEFT bronchial valve appears stable in position. IMPRESSION: Stable chest x-ray. Stable appearance of the LEFT-sided hydropneumothorax and LEFT-sided volume  loss. LEFT-sided chest tubes are stable in position. Electronically Signed   By: Bary Richard M.D.   On: 04/25/2020 07:04   DG CHEST PORT 1 VIEW  Result Date: 04/24/2020 CLINICAL DATA:  Pneumothorax on the left EXAM: PORTABLE CHEST 1 VIEW COMPARISON:  Yesterday FINDINGS: Left chest tubes in place. Hydropneumothorax on the left with decrease in the gas component. Residual remaining gas is moderate volume. Left bronchial valves are present and there is still volume loss on the left. Clear right chest. Normal  heart size. IMPRESSION: Left hydropneumothorax with decreased gas component. Electronically Signed   By: Marnee Spring M.D.   On: 04/24/2020 05:59    Results for orders placed or performed during the hospital encounter of 04/01/20  Resp Panel by RT-PCR (Flu A&B, Covid) Nasopharyngeal Swab     Status: Abnormal   Collection Time: 04/01/20 10:42 PM   Specimen: Nasopharyngeal Swab; Nasopharyngeal(NP) swabs in vial transport medium  Result Value Ref Range Status   SARS Coronavirus 2 by RT PCR POSITIVE (A) NEGATIVE Final    Comment: RESULT CALLED TO, READ BACK BY AND VERIFIED WITH: T WALKER,RN@2337  04/01/20 MKELLY (NOTE) SARS-CoV-2 target nucleic acids are DETECTED.  The SARS-CoV-2 RNA is generally detectable in upper respiratory specimens during the acute phase of infection. Positive results are indicative of the presence of the identified virus, but do not rule out bacterial infection or co-infection with other pathogens not detected by the test. Clinical correlation with patient history and other diagnostic information is necessary to determine patient infection status. The expected result is Negative.  Fact Sheet for Patients: BloggerCourse.com  Fact Sheet for Healthcare Providers: SeriousBroker.it  This test is not yet approved or cleared by the [place] FDA and  has been authorized for detection and/or diagnosis of SARS-CoV-2 by FDA under an Emergency Use Authorization (EUA).  This EUA will remain in effect (meaning this test can be  used) for the duration of  the COVID-19 declaration under Section 564(b)(1) of the Act, 21 U.S.C. section 360bbb-3(b)(1), unless the authorization is terminated or revoked sooner.     Influenza A by PCR NEGATIVE NEGATIVE Final   Influenza B by PCR NEGATIVE NEGATIVE Final    Comment: (NOTE) The Xpert Xpress SARS-CoV-2/FLU/RSV plus assay is intended as an aid in the diagnosis of influenza from  Nasopharyngeal swab specimens and should not be used as a sole basis for treatment. Nasal washings and aspirates are unacceptable for Xpert Xpress SARS-CoV-2/FLU/RSV testing.  Fact Sheet for Patients: BloggerCourse.com  Fact Sheet for Healthcare Providers: SeriousBroker.it  This test is not yet approved or cleared by the [place] FDA and has been authorized for detection and/or diagnosis of SARS-CoV-2 by FDA under an Emergency Use Authorization (EUA). This EUA will remain in effect (meaning this test can be used) for the duration of the COVID-19 declaration under Section 564(b)(1) of the Act, 21 U.S.C. section 360bbb-3(b)(1), unless the authorization is terminated or revoked.  Performed at [facility], [address], [place], [place] [address]   Gram stain     Status: None   Collection Time: 04/02/20 11:40 AM   Specimen: Pleura; Body Fluid  Result Value Ref Range Status   Specimen Description PLEURAL  Final   Special Requests NONE  Final   Gram Stain   Final    GRAM POSITIVE COCCI Gram Stain Report Called to,Read Back By and Verified With: EASTER,T@1417  by MATTHEWS, B 12.17.21 WBC PRESENT,BOTH PMN AND MONONUCLEAR Performed at [facility], [address]  [place], [place] [address]    Report Status 04/02/2020 FINAL  Final  Culture, body fluid-bottle     Status: Abnormal   Collection Time: 04/02/20 11:40 AM   Specimen: Pleura  Result Value Ref Range Status   Specimen Description   Final    PLEURAL Performed at [facility], [address] [place], [place] [address]    Special Requests   Final    NONE Performed at [facility], [address] [place], [place] [address]    Gram Stain   Final    GRAM POSITIVE COCCI AEROBIC BOTTLE Gram Stain Report Called to,Read Back By and Verified With: EASTER,T ON 04/02/20 AT 2040 BY LOY,C Performed at [facility] ANAEROBIC BOTTLE ALSO Performed at [facility],  [address] [place], [place] [address]    Culture STREPTOCOCCUS PNEUMONIAE (A)  Final   Report Status 04/05/2020 FINAL  Final   Organism ID, Bacteria STREPTOCOCCUS PNEUMONIAE  Final      Susceptibility   Streptococcus pneumoniae - MIC*    ERYTHROMYCIN <=0.12 SENSITIVE Sensitive     LEVOFLOXACIN 1 SENSITIVE Sensitive     VANCOMYCIN 0.5 SENSITIVE Sensitive     PENICILLIN (meningitis) <=0.06 SENSITIVE Sensitive     PENO - penicillin <=0.06      PENICILLIN (non-meningitis) <=0.06 SENSITIVE Sensitive     PENICILLIN (oral) <=0.06 SENSITIVE Sensitive     CEFTRIAXONE (non-meningitis) <=0.12 SENSITIVE Sensitive     CEFTRIAXONE (meningitis) <=0.12 SENSITIVE Sensitive     * STREPTOCOCCUS PNEUMONIAE  Culture, blood (Routine X 2) w Reflex to ID Panel     Status: None   Collection Time: 04/02/20  2:48 PM   Specimen: BLOOD  Result Value Ref Range Status   Specimen Description BLOOD LEFT ANTECUBITAL  Final   Special Requests   Final    BOTTLES DRAWN AEROBIC AND ANAEROBIC Blood Culture adequate volume   Culture   Final    NO GROWTH 5 DAYS Performed at [facility], [address] [place], [place] [address]    Report Status 04/08/2020 FINAL  Final  Culture, blood (Routine X 2) w Reflex to ID Panel     Status: None   Collection Time: 04/02/20  9:54 PM   Specimen: BLOOD  Result Value Ref Range Status   Specimen Description BLOOD RIGHT HAND  Final   Special Requests   Final    BOTTLES DRAWN AEROBIC ONLY Blood Culture adequate volume   Culture   Final    NO GROWTH 5 DAYS Performed at [facility], [address] [place], [place] [address]    Report Status 04/07/2020 FINAL  Final  Culture, blood (Routine X 2) w Reflex to ID Panel     Status: None   Collection Time: 04/02/20  9:54 PM   Specimen: BLOOD  Result Value Ref Range Status   Specimen Description BLOOD RIGHT HAND  Final   Special Requests   Final    BOTTLES DRAWN AEROBIC ONLY Blood Culture adequate volume   Culture   Final    NO  GROWTH 5 DAYS Performed at [facility], [address] [place], [place] [address]    Report Status 04/07/2020 FINAL  Final  MRSA PCR Screening     Status: None   Collection Time: 04/03/20  7:11 AM   Specimen: Nasal Mucosa; Nasopharyngeal  Result Value Ref Range Status   MRSA by PCR NEGATIVE NEGATIVE Final    Comment:        The  GeneXpert MRSA Assay (FDA approved for NASAL specimens only), is one component of a comprehensive MRSA colonization surveillance program. It is not intended to diagnose MRSA infection nor to guide or monitor treatment for MRSA infections. Performed at [facility], [address] [place], [place] [address]   Culture, respiratory     Status: None   Collection Time: 04/21/20  9:15 AM   Specimen: Bronchial Washing, Left; Respiratory  Result Value Ref Range Status   Specimen Description BRONCHIAL ALVEOLAR LAVAGE  Final   Special Requests BRONCHIAL WASHINGS SPEC A  Final   Gram Stain   Final    RARE WBC PRESENT, PREDOMINANTLY MONONUCLEAR NO ORGANISMS SEEN    Culture   Final    NO GROWTH 2 DAYS Performed at [facility], [address], [place], [place] [address]    Report Status 04/23/2020 FINAL  Final  Aerobic/Anaerobic Culture (surgical/deep wound)     Status: None (Preliminary result)   Collection Time: 04/21/20 10:04 AM   Specimen: Pleural, Left; Body Fluid  Result Value Ref Range Status   Specimen Description TISSUE  Final   Special Requests PLEURAL DEBRIS SPEC B  Final   Gram Stain   Final    MODERATE WBC PRESENT,BOTH PMN AND MONONUCLEAR NO ORGANISMS SEEN    Culture   Final    NO GROWTH 3 DAYS NO ANAEROBES ISOLATED; CULTURE IN PROGRESS FOR 5 DAYS Performed at [facility], [address], [place], [place] [address]    Report Status PENDING  Incomplete    Assessment/Plan: S/P Procedure(s) (LRB): VIDEO BRONCHOSCOPY WITH BRONCHIAL WASHINGS (Bilateral) VIDEO ASSISTED THORACOSCOPY (VATS)/ MINI THORACOTOMY,  DECORTICATION, DRAINAGE OF EMPYEMA, RESECTION OF NECROTIC PORTION OF LEFT LOWER LOBE (Left) INSERTION OF INTERBRONCHIAL VALVE (IBV) ANTERIOR SEGMENT #19 (Left) CHEST TUBE INSERTION x 2 USING 28 BARD AND 28 STRAIGHT TO LEFT PLEURA (Left) INTERCOSTAL NERVE BLOCK (Left)  1 afeb, VSS, sinus rhythm 2 sats good RA 3 renal fxn is stable 4 no leukocytosis 5 H/H slightly improved anemia 6 CXR is stable in appearance 7 BS stable control 8 conts omnicef 9 CT 70 cc, tiny air leak- poss remove a tube soon    LOS: 23 days    Rowe Clack PA-C Pager [phone] 04/25/2020  Chest xray stable still with inferior space, no air leak on my exam- poss remove one chest tube tomorrow I have seen and examined Alyssa Vargas and agree with the above assessment  and plan.  Delight Ovens MD Beeper [phone] Office [phone] 04/25/2020 12:39 PM

## 2020-04-25 NOTE — Plan of Care (Signed)
  Problem: Education: Goal: Knowledge of General Education information will improve Description: Including pain rating scale, medication(s)/side effects and non-pharmacologic comfort measures Outcome: Progressing   Problem: Health Behavior/Discharge Planning: Goal: Ability to manage health-related needs will improve Outcome: Progressing   Problem: Clinical Measurements: Goal: Ability to maintain clinical measurements within normal limits will improve Outcome: Progressing Goal: Will remain free from infection Outcome: Progressing Goal: Diagnostic test results will improve Outcome: Progressing Goal: Cardiovascular complication will be avoided Outcome: Progressing   Problem: Activity: Goal: Risk for activity intolerance will decrease Outcome: Progressing   Problem: Nutrition: Goal: Adequate nutrition will be maintained Outcome: Progressing   Problem: Coping: Goal: Level of anxiety will decrease Outcome: Progressing   Problem: Pain Managment: Goal: General experience of comfort will improve Outcome: Progressing   Problem: Safety: Goal: Ability to remain free from injury will improve Outcome: Progressing   Problem: Coping: Goal: Psychosocial and spiritual needs will be supported Outcome: Progressing   Problem: Respiratory: Goal: Will maintain a patent airway Outcome: Progressing Goal: Complications related to the disease process, condition or treatment will be avoided or minimized Outcome: Progressing   Problem: Education: Goal: Knowledge of disease or condition will improve Outcome: Progressing Goal: Knowledge of the prescribed therapeutic regimen will improve Outcome: Progressing   Problem: Activity: Goal: Risk for activity intolerance will decrease Outcome: Progressing   Problem: Cardiac: Goal: Will achieve and/or maintain hemodynamic stability Outcome: Progressing   Problem: Clinical Measurements: Goal: Postoperative complications will be avoided or  minimized Outcome: Progressing   Problem: Respiratory: Goal: Respiratory status will improve Outcome: Progressing   Problem: Pain Management: Goal: Pain level will decrease Outcome: Progressing   Problem: Skin Integrity: Goal: Wound healing without signs and symptoms infection will improve Outcome: Progressing

## 2020-04-25 NOTE — Progress Notes (Signed)
PROGRESS NOTE  Alyssa Vargas JOA:[id number] DOB: 1986-01-04 DOA: 04/01/2020 PCP: Patient, No Pcp Per  Brief History   Patient is a 35 y.o. female with PMHx of IVDA (heroin/methamphetamine)-who tested positive for COVID-19 on 12/11 (home test)-presented to [facility] on 12/16 with dyspnea, cough-found to have a large left-sided pleural effusion-underwent diagnostic thoracocentesis on 12/17-which was consistent with empyema-subsequently seen by CT surgery-and underwent VATS/decortication on 12/20.    The patient's chest tube is still in place. She continues to have significant pain from the chest tube. Narcotic pain control is limited due to the patient's hypotension. Heart rate has also been high. It is sinus tachycardia. Likely at least in part due to pain.  ID has transitioned the patient from IV ceftriaxine and vancomycin to PO Cefdinir for the 4 weeks remaining in her 6 week course of antibiotics.  Repeat CT chest was performed on 04/20/2019. It has demonstrated a large left pneumothorax with small to moderate amount of loculated left pleural fluid. There was improved aeration in the left lung with residual collapse/consolidation in the left upper and left lower lobes. There is a trace of loculated pleural fluid in the minor fissure.  The patient was taken back to the OR on 04/21/2020 for repeat VATS, bronchoscopy and thoracotomy with drainage of empyema. The patient's pain is under improved control after adjustments were made to her pain regimen by CTS.  CTS is contemplating removal of a CT tomorrow pending results of CXR.  Consultants  . PCCM . CTS . ID  Procedures  . Thorocentesis . VATS/Decortication 04/05/21 . VATS/Decortication/Empyema drainage 04/21/2020  Antibiotics   Anti-infectives (From admission, onward)   Start     Dose/Rate Route Frequency Ordered Stop   04/22/20 1000  cefdinir (OMNICEF) capsule 600 mg        600 mg Oral Daily 04/21/20 1533 05/14/20 0959   04/21/20 1700   ceFAZolin (ANCEF) IVPB 2g/100 mL premix        2 g 200 mL/hr over 30 Minutes Intravenous Every 8 hours 04/21/20 1533 04/21/20 1729   04/16/20 1000  cefdinir (OMNICEF) capsule 600 mg  Status:  Discontinued        600 mg Oral Daily 04/14/20 1011 04/21/20 1533   04/06/20 1045  cefTRIAXone (ROCEPHIN) 2 g in sodium chloride 0.9 % 100 mL IVPB        2 g 200 mL/hr over 30 Minutes Intravenous Every 24 hours 04/06/20 0945 04/15/20 1726   04/05/20 1100  cefTRIAXone (ROCEPHIN) 2 g in sodium chloride 0.9 % 100 mL IVPB  Status:  Discontinued        2 g 200 mL/hr over 30 Minutes Intravenous Every 24 hours 04/05/20 1023 04/05/20 1453   04/03/20 1600  vancomycin (VANCOREADY) IVPB 750 mg/150 mL  Status:  Discontinued        750 mg 150 mL/hr over 60 Minutes Intravenous Every 12 hours 04/03/20 1500 04/05/20 1020   04/03/20 1000  remdesivir 100 mg in sodium chloride 0.9 % 100 mL IVPB  Status:  Discontinued       "Followed by" Linked Group Details   100 mg 200 mL/hr over 30 Minutes Intravenous Daily 04/02/20 0654 04/02/20 0656   04/03/20 1000  remdesivir 100 mg in sodium chloride 0.9 % 100 mL IVPB       "Followed by" Linked Group Details   100 mg 200 mL/hr over 30 Minutes Intravenous Daily 04/02/20 0658 04/06/20 0948   04/03/20 0400  vancomycin (VANCOREADY) IVPB 500 mg/100 mL  Status:  Discontinued        500 mg 100 mL/hr over 60 Minutes Intravenous Every 12 hours 04/02/20 1459 04/03/20 1500   04/02/20 2200  piperacillin-tazobactam (ZOSYN) IVPB 3.375 g  Status:  Discontinued        3.375 g 12.5 mL/hr over 240 Minutes Intravenous Every 8 hours 04/02/20 1332 04/05/20 1023   04/02/20 1515  vancomycin (VANCOREADY) IVPB 1250 mg/250 mL  Status:  Discontinued        1,250 mg 166.7 mL/hr over 90 Minutes Intravenous Every 24 hours 04/02/20 1449 04/02/20 1449   04/02/20 1515  vancomycin (VANCOREADY) IVPB 1250 mg/250 mL        1,250 mg 166.7 mL/hr over 90 Minutes Intravenous  Once 04/02/20 1449 04/02/20 1701    04/02/20 1345  piperacillin-tazobactam (ZOSYN) IVPB 3.375 g        3.375 g 100 mL/hr over 30 Minutes Intravenous  Once 04/02/20 1332 04/02/20 1434   04/02/20 0900  remdesivir 100 mg in sodium chloride 0.9 % 100 mL IVPB       "Followed by" Linked Group Details   100 mg 200 mL/hr over 30 Minutes Intravenous  Once 04/02/20 0658 04/02/20 1251   04/02/20 0800  remdesivir 100 mg in sodium chloride 0.9 % 100 mL IVPB       "Followed by" Linked Group Details   100 mg 200 mL/hr over 30 Minutes Intravenous  Once 04/02/20 0658 04/02/20 0853   04/02/20 0700  remdesivir 200 mg in sodium chloride 0.9% 250 mL IVPB  Status:  Discontinued       "Followed by" Linked Group Details   200 mg 580 mL/hr over 30 Minutes Intravenous Once 04/02/20 0654 04/02/20 0656      Subjective  The patient is resting in bed.  She continues to complain of pain.  Objective   Vitals:  Vitals:   04/24/20 2335 04/25/20 0324  BP: 108/75 106/79  Pulse: 87 89  Resp: 15 16  Temp: 98.5 F (36.9 C) 98.1 F (36.7 C)  SpO2: 99% 99%   Exam:  Constitutional:  The patient is awake, alert, and oriented x 3. No acute distress. Respiratory:  . No increased work of breathing. . No wheezes, rales, or rhonchi . No tactile fremitus . Chest tube in place. Cardiovascular:  . Regular rate and rhythm . No murmurs, ectopy, or gallups. . No lateral PMI. No thrills. Abdomen:  . Abdomen is soft, non-tender, non-distended . No hernias, masses, or organomegaly . Normoactive bowel sounds.  Musculoskeletal:  . No cyanosis, clubbing, or edema Skin:  . No rashes, lesions, ulcers . palpation of skin: no induration or nodules Neurologic:  . CN 2-12 intact . Sensation all 4 extremities intact Psychiatric:  . Mental status o Mood, affect appropriate o Orientation to person, place, time  . judgment and insight appear intact  I have personally reviewed the following:   Today's Data  . Vitals, CBC, CMP  Micro Data  . MRSA by  PCR is negative. . Blood culture x 2: No growth . Body fluid culture: streptococcus pneumoniae . COVID-19 positive on 03/27/2020 and 04/01/2020. No isolation necessary  Imaging  . CTA chest . CXR . Repeat CT chest 04/19/2020 . Repeat CXR 04/23/2020 . Repeat CXR 04/24/2020  Cardiology Data  . Echocardiogram  Scheduled Meds: . acetaminophen  1,000 mg Oral Q6H   Or  . acetaminophen (TYLENOL) oral liquid 160 mg/5 mL  1,000 mg Oral Q6H  . acidophilus  2 capsule Oral Daily  .  busPIRone  10 mg Oral TID  . cefdinir  600 mg Oral Daily  . dextromethorphan-guaiFENesin  1 tablet Oral BID  . enoxaparin (LOVENOX) injection  40 mg Subcutaneous Q24H  . feeding supplement  1 Container Oral BID BM  . feeding supplement  237 mL Oral TID BM  . fentaNYL (SUBLIMAZE) injection  12.5 mcg Intravenous Once  . lidocaine  1 patch Transdermal Q24H  . metoprolol tartrate  12.5 mg Oral BID  . multivitamin with minerals  1 tablet Oral Daily  . nicotine  14 mg Transdermal Daily  . pantoprazole  40 mg Oral Daily  . senna-docusate  2 tablet Oral BID  . traZODone  100 mg Oral QHS   Continuous Infusions: . 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  Pleural fluid culture positive for Streptococcus pneumoniae-thankfully blood cultures negative.  Chest tube in place-CT surgery following.  Recommendations from [facility] are for 2 weeks of IV Rocephin from  12/20-followed by 4 weeks of oral antimicrobial therapy (Cefdinir).  The patient may require another thoracic procedure. The patient is now saturating 99% on room air. ID has transitioned the patient from IV ceftriaxine and vancomycin to PO Cefdinir for the 4 weeks remaining in her 6 week course of antibiotics. Follow up CT chest today has demonstrated a large left pneumothorax with small to moderate amount of loculated left pleural fluid. There was improved aeration in the left lung with residual collapse/consolidation in the left upper and left lower lobes. There is a trace of loculated pleural fluid in the minor fissure. TCTS has returned the patient to thr OR and repeated VATS with thoracotomy, decortication and drainage of empyema. Patient appears to have tolerated the procedure well. CT in place. Pain is under improved control, although still a major complaint of the patient.  Pneumothorax: Left Lung - large. Management as per CTS. CT in place.  COVID-19 infection: The patient initially tested positive at home on 03/27/2020 after having symptoms x 1 day. Has been treated with steroid/Remdesivir. Hypoxia was mostly from empyema-rather than COVID-19 related parenchymal infection. As the patient is clinically  Improved. The patient has been taken out of isolation.  Tricuspid valve regurgitation: The patient has received 2 weeks of IV ceftriaxone and vancomycin. ID has now converted her to PO cefdinir to complete the next 4 weeks of therapy.  Pericardial effusion: Seen on echo on 12/18, but resolved on echo on 12/28.    Depression/anorexia: Continue BuSpar as at home.  History of IVDA-heroin/methamphetamine: Noted.  Tobacco abuse: Continue transdermal nicotine.  Severe Protein Calorie Malnutrition: Etiology: acute illness (COVID 19 infection)/IVDA. Signs/Symptoms: moderate fat depletion,severe muscle depletion,energy intake < or equal to 50% for > or equal to 5 days. Interventions: Ensure  Enlive (each supplement provides 350kcal and 20 grams of protein),Magic cup.  I have seen and examined this patient myself. I have spent 30 minutes in her evaluation and  care.  DVT prophylaxis: Lovenox CODE STATUS: Full Code Family Communication: None available Disposition: Status is: Inpatient  Remains inpatient appropriate because:IV treatments appropriate due to intensity of illness or inability to take PO  Dispo: The patient is from: Home              Anticipated d/c is to: Home              Anticipated d/c date is: > 3 days              Patient currently is not medically stable to d/c.   Fran Lowes, DO [facility] 04/25/2020 1:47 PM

## 2020-04-26 ENCOUNTER — Inpatient Hospital Stay (HOSPITAL_COMMUNITY): Payer: Medicaid Other

## 2020-04-26 LAB — AEROBIC/ANAEROBIC CULTURE W GRAM STAIN (SURGICAL/DEEP WOUND): Culture: NO GROWTH

## 2020-04-26 MED ORDER — HYDROMORPHONE HCL 1 MG/ML IJ SOLN
0.5000 mg | Freq: Four times a day (QID) | INTRAMUSCULAR | Status: DC | PRN
  Administered 2020-04-26 – 2020-04-27 (×3): 0.5 mg via INTRAVENOUS
  Filled 2020-04-26 (×3): qty 0.5

## 2020-04-26 NOTE — Progress Notes (Addendum)
      [address]       [place] [id number]             [phone]       5 Days Post-Op Procedure(s) (LRB): VIDEO BRONCHOSCOPY WITH BRONCHIAL WASHINGS (Bilateral) VIDEO ASSISTED THORACOSCOPY (VATS)/ MINI THORACOTOMY, DECORTICATION, DRAINAGE OF EMPYEMA, RESECTION OF NECROTIC PORTION OF LEFT LOWER LOBE (Left) INSERTION OF INTERBRONCHIAL VALVE (IBV) ANTERIOR SEGMENT #19 (Left) CHEST TUBE INSERTION x 2 USING 28 BARD AND 28 STRAIGHT TO LEFT PLEURA (Left) INTERCOSTAL NERVE BLOCK (Left)  Subjective: Patient in good spirits this am. She had a long walk yesterday. Patient would like to go outside with staff member on "nice weather days". She still has pain at chest tube sites and incision.  Objective: Vital signs in last 24 hours: Temp:  [97.6 F (36.4 C)-98.7 F (37.1 C)] 98 F (36.7 C) (01/10 0731) Pulse Rate:  [81-95] 88 (01/10 0718) Cardiac Rhythm: Normal sinus rhythm (01/10 0717) Resp:  [14-22] 14 (01/10 0731) BP: (94-133)/(58-92) 133/92 (01/10 0731) SpO2:  [96 %-99 %] 97 % (01/10 0718)      Intake/Output from previous day: 01/09 0701 - 01/10 0700 In: -  Out: 82 [Urine:1; Stool:1; Chest Tube:80]   Physical Exam:  Cardiovascular: RRR Pulmonary: Clear to auscultation on the right and diminished left lateral breath wounds Abdomen: Soft, non tender, bowel sounds present. Extremities: No lower extremity edema. Wounds: Clean and dry.  No erythema or signs of infection. Chest Tubes: to water seal, air leak with cough  Lab Results: CBC: Recent Labs    04/25/20 0114  WBC 5.1  HGB 9.0*  HCT 29.3*  PLT 498*   BMET:  Recent Labs    04/25/20 0114  NA 138  K 4.1  CL 104  CO2 24  GLUCOSE 97  BUN 13  CREATININE 0.63  CALCIUM 8.1*    PT/INR: No results for input(s): LABPROT, INR in the last 72 hours. ABG:  INR: Will add last result for INR, ABG once components are confirmed Will add last 4 CBG results once components are  confirmed  Assessment/Plan:  1. CV - SR this am. On Lopressor 12.5 mg bid. 2.  Pulmonary - On room air. Chest tube with 80 cc since surgery. Chest tubes are to water seal. There is an air leak with cough. No CXR ordered for this am so will order. Hope to remove one chest tube soon. Encourage incentive spirometer. 3. Anemia-H and H this am increased to 8.7 and 28.7 4. ID-Transitioned  to oral Cefdinir for 4 weeks for a total of 6 weeks of antibiotic therapy. Gram stain from pleural debris showed no organisms. Culture shows no growth for 2 days. 5. Ok from [place] for patient to go outside with staff member  Lelon Huh [facility] 04/26/2020,7:40 AM [phone]  Chest xray reviewed, will d/c one chest tube today I have seen and examined Alyssa Vargas and agree with the above assessment  and plan.  Delight Ovens MD Beeper [phone] Office [phone] 04/26/2020 8:53 AM

## 2020-04-26 NOTE — Plan of Care (Signed)
  Problem: Education: Goal: Knowledge of General Education information will improve Description: Including pain rating scale, medication(s)/side effects and non-pharmacologic comfort measures Outcome: Progressing   Problem: Health Behavior/Discharge Planning: Goal: Ability to manage health-related needs will improve Outcome: Progressing   Problem: Clinical Measurements: Goal: Ability to maintain clinical measurements within normal limits will improve Outcome: Progressing Goal: Will remain free from infection Outcome: Progressing Goal: Diagnostic test results will improve Outcome: Progressing Goal: Cardiovascular complication will be avoided Outcome: Progressing   Problem: Activity: Goal: Risk for activity intolerance will decrease Outcome: Progressing   Problem: Coping: Goal: Level of anxiety will decrease Outcome: Progressing   Problem: Pain Managment: Goal: General experience of comfort will improve Outcome: Progressing   Problem: Safety: Goal: Ability to remain free from injury will improve Outcome: Progressing   Problem: Education: Goal: Knowledge of risk factors and measures for prevention of condition will improve Outcome: Progressing   Problem: Coping: Goal: Psychosocial and spiritual needs will be supported Outcome: Progressing   Problem: Respiratory: Goal: Will maintain a patent airway Outcome: Progressing Goal: Complications related to the disease process, condition or treatment will be avoided or minimized Outcome: Progressing   Problem: Activity: Goal: Risk for activity intolerance will decrease Outcome: Progressing   Problem: Clinical Measurements: Goal: Postoperative complications will be avoided or minimized Outcome: Progressing   Problem: Respiratory: Goal: Respiratory status will improve Outcome: Progressing   Problem: Pain Management: Goal: Pain level will decrease Outcome: Progressing   Problem: Skin Integrity: Goal: Wound healing  without signs and symptoms infection will improve Outcome: Progressing

## 2020-04-26 NOTE — Progress Notes (Signed)
PROGRESS NOTE  Alyssa Vargas JOA:[id number] DOB: 1986-01-04 DOA: 04/01/2020 PCP: Patient, No Pcp Per  Brief History   Patient is a 35 y.o. female with PMHx of IVDA (heroin/methamphetamine)-who tested positive for COVID-19 on 12/11 (home test)-presented to [facility] on 12/16 with dyspnea, cough-found to have a large left-sided pleural effusion-underwent diagnostic thoracocentesis on 12/17-which was consistent with empyema-subsequently seen by CT surgery-and underwent VATS/decortication on 12/20.    The patient's chest tube is still in place. She continues to have significant pain from the chest tube. Narcotic pain control is limited due to the patient's hypotension. Heart rate has also been high. It is sinus tachycardia. Likely at least in part due to pain.  ID has transitioned the patient from IV ceftriaxine and vancomycin to PO Cefdinir for the 4 weeks remaining in her 6 week course of antibiotics.  Repeat CT chest was performed on 04/20/2019. It has demonstrated a large left pneumothorax with small to moderate amount of loculated left pleural fluid. There was improved aeration in the left lung with residual collapse/consolidation in the left upper and left lower lobes. There is a trace of loculated pleural fluid in the minor fissure.  The patient was taken back to the OR on 04/21/2020 for repeat VATS, bronchoscopy and thoracotomy with drainage of empyema. The patient's pain is under improved control after adjustments were made to her pain regimen by CTS.  CTS is contemplating removal of a CT tomorrow pending results of CXR.  Consultants  . PCCM . CTS . ID  Procedures  . Thorocentesis . VATS/Decortication 04/05/21 . VATS/Decortication/Empyema drainage 04/21/2020  Antibiotics   Anti-infectives (From admission, onward)   Start     Dose/Rate Route Frequency Ordered Stop   04/22/20 1000  cefdinir (OMNICEF) capsule 600 mg        600 mg Oral Daily 04/21/20 1533 05/14/20 0959   04/21/20 1700   ceFAZolin (ANCEF) IVPB 2g/100 mL premix        2 g 200 mL/hr over 30 Minutes Intravenous Every 8 hours 04/21/20 1533 04/21/20 1729   04/16/20 1000  cefdinir (OMNICEF) capsule 600 mg  Status:  Discontinued        600 mg Oral Daily 04/14/20 1011 04/21/20 1533   04/06/20 1045  cefTRIAXone (ROCEPHIN) 2 g in sodium chloride 0.9 % 100 mL IVPB        2 g 200 mL/hr over 30 Minutes Intravenous Every 24 hours 04/06/20 0945 04/15/20 1726   04/05/20 1100  cefTRIAXone (ROCEPHIN) 2 g in sodium chloride 0.9 % 100 mL IVPB  Status:  Discontinued        2 g 200 mL/hr over 30 Minutes Intravenous Every 24 hours 04/05/20 1023 04/05/20 1453   04/03/20 1600  vancomycin (VANCOREADY) IVPB 750 mg/150 mL  Status:  Discontinued        750 mg 150 mL/hr over 60 Minutes Intravenous Every 12 hours 04/03/20 1500 04/05/20 1020   04/03/20 1000  remdesivir 100 mg in sodium chloride 0.9 % 100 mL IVPB  Status:  Discontinued       "Followed by" Linked Group Details   100 mg 200 mL/hr over 30 Minutes Intravenous Daily 04/02/20 0654 04/02/20 0656   04/03/20 1000  remdesivir 100 mg in sodium chloride 0.9 % 100 mL IVPB       "Followed by" Linked Group Details   100 mg 200 mL/hr over 30 Minutes Intravenous Daily 04/02/20 0658 04/06/20 0948   04/03/20 0400  vancomycin (VANCOREADY) IVPB 500 mg/100 mL  Status:  Discontinued        500 mg 100 mL/hr over 60 Minutes Intravenous Every 12 hours 04/02/20 1459 04/03/20 1500   04/02/20 2200  piperacillin-tazobactam (ZOSYN) IVPB 3.375 g  Status:  Discontinued        3.375 g 12.5 mL/hr over 240 Minutes Intravenous Every 8 hours 04/02/20 1332 04/05/20 1023   04/02/20 1515  vancomycin (VANCOREADY) IVPB 1250 mg/250 mL  Status:  Discontinued        1,250 mg 166.7 mL/hr over 90 Minutes Intravenous Every 24 hours 04/02/20 1449 04/02/20 1449   04/02/20 1515  vancomycin (VANCOREADY) IVPB 1250 mg/250 mL        1,250 mg 166.7 mL/hr over 90 Minutes Intravenous  Once 04/02/20 1449 04/02/20 1701    04/02/20 1345  piperacillin-tazobactam (ZOSYN) IVPB 3.375 g        3.375 g 100 mL/hr over 30 Minutes Intravenous  Once 04/02/20 1332 04/02/20 1434   04/02/20 0900  remdesivir 100 mg in sodium chloride 0.9 % 100 mL IVPB       "Followed by" Linked Group Details   100 mg 200 mL/hr over 30 Minutes Intravenous  Once 04/02/20 0658 04/02/20 1251   04/02/20 0800  remdesivir 100 mg in sodium chloride 0.9 % 100 mL IVPB       "Followed by" Linked Group Details   100 mg 200 mL/hr over 30 Minutes Intravenous  Once 04/02/20 0658 04/02/20 0853   04/02/20 0700  remdesivir 200 mg in sodium chloride 0.9% 250 mL IVPB  Status:  Discontinued       "Followed by" Linked Group Details   200 mg 580 mL/hr over 30 Minutes Intravenous Once 04/02/20 0654 04/02/20 0656      Subjective  The patient is resting in bed.  The patient is happy to hear that she may be having CT removed tomorrow.  Objective   Vitals:  Vitals:   04/26/20 0731 04/26/20 1113  BP: (!) 133/92 118/79  Pulse:  90  Resp: 14 17  Temp: 98 F (36.7 C) 98.6 F (37 C)  SpO2:  98%   Exam:  Constitutional:  The patient is awake, alert, and oriented x 3. No acute distress. Respiratory:  . No increased work of breathing. . No wheezes, rales, or rhonchi . No tactile fremitus . Chest tube in place. Cardiovascular:  . Regular rate and rhythm . No murmurs, ectopy, or gallups. . No lateral PMI. No thrills. Abdomen:  . Abdomen is soft, non-tender, non-distended . No hernias, masses, or organomegaly . Normoactive bowel sounds.  Musculoskeletal:  . No cyanosis, clubbing, or edema Skin:  . No rashes, lesions, ulcers . palpation of skin: no induration or nodules Neurologic:  . CN 2-12 intact . Sensation all 4 extremities intact Psychiatric:  . Mental status o Mood, affect appropriate o Orientation to person, place, time  . judgment and insight appear intact  I have personally reviewed the following:   Today's Data   . Dispensing optician  . MRSA by PCR is negative. . Blood culture x 2: No growth . Body fluid culture: streptococcus pneumoniae . COVID-19 positive on 03/27/2020 and 04/01/2020. No isolation necessary  Imaging  . CTA chest . CXR . Repeat CT chest 04/19/2020 . Repeat CXR 04/23/2020 . Repeat CXR 04/24/2020  Cardiology Data  . Echocardiogram  Scheduled Meds: . acetaminophen  1,000 mg Oral Q6H   Or  . acetaminophen (TYLENOL) oral liquid 160 mg/5 mL  1,000 mg Oral Q6H  . acidophilus  2 capsule Oral Daily  . busPIRone  10 mg Oral TID  . cefdinir  600 mg Oral Daily  . dextromethorphan-guaiFENesin  1 tablet Oral BID  . enoxaparin (LOVENOX) injection  40 mg Subcutaneous Q24H  . feeding supplement  1 Container Oral BID BM  . feeding supplement  237 mL Oral TID BM  . fentaNYL (SUBLIMAZE) injection  12.5 mcg Intravenous Once  . lidocaine  1 patch Transdermal Q24H  . metoprolol tartrate  12.5 mg Oral BID  . multivitamin with minerals  1 tablet Oral Daily  . nicotine  14 mg Transdermal Daily  . pantoprazole  40 mg Oral Daily  . senna-docusate  2 tablet Oral BID  . traZODone  100 mg Oral QHS   Continuous Infusions: . 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  Pleural fluid culture positive for Streptococcus pneumoniae-thankfully blood cultures negative.  Chest tube in place-CT surgery following.  Recommendations from [facility] are  for 2 weeks of IV Rocephin from 12/20-followed by 4 weeks of oral antimicrobial therapy (Cefdinir).  The patient may require another thoracic procedure. The patient is now saturating 99% on room air. ID has transitioned the patient from IV ceftriaxine and vancomycin to PO Cefdinir for the 4 weeks remaining in her 6 week course of antibiotics. Follow up CT chest today has demonstrated a large left pneumothorax with small to moderate amount of loculated left pleural fluid. There was improved aeration in the left lung with residual collapse/consolidation in the left upper and left lower lobes. There is a trace of loculated pleural fluid in the minor fissure. TCTS has returned the patient to thr OR and repeated VATS with thoracotomy, decortication and drainage of empyema. Patient appears to have tolerated the procedure well. CT in place. CTS has said that they will probably remove it tomorrow. Pain is under improved control, although still a major complaint of the patient.  Pneumothorax: Left Lung - large. Management as per CTS. CT in place.  COVID-19 infection: The patient initially tested positive at home on 03/27/2020 after having symptoms x 1 day. Has been treated with steroid/Remdesivir. Hypoxia was mostly from empyema-rather than COVID-19 related parenchymal infection. As the patient is clinically  Improved. The patient has been taken out of isolation.  Tricuspid valve regurgitation: The patient has received 2 weeks of IV ceftriaxone and vancomycin. ID has now converted her to PO cefdinir to complete the next 4 weeks of therapy.  Pericardial effusion: Seen on echo on 12/18, but resolved on echo on 12/28.    Depression/anorexia: Continue BuSpar as at home.  History of IVDA-heroin/methamphetamine: Noted.  Tobacco abuse: Continue transdermal nicotine.  Severe Protein Calorie Malnutrition: Etiology: acute illness (COVID 19 infection)/IVDA. Signs/Symptoms: moderate fat depletion,severe muscle  depletion,energy intake < or equal to 50% for > or equal to 5 days. Interventions: Ensure Enlive (each supplement provides 350kcal and 20 grams of protein),Magic cup.  I  have seen and examined this patient myself. I have spent 32 minutes in her evaluation and care.  DVT prophylaxis: Lovenox CODE STATUS: Full Code Family Communication: None available Disposition: Status is: Inpatient  Remains inpatient appropriate because: Seriousness of illness, need for ongoing management of chest tubes and pain.  Dispo: The patient is from: Home              Anticipated d/c is to: Home              Anticipated d/c date is: 2-3 days              Patient currently is not medically stable to d/c.   Fran Lowes, DO [facility] 04/26/2020 1:00 PM

## 2020-04-26 NOTE — Anesthesia Procedure Notes (Signed)
Anesthesia Procedure Image    

## 2020-04-26 NOTE — Addendum Note (Signed)
Addendum  created 04/26/20 0933 by Eilene Ghazi, MD   Child order released for a procedure order, Clinical Note Signed, Intraprocedure Blocks edited

## 2020-04-26 NOTE — Progress Notes (Signed)
      [address]       [place] [address]             [phone]       A barrier to sending her home was identified during progression this morning. She has been taking IV dilaudid q 4 hours. We will try to slowly wean her off this medication since she will not be taking IV dilaudid at home. I will change to q6 hours to facilitate the wean.    Jari Favre, PA-C

## 2020-04-27 ENCOUNTER — Inpatient Hospital Stay (HOSPITAL_COMMUNITY): Payer: Medicaid Other

## 2020-04-27 MED ORDER — NICOTINE 7 MG/24HR TD PT24
7.0000 mg | MEDICATED_PATCH | Freq: Every day | TRANSDERMAL | Status: DC
  Filled 2020-04-27 (×4): qty 1

## 2020-04-27 MED ORDER — HYDROMORPHONE HCL 1 MG/ML IJ SOLN
0.5000 mg | Freq: Three times a day (TID) | INTRAMUSCULAR | Status: DC | PRN
  Administered 2020-04-27 – 2020-04-28 (×4): 0.5 mg via INTRAVENOUS
  Filled 2020-04-27 (×4): qty 0.5

## 2020-04-27 NOTE — Progress Notes (Addendum)
      [address]       [place] [address]             [phone]       6 Days Post-Op Procedure(s) (LRB): VIDEO BRONCHOSCOPY WITH BRONCHIAL WASHINGS (Bilateral) VIDEO ASSISTED THORACOSCOPY (VATS)/ MINI THORACOTOMY, DECORTICATION, DRAINAGE OF EMPYEMA, RESECTION OF NECROTIC PORTION OF LEFT LOWER LOBE (Left) INSERTION OF INTERBRONCHIAL VALVE (IBV) ANTERIOR SEGMENT #19 (Left) CHEST TUBE INSERTION x 2 USING 28 BARD AND 28 STRAIGHT TO LEFT PLEURA (Left) INTERCOSTAL NERVE BLOCK (Left)  Subjective: Patient has pain at chest tube and burning under left breast.  Objective: Vital signs in last 24 hours: Temp:  [98 F (36.7 C)-98.6 F (37 C)] 98.3 F (36.8 C) (01/11 0300) Pulse Rate:  [80-93] 82 (01/11 0300) Cardiac Rhythm: Normal sinus rhythm (01/11 0700) Resp:  [14-22] 20 (01/11 0300) BP: (110-133)/(75-92) 114/86 (01/11 0300) SpO2:  [91 %-99 %] 97 % (01/11 0300)      Intake/Output from previous day: 01/10 0701 - 01/11 0700 In: 380 [P.O.:360] Out: 50 [Chest Tube:50]   Physical Exam:  Cardiovascular: RRR Pulmonary: Clear to auscultation on the right and diminished left lateral breath wounds Abdomen: Soft, non tender, bowel sounds present. Extremities: No lower extremity edema. Wounds: Aquacel removed this am. Wounds are all, clean and dry, no sign of infection. Chest Tube: to water seal, air leak with cough  Lab Results: CBC: Recent Labs    04/25/20 0114  WBC 5.1  HGB 9.0*  HCT 29.3*  PLT 498*   BMET:  Recent Labs    04/25/20 0114  NA 138  K 4.1  CL 104  CO2 24  GLUCOSE 97  BUN 13  CREATININE 0.63  CALCIUM 8.1*    PT/INR: No results for input(s): LABPROT, INR in the last 72 hours. ABG:  INR: Will add last result for INR, ABG once components are confirmed Will add last 4 CBG results once components are confirmed  Assessment/Plan:  1. CV - SR this am. On Lopressor 12.5 mg bid. 2.  Pulmonary - On room air. Chest tube with 80 cc  since surgery. Chest tube is to water seal. There is an air leak with cough.  CXR appears relatively stable (left hydropneumothorax, ? Infiltrate on right). Chest tube to remain for now. Encourage incentive spirometer. 3. Anemia-H and H yesterday increased to 9 and 29.3 4. ID-Transitioned  to oral Cefdinir for 4 weeks for a total of 6 weeks of antibiotic therapy. Gram stain from pleural debris showed no organisms. Culture shows no growth for 2 days. 5. Ok from [place] for patient to go outside with staff member  Lelon Huh [facility] 04/27/2020,7:27 AM [phone]  Will move to po pain meds and d/c iv use. Before D/C will need chronic pain /narcotic use plan in effective with identified MD responsible for substance abuse treatment - Thoracic surgery office will not be managing this. Patient had previously been on [facility]adone through [facility]- does not remember providers name but would like to be set up to got there  Small air leak present  - leave chest tube in - poss home with [employer]  chest xray with space , has decreased in size. I have seen and examined Bennie Pierini and agree with the above assessment  and plan.  Delight Ovens MD Beeper [phone] Office [phone] 04/27/2020 8:41 AM

## 2020-04-27 NOTE — Progress Notes (Signed)
PROGRESS NOTE  Alyssa Vargas MCN:[id number] DOB: 03-19-1986 DOA: 04/01/2020 PCP: Patient, No Pcp Per  Brief History   Patient is a 35 y.o. female with PMHx of IVDA (heroin/methamphetamine)-who tested positive for COVID-19 on 12/11 (home test)-presented to [facility] on 12/16 with dyspnea, cough-found to have a large left-sided pleural effusion-underwent diagnostic thoracocentesis on 12/17-which was consistent with empyema-subsequently seen by CT surgery-and underwent VATS/decortication on 12/20.    The patient's chest tube is still in place. She continues to have significant pain from the chest tube. Narcotic pain control is limited due to the patient's hypotension. Heart rate has also been high. It is sinus tachycardia. Likely at least in part due to pain.  ID has transitioned the patient from IV ceftriaxine and vancomycin to PO Cefdinir for the 4 weeks remaining in her 6 week course of antibiotics.  Repeat CT chest was performed on 04/20/2019. It has demonstrated a large left pneumothorax with small to moderate amount of loculated left pleural fluid. There was improved aeration in the left lung with residual collapse/consolidation in the left upper and left lower lobes. There is a trace of loculated pleural fluid in the minor fissure.  The patient was taken back to the OR on 04/21/2020 for repeat VATS, bronchoscopy and thoracotomy with drainage of empyema. The patient's pain is under improved control after adjustments were made to her pain regimen by CTS.  Chest tube 1/2 removed on 04/26/2020. CTS is contemplating leaving second tube in at time of discharge. Pt will need to have a substance abuse rehab plan in place at discharge. The patient  had previously been on Methadone through [facility] [place], [place], but she does not remember providers name. Shewould like to be set up to go there. Consult made to [facility].  Consultants  . PCCM . CTS . ID  Procedures   . Thorocentesis . VATS/Decortication 04/05/21 . VATS/Decortication/Empyema drainage 04/21/2020 . Removal 1/2 chest tube 04/26/2020.  Antibiotics   Anti-infectives (From admission, onward)   Start     Dose/Rate Route Frequency Ordered Stop   04/22/20 1000  cefdinir (OMNICEF) capsule 600 mg        600 mg Oral Daily 04/21/20 1533 05/14/20 0959   04/21/20 1700  ceFAZolin (ANCEF) IVPB 2g/100 mL premix        2 g 200 mL/hr over 30 Minutes Intravenous Every 8 hours 04/21/20 1533 04/21/20 1729   04/16/20 1000  cefdinir (OMNICEF) capsule 600 mg  Status:  Discontinued        600 mg Oral Daily 04/14/20 1011 04/21/20 1533   04/06/20 1045  cefTRIAXone (ROCEPHIN) 2 g in sodium chloride 0.9 % 100 mL IVPB        2 g 200 mL/hr over 30 Minutes Intravenous Every 24 hours 04/06/20 0945 04/15/20 1726   04/05/20 1100  cefTRIAXone (ROCEPHIN) 2 g in sodium chloride 0.9 % 100 mL IVPB  Status:  Discontinued        2 g 200 mL/hr over 30 Minutes Intravenous Every 24 hours 04/05/20 1023 04/05/20 1453   04/03/20 1600  vancomycin (VANCOREADY) IVPB 750 mg/150 mL  Status:  Discontinued        750 mg 150 mL/hr over 60 Minutes Intravenous Every 12 hours 04/03/20 1500 04/05/20 1020   04/03/20 1000  remdesivir 100 mg in sodium chloride 0.9 % 100 mL IVPB  Status:  Discontinued       "Followed by" Linked Group Details   100 mg 200 mL/hr over 30 Minutes Intravenous  Daily 04/02/20 0654 04/02/20 0656   04/03/20 1000  remdesivir 100 mg in sodium chloride 0.9 % 100 mL IVPB       "Followed by" Linked Group Details   100 mg 200 mL/hr over 30 Minutes Intravenous Daily 04/02/20 0658 04/06/20 0948   04/03/20 0400  vancomycin (VANCOREADY) IVPB 500 mg/100 mL  Status:  Discontinued        500 mg 100 mL/hr over 60 Minutes Intravenous Every 12 hours 04/02/20 1459 04/03/20 1500   04/02/20 2200  piperacillin-tazobactam (ZOSYN) IVPB 3.375 g  Status:  Discontinued        3.375 g 12.5 mL/hr over 240 Minutes Intravenous Every 8 hours  04/02/20 1332 04/05/20 1023   04/02/20 1515  vancomycin (VANCOREADY) IVPB 1250 mg/250 mL  Status:  Discontinued        1,250 mg 166.7 mL/hr over 90 Minutes Intravenous Every 24 hours 04/02/20 1449 04/02/20 1449   04/02/20 1515  vancomycin (VANCOREADY) IVPB 1250 mg/250 mL        1,250 mg 166.7 mL/hr over 90 Minutes Intravenous  Once 04/02/20 1449 04/02/20 1701   04/02/20 1345  piperacillin-tazobactam (ZOSYN) IVPB 3.375 g        3.375 g 100 mL/hr over 30 Minutes Intravenous  Once 04/02/20 1332 04/02/20 1434   04/02/20 0900  remdesivir 100 mg in sodium chloride 0.9 % 100 mL IVPB       "Followed by" Linked Group Details   100 mg 200 mL/hr over 30 Minutes Intravenous  Once 04/02/20 0658 04/02/20 1251   04/02/20 0800  remdesivir 100 mg in sodium chloride 0.9 % 100 mL IVPB       "Followed by" Linked Group Details   100 mg 200 mL/hr over 30 Minutes Intravenous  Once 04/02/20 0658 04/02/20 0853   04/02/20 0700  remdesivir 200 mg in sodium chloride 0.9% 250 mL IVPB  Status:  Discontinued       "Followed by" Linked Group Details   200 mg 580 mL/hr over 30 Minutes Intravenous Once 04/02/20 0654 04/02/20 0656      Subjective  The patient is resting in bed.  She states that her pain is improved.  Objective   Vitals:  Vitals:   04/27/20 0908 04/27/20 1118  BP: 108/78 106/78  Pulse: 99 99  Resp:  20  Temp:  98.1 F (36.7 C)  SpO2:  94%   Exam:  Constitutional:  The patient is awake, alert, and oriented x 3. No acute distress. Respiratory:  . No increased work of breathing. . No wheezes, rales, or rhonchi . No tactile fremitus . Chest tube in place. Cardiovascular:  . Regular rate and rhythm . No murmurs, ectopy, or gallups. . No lateral PMI. No thrills. Abdomen:  . Abdomen is soft, non-tender, non-distended . No hernias, masses, or organomegaly . Normoactive bowel sounds.  Musculoskeletal:  . No cyanosis, clubbing, or edema Skin:  . No rashes, lesions, ulcers . palpation  of skin: no induration or nodules Neurologic:  . CN 2-12 intact . Sensation all 4 extremities intact Psychiatric:  . Mental status o Mood, affect appropriate o Orientation to person, place, time  . judgment and insight appear intact  I have personally reviewed the following:   Today's Data  . Dispensing optician  . MRSA by PCR is negative. . Blood culture x 2: No growth . Body fluid culture: streptococcus pneumoniae . COVID-19 positive on 03/27/2020 and 04/01/2020. No isolation necessary  Imaging  . CTA chest .  CXR . Repeat CT chest 04/19/2020 . Repeat CXR 04/23/2020 . Repeat CXR 04/24/2020  Cardiology Data  . Echocardiogram  Scheduled Meds: . acidophilus  2 capsule Oral Daily  . busPIRone  10 mg Oral TID  . cefdinir  600 mg Oral Daily  . dextromethorphan-guaiFENesin  1 tablet Oral BID  . enoxaparin (LOVENOX) injection  40 mg Subcutaneous Q24H  . feeding supplement  1 Container Oral BID BM  . feeding supplement  237 mL Oral TID BM  . lidocaine  1 patch Transdermal Q24H  . metoprolol tartrate  12.5 mg Oral BID  . multivitamin with minerals  1 tablet Oral Daily  . nicotine  7 mg Transdermal Daily  . pantoprazole  40 mg Oral Daily  . senna-docusate  2 tablet Oral BID  . traZODone  100 mg Oral QHS   Continuous Infusions: . 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  Pleural fluid culture positive for  Streptococcus pneumoniae-thankfully blood cultures negative.  Chest tube in place-CT surgery following.  Recommendations from [facility] are for 2 weeks of IV Rocephin from 12/20-followed by 4 weeks of oral antimicrobial therapy (Cefdinir).  The patient may require another thoracic procedure. The patient is now saturating 99% on room air. ID has transitioned the patient from IV ceftriaxine and vancomycin to PO Cefdinir for the 4 weeks remaining in her 6 week course of antibiotics. Follow up CT chest today has demonstrated a large left pneumothorax with small to moderate amount of loculated left pleural fluid. There was improved aeration in the left lung with residual collapse/consolidation in the left upper and left lower lobes. There is a trace of loculated pleural fluid in the minor fissure. TCTS has returned the patient to thr OR and repeated VATS with thoracotomy, decortication and drainage of empyema. Patient appears to have tolerated the procedure well. CTx2 in place, but one of them was removed on 04/26/2020. Pain is under improved control, although still a major complaint of the patient's care. CTS is contemplating leaving second tube in at time of discharge. Pt will need to have a substance abuse rehab plan in place at discharge. The patient had [facility] [place] [place], but she does not remember providers name. Shewould like to be set up to go there. [facility]   Pneumothorax: Left Lung - large. Management as per CTS. CT in place.  COVID-19 infection: The patient initially tested positive at home on 03/27/2020 after having symptoms x 1 day. Has been treated with steroid/Remdesivir. Hypoxia was mostly from empyema-rather than COVID-19 related parenchymal infection. As the patient is clinically  Improved. The patient has been taken out of isolation.  Tricuspid valve regurgitation: The patient has received 2 weeks of IV ceftriaxone and  vancomycin. ID has now converted her to PO cefdinir to complete the next 4 weeks of therapy.  Pericardial effusion: Seen  on echo on 12/18, but resolved on echo on 12/28.    Depression/anorexia: Continue BuSpar as at home.  History of IVDA-heroin/methamphetamine: Noted.  Tobacco abuse: Continue transdermal nicotine.  Severe Protein Calorie Malnutrition: Etiology: acute illness (COVID 19 infection)/IVDA. Signs/Symptoms: moderate fat depletion,severe muscle depletion,energy intake < or equal to 50% for > or equal to 5 days. Interventions: Ensure Enlive (each supplement provides 350kcal and 20 grams of protein),Magic cup.  I have seen and examined this patient myself. I have spent 36 minutes in her evaluation and care.  DVT prophylaxis: Lovenox CODE STATUS: Full Code Family Communication: None available Disposition: Status is: Inpatient  Remains inpatient appropriate because: Seriousness of illness, need for ongoing management of chest tubes and pain.  Dispo: The patient is from: Home              Anticipated d/c is to: Home              Anticipated d/c date is: 2-3 days              Patient currently is not medically stable to d/c.   Fran Lowes, DO [facility] 04/27/2020 2:52 PM

## 2020-04-27 NOTE — Plan of Care (Signed)
  Problem: Education: Goal: Knowledge of General Education information will improve Description: Including pain rating scale, medication(s)/side effects and non-pharmacologic comfort measures Outcome: Progressing   Problem: Health Behavior/Discharge Planning: Goal: Ability to manage health-related needs will improve Outcome: Progressing   Problem: Clinical Measurements: Goal: Ability to maintain clinical measurements within normal limits will improve Outcome: Progressing Goal: Will remain free from infection Outcome: Progressing Goal: Diagnostic test results will improve Outcome: Progressing Goal: Cardiovascular complication will be avoided Outcome: Progressing   Problem: Activity: Goal: Risk for activity intolerance will decrease Outcome: Progressing   Problem: Coping: Goal: Level of anxiety will decrease Outcome: Progressing   Problem: Pain Managment: Goal: General experience of comfort will improve Outcome: Progressing   Problem: Safety: Goal: Ability to remain free from injury will improve Outcome: Progressing   Problem: Education: Goal: Knowledge of risk factors and measures for prevention of condition will improve Outcome: Progressing   Problem: Coping: Goal: Psychosocial and spiritual needs will be supported Outcome: Progressing   Problem: Respiratory: Goal: Will maintain a patent airway Outcome: Progressing Goal: Complications related to the disease process, condition or treatment will be avoided or minimized Outcome: Progressing   Problem: Activity: Goal: Risk for activity intolerance will decrease Outcome: Progressing   Problem: Clinical Measurements: Goal: Postoperative complications will be avoided or minimized Outcome: Progressing   Problem: Respiratory: Goal: Respiratory status will improve Outcome: Progressing   Problem: Pain Management: Goal: Pain level will decrease Outcome: Progressing   Problem: Skin Integrity: Goal: Wound healing  without signs and symptoms infection will improve Outcome: Progressing   

## 2020-04-28 ENCOUNTER — Inpatient Hospital Stay (HOSPITAL_COMMUNITY): Payer: Medicaid Other

## 2020-04-28 LAB — BASIC METABOLIC PANEL
Anion gap: 11 (ref 5–15)
BUN: 11 mg/dL (ref 6–20)
CO2: 26 mmol/L (ref 22–32)
Calcium: 9.1 mg/dL (ref 8.9–10.3)
Chloride: 99 mmol/L (ref 98–111)
Creatinine, Ser: 0.6 mg/dL (ref 0.44–1.00)
GFR, Estimated: >60 mL/min (ref >60)
Glucose, Bld: 95 mg/dL (ref 70–99)
Potassium: 4.5 mmol/L (ref 3.5–5.1)
Sodium: 136 mmol/L (ref 135–145)

## 2020-04-28 MED ORDER — ACETAMINOPHEN 500 MG PO TABS
1000.0000 mg | ORAL_TABLET | Freq: Four times a day (QID) | ORAL | Status: DC | PRN
  Administered 2020-04-29: 1000 mg via ORAL
  Filled 2020-04-28 (×2): qty 2

## 2020-04-28 MED ORDER — KETOROLAC TROMETHAMINE 15 MG/ML IJ SOLN
15.0000 mg | Freq: Four times a day (QID) | INTRAMUSCULAR | Status: AC
  Administered 2020-04-28 – 2020-04-29 (×4): 15 mg via INTRAVENOUS
  Filled 2020-04-28 (×4): qty 1

## 2020-04-28 MED ORDER — OXYCODONE HCL 5 MG PO TABS
5.0000 mg | ORAL_TABLET | ORAL | Status: DC | PRN
  Administered 2020-04-28 – 2020-04-29 (×5): 5 mg via ORAL
  Filled 2020-04-28 (×5): qty 1

## 2020-04-28 MED ORDER — VENLAFAXINE HCL 25 MG PO TABS
25.0000 mg | ORAL_TABLET | Freq: Two times a day (BID) | ORAL | Status: DC
  Filled 2020-04-28: qty 1

## 2020-04-28 NOTE — TOC Progression Note (Signed)
Transition of Care [facility]) - Progression Note    Patient Details  Name: Alyssa Vargas MRN: [id number] Date of Birth: 1985/11/23  Transition of Care [facility]) CM/SW [place], [place] Phone Number: 04/28/2020, 3:44 PM  Clinical Narrative:     CSW met with pt regarding substance use consult and note requesting [facility] referral. Pt states that she was on methadone at [facility] in [place] 4 years ago. Pt states now that she does not want to be started on methadone or suboxone since she has been off of it. Pt states that she mentioned [facility] because they provided counseling which is really what she is seeking.   Pt explains she is seeking outpatient individual therapy and psychiatry. Pt Wants to look into [facility] in [place] for such services. Pt does not want substance use services and instead wants services for anxiety and depression.   Pt plans to live with her mother in [place] once d/c'ed. She is agreeable for CSW to contact her mother since mother was also working on finding appointments.   Expected Discharge Plan: Home/Self Care Barriers to Discharge: Continued Medical Work up  Expected Discharge Plan and Services Expected Discharge Plan: Home/Self Care   Discharge Planning Services: CM Consult   Living arrangements for the past 2 months: Single Family Home                                       Social Determinants of Health (SDOH) Interventions    Readmission Risk Interventions No flowsheet data found.

## 2020-04-28 NOTE — Progress Notes (Signed)
Nutrition Follow-up  RD working remotely.  DOCUMENTATION CODES:   Severe malnutrition in context of acute illness/injury,Underweight  INTERVENTION:   - Recommend obtaining updated weight, last available weight is from 04/02/20  - ContinueEnsure Enlive poTID, each supplement provides 350 kcal and 20 grams of protein  - d/c Boost Breeze as pt is refusing  - ContinueMagic cup TID with meals, each supplement provides 290 kcal and 9 grams of protein  - Family bringing in food for pt to eat with meals and for snacks  - Encourage adequate PO intake  NUTRITION DIAGNOSIS:   Severe Malnutrition related to acute illness (COVID 19 infection) as evidenced by moderate fat depletion,severe muscle depletion,energy intake < or equal to 50% for > or equal to 5 days.  Ongoing  GOAL:   Patient will meet greater than or equal to 90% of their needs  Progressing  MONITOR:   Supplement acceptance,Weight trends,PO intake,Labs,I & O's  REASON FOR ASSESSMENT:   Malnutrition Screening Tool    ASSESSMENT:   35 y.o. female with PMHx of IVDA (heroin/methamphetamine)-positive for COVID-19 on 12/11-presents with dyspnea, cough-found to have a large left-sided pleural effusion-underwent diagnostic thoracocentesis on 12/17-which was consistent with empyema-subsequently seen by CT surgery-and underwent VATS/decortication on 12/20.  1/05 - s/p VATS, left mini thoracotomy, decortication, resection of portion of necrotic left lung, insertion of interbronchial valve  Spoke with pt via phone call to room. Pt reports that she has an excellent appetite and is finishing all of her meals. Pt states that she has been very hungry. Pt reports that her mother is bringing in outside food for her to have with meals and snack on between meals. Pt reports that she snacks all throughout the day. Pt has been consuming at least 1 Ensure supplement daily, typically in the evening before bed.  RD encouraged  continued adequate PO intake with a focus on protein-rich foods to maintain lean muscle mass. Pt expresses understanding.  Meal Completion: 30-100% (meal completion data is incomplete)  Medications reviewed and include: Boost Breeze BID, Ensure Enlive TID, MVI with minerals daily, protonix, senna  Labs reviewed.  CT: 0 ml x 24 hours  Diet Order:   Diet Order            Diet regular Room service appropriate? Yes; Fluid consistency: Thin  Diet effective now                 EDUCATION NEEDS:   Education needs have been addressed  Skin:  Skin Assessment: Skin Integrity Issues: Incisions: left chest x 2  Last BM:  04/26/20  Height:   Ht Readings from Last 1 Encounters:  04/02/20 5\' 7"  (1.702 m)    Weight:   Wt Readings from Last 1 Encounters:  04/02/20 50.2 kg    Ideal Body Weight:  61.4 kg  BMI:  Body mass index is 17.33 kg/m.  Estimated Nutritional Needs:   Kcal:  2000-2200  Protein:  110-125 grams  Fluid:  > 2 L    04/04/20, MS, RD, LDN Inpatient Clinical Dietitian Please see AMiON for contact information.

## 2020-04-28 NOTE — Progress Notes (Signed)
Per Dr. Tyrone Sage patient removed form [facility] [place] to a mini express box. RN at bedside along with buddy RN. Chest tube dressing changed.  Dr. Tyrone Sage back at bedside to answer patients questions. RN will continue to monitor.

## 2020-04-28 NOTE — Progress Notes (Signed)
Progress Note    Alyssa Vargas  CHE:[id number] DOB: [birth year]  DOA: 04/01/2020 PCP: Patient, No Pcp Per    Brief Narrative:     Medical records reviewed and are as summarized below:  Alyssa Vargas is an 35 y.o. female with medical history significant for tobacco abuse who presents to the emergency department due to 1 week of worsening shortness of breath associated with cough, loss of sense of taste with decreased appetite and generalized body aches.  ID has transitioned the patient from IV ceftriaxine and vancomycin to PO Cefdinir for the 4 weeks remaining in her 6 week course of antibiotics. Repeat CT chest was performed on 04/20/2019. It has demonstrated a large left pneumothorax with small to moderate amount of loculated left pleural fluid. There was improved aeration in the left lung with residual collapse/consolidation in the left upper and left lower lobes. There is a trace of loculated pleural fluid in the minor fissure. The patient was taken back to the OR on 04/21/2020 for repeat VATS, bronchoscopy and thoracotomy with drainage of empyema. The patient's pain is under improved control after adjustments were made to her pain regimen by CTS. Chest tube 1/2 removed on 04/26/2020.   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 Pleural fluid culture positive for Streptococcus pneumoniae-thankfully blood cultures negative. Chest tube in place-CT surgery following. Recommendations  from [facility] are for 2 weeks of IV Rocephin from 12/20-followed by 4 weeks of oral antimicrobial therapy (Cefdinir).   -ID has transitioned the patient from IV ceftriaxine and vancomycin to PO Cefdinir for the 4 weeks remaining in her 6 week course of antibiotics.  - CTx2 in place, but one of them was removed on 04/26/2020.   Pneumothorax: Left Lung - large. Management as per CTS. CT in place- 1 left  COVID-19 infection:The patient initially tested positive at home on 03/27/2020 after having symptoms x 1 day. Has been treated with steroid/Remdesivir. Hypoxia was mostly from empyema-rather than COVID-19 related parenchymal infection. As the patient is clinically  Improved. The patient has been taken out of isolation.  Tricuspid valve regurgitation: The patient has received 2 weeks of IV ceftriaxone and vancomycin. ID has now converted her to PO cefdinir to complete the next 4 weeks of therapy.  Pericardial effusion: Seen on echo on 12/18, but resolved on echo on 12/28.   Depression/anorexia: Continue BuSpar as at home. -was on Effexor in past- defer to outpatient  History of IVDA-heroin/methamphetamine: Noted. -says she does not want to get methadone at home anymore-- wean off all narcotics  Tobacco abuse:Continue transdermal nicotine.  Severe Protein Calorie Malnutrition: Nutrition Status: Nutrition Problem: Severe Malnutrition Etiology: acute illness (COVID 19 infection) Signs/Symptoms: moderate fat depletion,severe muscle depletion,energy intake < or equal to 50% for > or equal to 5 days Interventions: Ensure Enlive (each supplement provides 350kcal and 20 grams of protein),Magic cup    Family Communication/Anticipated D/C date and plan/Code Status   DVT prophylaxis: Lovenox ordered. Code Status: Full Code.  Disposition Plan: Status is:  Inpatient  Remains inpatient appropriate because:Inpatient level of care appropriate due to severity of illness   Dispo: The patient is  from: Home              Anticipated d/c is to: Home              Anticipated d/c date is: 1 day              Patient currently is not medically stable to d/c.         Medical Consultants:    CVTS  ID     Subjective:   Does not want to go back to methadone clinic  Objective:    Vitals:   04/28/20 0406 04/28/20 0729 04/28/20 0954 04/28/20 1129  BP: 106/73 110/80 113/73 100/76  Pulse: 84  84   Resp: 14 18  15   Temp: 98.5 F (36.9 C) 98.5 F (36.9 C)  98.5 F (36.9 C)  TempSrc: Oral Oral  Oral  SpO2: 96%     Weight:      Height:        Intake/Output Summary (Last 24 hours) at 04/28/2020 1243 Last data filed at 04/28/2020 0900 Gross per 24 hour  Intake 240 ml  Output 10 ml  Net 230 ml   Filed Weights   04/01/20 2125 04/02/20 2220  Weight: 52.2 kg 50.2 kg    Exam:  General: Appearance:    Thin female in no acute distress     Lungs:     Chest tube In place  Heart:    Normal heart rate. Normal rhythm. No murmurs, rubs, or gallops.   MS:   All extremities are intact.   Neurologic:   Awake, alert, oriented x 3. No apparent focal neurological           defect.     Data Reviewed:   I have personally reviewed following labs and imaging studies:  Labs: Labs show the following:   Basic Metabolic Panel: Recent Labs  Lab 04/22/20 0625 04/23/20 0102 04/25/20 0114 04/28/20 0058  NA 139 141 138 136  K 4.3 4.2 4.1 4.5  CL 104 106 104 99  CO2 28 27 24 26   GLUCOSE 103* 103* 97 95  BUN 12 13 13 11   CREATININE 0.57 0.55 0.63 0.60  CALCIUM 8.1* 8.3* 8.1* 9.1   GFR Estimated Creatinine Clearance: 80 mL/min (by C-G formula based on SCr of 0.6 mg/dL). Liver Function Tests: Recent Labs  Lab 04/23/20 0102  AST 21  ALT 18  ALKPHOS 53  BILITOT 0.3  PROT 5.5*  ALBUMIN 1.9*   No results for input(s): LIPASE, AMYLASE in the last 168 hours. No results for input(s): AMMONIA in the last 168 hours. Coagulation profile No results for input(s): INR, PROTIME  in the last 168 hours.  CBC: Recent Labs  Lab 04/22/20 0625 04/23/20 0102 04/25/20 0114  WBC 6.6 5.1 5.1  HGB 8.1* 8.7* 9.0*  HCT 24.5* 28.7* 29.3*  MCV 87.5 90.3 89.6  PLT 388 421* 498*   Cardiac Enzymes: No results for input(s): CKTOTAL, CKMB, CKMBINDEX, TROPONINI in the last 168 hours. BNP (last 3 results) No results for input(s): PROBNP in the last 8760 hours. CBG: No results for input(s): GLUCAP in the last 168 hours. D-Dimer: No results for input(s): DDIMER in the last 72 hours. Hgb A1c: No results for input(s): HGBA1C in the last 72 hours. Lipid Profile: No results for input(s): CHOL, HDL, LDLCALC, TRIG, CHOLHDL, LDLDIRECT in the last  72 hours. Thyroid function studies: No results for input(s): TSH, T4TOTAL, T3FREE, THYROIDAB in the last 72 hours.  Invalid input(s): FREET3 Anemia work up: No results for input(s): VITAMINB12, FOLATE, FERRITIN, TIBC, IRON, RETICCTPCT in the last 72 hours. Sepsis Labs: Recent Labs  Lab 04/22/20 0625 04/23/20 0102 04/25/20 0114  WBC 6.6 5.1 5.1    Microbiology Recent Results (from the past 240 hour(s))  Culture, respiratory     Status: None   Collection Time: 04/21/20  9:15 AM   Specimen: Bronchial Washing, Left; Respiratory  Result Value Ref Range Status   Specimen Description BRONCHIAL ALVEOLAR LAVAGE  Final   Special Requests BRONCHIAL WASHINGS SPEC A  Final   Gram Stain   Final    RARE WBC PRESENT, PREDOMINANTLY MONONUCLEAR NO ORGANISMS SEEN    Culture   Final    NO GROWTH 2 DAYS Performed at [facility], [address], [place], [place] [address]    Report Status 04/23/2020 FINAL  Final  Aerobic/Anaerobic Culture (surgical/deep wound)     Status: None   Collection Time: 04/21/20 10:04 AM   Specimen: Pleural, Left; Body Fluid  Result Value Ref Range Status   Specimen Description TISSUE  Final   Special Requests PLEURAL DEBRIS SPEC B  Final   Gram Stain   Final    MODERATE WBC PRESENT,BOTH PMN AND  MONONUCLEAR NO ORGANISMS SEEN    Culture   Final    No growth aerobically or anaerobically. Performed at [facility], [address] [place], [place] [address]    Report Status 04/26/2020 FINAL  Final    Procedures and diagnostic studies:  DG CHEST PORT 1 VIEW  Result Date: 04/28/2020 CLINICAL DATA:  Chest tube.  Empyema. EXAM: PORTABLE CHEST 1 VIEW COMPARISON:  04/27/2020. FINDINGS: Left chest tube in stable position. Stable left hydropneumothorax. Left bronchial valves in stable position. Persistent dense atelectasis left lung. Again mild infiltrate right mid lung cannot be excluded. Heart size stable. Thoracic spine scoliosis. Surgical staples left chest. IMPRESSION: 1. Left chest tube in stable position. Stable left hydropneumothorax. Left bronchial valves in stable position. Persistent dense atelectasis left lung. 2.  Again mild infiltrate right mid lung cannot be excluded. Electronically Signed   By: Maisie Fus  Register   On: 04/28/2020 07:11   DG CHEST PORT 1 VIEW  Result Date: 04/27/2020 CLINICAL DATA:  Chest tube.  Pneumothorax. EXAM: PORTABLE CHEST 1 VIEW COMPARISON:  04/26/2020. FINDINGS: Left chest tube in stable position. Left hydropneumothorax stable. Left bronchial valves again noted. Stable prominent atelectasis left lung again noted. New mild infiltrate right mid lung cannot be excluded. No pleural effusion or pneumothorax. Heart size stable. Thoracic spine scoliosis. Surgical staples left chest. IMPRESSION: 1. Left chest tube in stable position. Left hydropneumothorax stable. Stable prominent atelectasis left lung again noted. Left bronchial valves again noted. 2. New mild infiltrate right mid lung cannot be excluded. Electronically Signed   By: Maisie Fus  Register   On: 04/27/2020 06:41    Medications:   . acidophilus  2 capsule Oral Daily  . busPIRone  10 mg Oral TID  . cefdinir  600 mg Oral Daily  . dextromethorphan-guaiFENesin  1 tablet Oral BID  . enoxaparin  (LOVENOX) injection  40 mg Subcutaneous Q24H  . feeding supplement  237 mL Oral TID BM  . ketorolac  15 mg Intravenous Q6H  . lidocaine  1 patch Transdermal Q24H  . metoprolol tartrate  12.5 mg Oral BID  . multivitamin with minerals  1 tablet Oral Daily  . nicotine  7 mg Transdermal Daily  . pantoprazole  40 mg Oral Daily  . senna-docusate  2 tablet Oral BID  . traZODone  100 mg Oral QHS  . venlafaxine  25 mg Oral BID WC   Continuous Infusions: . 0.9 % NaCl with KCl 20 mEq / L 10 mL/hr at 04/22/20 1024     LOS: 26 days   Joseph Art  [facility]   How to contact the [facility] Attending or Consulting provider 7A - 7P or covering provider during after hours 7P -7A, for this patient?  1. Check the care team in [facility] and look for a) attending/consulting [facility] provider listed and b) the [facility] team listed 2. Log into [email or web address] and use [place]'s universal password to access. If you do not have the password, please contact the hospital operator. 3. Locate the [facility] provider you are looking for under [facility] and page to a number that you can be directly reached. 4. If you still have difficulty reaching the provider, please page the [facility] (Director on Call) for the Hospitalists listed on amion for assistance.  04/28/2020, 12:43 PM

## 2020-04-28 NOTE — Progress Notes (Addendum)
      [address]       [place] [address]             [phone]       7 Days Post-Op Procedure(s) (LRB): VIDEO BRONCHOSCOPY WITH BRONCHIAL WASHINGS (Bilateral) VIDEO ASSISTED THORACOSCOPY (VATS)/ MINI THORACOTOMY, DECORTICATION, DRAINAGE OF EMPYEMA, RESECTION OF NECROTIC PORTION OF LEFT LOWER LOBE (Left) INSERTION OF INTERBRONCHIAL VALVE (IBV) ANTERIOR SEGMENT #19 (Left) CHEST TUBE INSERTION x 2 USING 28 BARD AND 28 STRAIGHT TO LEFT PLEURA (Left) INTERCOSTAL NERVE BLOCK (Left)  Subjective: Patient has pain and burning under left breast. She states she had a terrible day yesterday because of pain.  Objective: Vital signs in last 24 hours: Temp:  [98.1 F (36.7 C)-98.5 F (36.9 C)] 98.5 F (36.9 C) (01/12 0406) Pulse Rate:  [83-99] 84 (01/12 0406) Cardiac Rhythm: Normal sinus rhythm (01/12 0306) Resp:  [14-21] 14 (01/12 0406) BP: (100-123)/(71-88) 106/73 (01/12 0406) SpO2:  [94 %-99 %] 96 % (01/12 0406)      Intake/Output from previous day: 01/11 0701 - 01/12 0700 In: 240 [P.O.:240] Out: 0    Physical Exam:  Cardiovascular: RRR Pulmonary: Clear to auscultation on the right and diminished left lateral breath wounds Abdomen: Soft, non tender, bowel sounds present. Extremities: No lower extremity edema. Wounds: Wounds are all, clean and dry, no sign of infection. Chest Tube: to water seal, air leak with cough  Lab Results: CBC: No results for input(s): WBC, HGB, HCT, PLT in the last 72 hours. BMET:  Recent Labs    04/28/20 0058  NA 136  K 4.5  CL 99  CO2 26  GLUCOSE 95  BUN 11  CREATININE 0.60  CALCIUM 9.1    PT/INR: No results for input(s): LABPROT, INR in the last 72 hours. ABG:  INR: Will add last result for INR, ABG once components are confirmed Will add last 4 CBG results once components are confirmed  Assessment/Plan:  1. CV - SR this am. On Lopressor 12.5 mg bid. 2.  Pulmonary - On room air. No chest tube output  recorded last 24 hours.  Chest tube is to water seal. There is an air leak with cough.  CXR appears relatively stable. Hope to place chest tube to mini express soon. Encourage incentive spirometer. 3. Anemia-H and H yesterday increased to 9 and 29.3 4. ID-Transitioned  to oral Cefdinir for 4 weeks for a total of 6 weeks of antibiotic therapy. Gram stain from pleural debris showed no organisms. Culture shows no growth aerobically or anaerobically. 5. With history of previous drug/narcotic abuse, weaning off IV pain medication. She has Lidocaine patch, Flexeril, and Oxy PRN. Will schedule Toradol for several more doses. Patient previously on Methadone through [facility] in [place] and patient would like to be set up there again;per primary  Lelon Huh [facility] 04/28/2020,7:11 AM [phone]  Will try mini express today I have seen and examined Alyssa Vargas and agree with the above assessment  and plan.  Delight Ovens MD [facility] [phone] Office [phone] 04/28/2020 1:07 PM

## 2020-04-29 ENCOUNTER — Inpatient Hospital Stay (HOSPITAL_COMMUNITY): Payer: Medicaid Other

## 2020-04-29 ENCOUNTER — Encounter: Payer: Medicaid - Out of State | Admitting: Cardiothoracic Surgery

## 2020-04-29 MED ORDER — GABAPENTIN 300 MG PO CAPS
300.0000 mg | ORAL_CAPSULE | Freq: Two times a day (BID) | ORAL | Status: DC
  Administered 2020-04-29 – 2020-04-30 (×3): 300 mg via ORAL
  Filled 2020-04-29 (×3): qty 1

## 2020-04-29 MED ORDER — HYDROMORPHONE HCL 1 MG/ML IJ SOLN: 0.5000 mg | Freq: Every evening | INTRAMUSCULAR | Status: DC | PRN

## 2020-04-29 MED ORDER — KETOROLAC TROMETHAMINE 15 MG/ML IJ SOLN
15.0000 mg | Freq: Four times a day (QID) | INTRAMUSCULAR | Status: DC
  Administered 2020-04-29 – 2020-04-30 (×4): 15 mg via INTRAVENOUS
  Filled 2020-04-29 (×4): qty 1

## 2020-04-29 MED ORDER — BUPRENORPHINE HCL-NALOXONE HCL 2-0.5 MG SL SUBL
1.0000 | SUBLINGUAL_TABLET | SUBLINGUAL | Status: DC | PRN
Start: 2020-04-29 — End: 2020-04-30
  Administered 2020-04-29: 1 via SUBLINGUAL
  Filled 2020-04-29: qty 1

## 2020-04-29 MED ORDER — BUPRENORPHINE HCL-NALOXONE HCL 8-2 MG SL SUBL: 1.0000 | SUBLINGUAL_TABLET | Freq: Two times a day (BID) | SUBLINGUAL | Status: DC

## 2020-04-29 NOTE — Progress Notes (Signed)
Progress Note    Alyssa PieriniHeather Langston  ZOX:[id number] DOB: May 17, 1985  DOA: 04/01/2020 PCP: Patient, No Pcp Per    Brief Narrative:     Medical records reviewed and are as summarized below:  Alyssa Vargas is an 35 y.o. female with medical history significant for tobacco abuse who presents to the emergency department due to 1 week of worsening shortness of breath associated with cough, loss of sense of taste with decreased appetite and generalized body aches.  ID has transitioned the patient from IV ceftriaxine and vancomycin to PO Cefdinir for the 4 weeks remaining in her 6 week course of antibiotics. Repeat CT chest was performed on 04/20/2019. It has demonstrated a large left pneumothorax with small to moderate amount of loculated left pleural fluid. There was improved aeration in the left lung with residual collapse/consolidation in the left upper and left lower lobes. There is a trace of loculated pleural fluid in the minor fissure. The patient was taken back to the OR on 04/21/2020 for repeat VATS, bronchoscopy and thoracotomy with drainage of empyema. The patient's pain is under improved control after adjustments were made to her pain regimen by CTS. Chest tube 1/2 removed on 04/26/2020.    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 Pleural fluid culture positive for Streptococcus pneumoniae-thankfully blood cultures negative. Chest tube in place-CT surgery following.  Recommendations from [facility] are for 2 weeks of IV Rocephin from 12/20-followed by 4 weeks of oral antimicrobial therapy (Cefdinir).   -ID has transitioned the patient from IV ceftriaxine and vancomycin to PO Cefdinir for the 4 weeks remaining in her 6 week course of antibiotics.  - CTx2 in place, but one of them was removed on 04/26/2020.   Pneumothorax: Left Lung - large. Management as per CTS. CT in place- 1 left  COVID-19 infection:The patient initially tested positive at home on 03/27/2020 after having symptoms x 1 day. Has been treated with steroid/Remdesivir. Hypoxia was mostly from empyema-rather than COVID-19 related parenchymal infection. As the patient is clinically  Improved. The patient has been taken out of isolation.  Tricuspid valve regurgitation: The patient has received 2 weeks of IV ceftriaxone and vancomycin. ID has now converted her to PO cefdinir to complete the next 4 weeks of therapy.  Pericardial effusion: Seen on echo on 12/18, but resolved on echo on 12/28.   Depression/anorexia: Continue BuSpar as at home. -was on Effexor in past- defer to outpatient  History of IVDA-heroin/methamphetamine: Noted. -plan to change to suboxone and d/c to follow up in suboxone clinic on Tuesday AM -NO narcotics  Tobacco abuse:Continue transdermal nicotine.  Severe Protein Calorie Malnutrition: Nutrition Status: Nutrition Problem: Severe Malnutrition Etiology: acute illness (COVID 19 infection) Signs/Symptoms: moderate fat depletion,severe muscle depletion,energy intake < or equal to 50% for > or equal to 5 days Interventions: Ensure Enlive (each supplement provides 350kcal and 20 grams of protein),Magic cup    Family Communication/Anticipated D/C date and plan/Code Status   DVT prophylaxis: Lovenox ordered. Code Status: Full Code.  Disposition Plan: Status is: Inpatient  Remains inpatient appropriate because:Inpatient level of care appropriate due to severity of  illness   Dispo: The patient is from: Home              Anticipated d/c is to: Home              Anticipated d/c date is: 1 day              Patient currently is not medically stable to d/c.- needs plan for suboxone outpatient         Medical Consultants:    CVTS  ID     Subjective:  Willing to do suboxone  Objective:    Vitals:   04/28/20 2007 04/28/20 2329 04/29/20 0300 04/29/20 0803  BP: 113/69 111/84 105/67 106/72  Pulse: (!) 103 88 87 97  Resp: 20 20 18 19   Temp: 98.4 F (36.9 C) 98.3 F (36.8 C) 97.7 F (36.5 C) 98.2 F (36.8 C)  TempSrc: Oral Oral Oral Oral  SpO2: 92% 98% 97%   Weight:      Height:        Intake/Output Summary (Last 24 hours) at 04/29/2020 1113 Last data filed at 04/29/2020 0400 Gross per 24 hour  Intake --  Output 30 ml  Net -30 ml   Filed Weights   04/01/20 2125 04/02/20 2220  Weight: 52.2 kg 50.2 kg    Exam:  General: Appearance:    Thin female in no acute distress     Lungs:     respirations unlabored  Heart:    Normal heart rate. Normal rhythm. No murmurs, rubs, or gallops.   MS:   All extremities are intact.   Neurologic:   Awake, alert, oriented x 3. No apparent focal neurological           defect.      Data Reviewed:   I have personally reviewed following labs and imaging studies:  Labs: Labs show the following:   Basic Metabolic Panel: Recent Labs  Lab 04/23/20 0102 04/25/20 0114 04/28/20 0058  NA 141 138 136  K 4.2 4.1 4.5  CL 106 104 99  CO2 27 24 26   GLUCOSE 103* 97 95  BUN 13 13 11   CREATININE 0.55 0.63 0.60  CALCIUM 8.3* 8.1* 9.1   GFR Estimated Creatinine Clearance: 80 mL/min (by C-G formula based on SCr of 0.6 mg/dL). Liver Function Tests: Recent Labs  Lab 04/23/20 0102  AST 21  ALT 18  ALKPHOS 53  BILITOT 0.3  PROT 5.5*  ALBUMIN 1.9*   No results for input(s): LIPASE, AMYLASE in the last 168 hours. No results for input(s): AMMONIA in the last 168 hours. Coagulation  profile No results for input(s): INR, PROTIME in the last 168 hours.  CBC: Recent Labs  Lab 04/23/20 0102 04/25/20 0114  WBC 5.1 5.1  HGB 8.7* 9.0*  HCT 28.7* 29.3*  MCV 90.3 89.6  PLT 421* 498*   Cardiac Enzymes: No results for input(s): CKTOTAL, CKMB, CKMBINDEX, TROPONINI in the last 168 hours. BNP (last 3 results) No results for input(s): PROBNP in the last 8760 hours. CBG: No results for input(s): GLUCAP in the last 168 hours. D-Dimer: No results for input(s): DDIMER in the last 72 hours. Hgb A1c: No results for input(s): HGBA1C in the last 72 hours. Lipid Profile: No results for input(s): CHOL, HDL, LDLCALC, TRIG, CHOLHDL, LDLDIRECT in the last 72 hours. Thyroid function studies: No results for input(s): TSH, T4TOTAL, T3FREE,  THYROIDAB in the last 72 hours.  Invalid input(s): FREET3 Anemia work up: No results for input(s): VITAMINB12, FOLATE, FERRITIN, TIBC, IRON, RETICCTPCT in the last 72 hours. Sepsis Labs: Recent Labs  Lab 04/23/20 0102 04/25/20 0114  WBC 5.1 5.1    Microbiology Recent Results (from the past 240 hour(s))  Culture, respiratory     Status: None   Collection Time: 04/21/20  9:15 AM   Specimen: Bronchial Washing, Left; Respiratory  Result Value Ref Range Status   Specimen Description BRONCHIAL ALVEOLAR LAVAGE  Final   Special Requests BRONCHIAL WASHINGS SPEC A  Final   Gram Stain   Final    RARE WBC PRESENT, PREDOMINANTLY MONONUCLEAR NO ORGANISMS SEEN    Culture   Final    NO GROWTH 2 DAYS Performed at [facility], [address] [place], [place] [address]    Report Status 04/23/2020 FINAL  Final  Aerobic/Anaerobic Culture (surgical/deep wound)     Status: None   Collection Time: 04/21/20 10:04 AM   Specimen: Pleural, Left; Body Fluid  Result Value Ref Range Status   Specimen Description TISSUE  Final   Special Requests PLEURAL DEBRIS SPEC B  Final   Gram Stain   Final    MODERATE WBC PRESENT,BOTH PMN AND MONONUCLEAR NO  ORGANISMS SEEN    Culture   Final    No growth aerobically or anaerobically. Performed at [facility], [address] [place], [place] [address]    Report Status 04/26/2020 FINAL  Final    Procedures and diagnostic studies:  DG CHEST PORT 1 VIEW  Result Date: 04/29/2020 CLINICAL DATA:  Chest tube.  Empyema. EXAM: PORTABLE CHEST 1 VIEW COMPARISON:  04/28/2020.  04/27/2020.  04/24/2020. FINDINGS: Left chest tube in stable position. Stable left hydropneumothorax. Left bronchial valves in stable position. Persistent left lung atelectasis. Again mild infiltrate right mid lung cannot be excluded. Stable mild right apical pleural thickening. No interim change. Heart size stable. IMPRESSION: 1. Left chest tube in stable position. Stable left hydropneumothorax. Left bronchial valve in stable position. Persistent left lung atelectasis. 2. Again mild infiltrate right mid lung cannot be excluded. Electronically Signed   By: Maisie Fus  Register   On: 04/29/2020 06:51   DG CHEST PORT 1 VIEW  Result Date: 04/28/2020 CLINICAL DATA:  Chest tube.  Empyema. EXAM: PORTABLE CHEST 1 VIEW COMPARISON:  04/27/2020. FINDINGS: Left chest tube in stable position. Stable left hydropneumothorax. Left bronchial valves in stable position. Persistent dense atelectasis left lung. Again mild infiltrate right mid lung cannot be excluded. Heart size stable. Thoracic spine scoliosis. Surgical staples left chest. IMPRESSION: 1. Left chest tube in stable position. Stable left hydropneumothorax. Left bronchial valves in stable position. Persistent dense atelectasis left lung. 2.  Again mild infiltrate right mid lung cannot be excluded. Electronically Signed   By: Maisie Fus  Register   On: 04/28/2020 07:11    Medications:   . acidophilus  2 capsule Oral Daily  . busPIRone  10 mg Oral TID  . cefdinir  600 mg Oral Daily  . dextromethorphan-guaiFENesin  1 tablet Oral BID  . enoxaparin (LOVENOX) injection  40 mg Subcutaneous Q24H  .  feeding supplement  237 mL Oral TID BM  . gabapentin  300 mg Oral BID  . ketorolac  15 mg Intravenous Q6H  . lidocaine  1 patch Transdermal Q24H  . metoprolol tartrate  12.5 mg Oral BID  . multivitamin with minerals  1 tablet Oral Daily  . nicotine  7  mg Transdermal Daily  . pantoprazole  40 mg Oral Daily  . senna-docusate  2 tablet Oral BID  . traZODone  100 mg Oral QHS   Continuous Infusions:    LOS: 27 days   Joseph Art  [facility]   How to contact the [facility] Attending or Consulting provider 7A - 7P or covering provider during after hours 7P -7A, for this patient?  1. Check the care team in [facility] and look for a) attending/consulting TRH provider listed and b) the [facility] team listed 2. Log into [email or web address] and use [facility]'s universal password to access. If you do not have the password, please contact the hospital operator. 3. Locate the [facility] provider you are looking for under [facility] and page to a number that you can be directly reached. 4. If you still have difficulty reaching the provider, please page the [facility] (Director on Call) for the Hospitalists listed on amion for assistance.  04/29/2020, 11:13 AM

## 2020-04-29 NOTE — TOC Progression Note (Addendum)
Transition of Care [facility]) - Progression Note    Patient Details  Name: Irisa Grimsley MRN: [id number] Date of Birth: 1985-05-24  Transition of Care [facility]) CM/SW [place], [place] Phone Number: 04/29/2020, 10:01 AM  Clinical Narrative:     CSW called pt's mother. She confirmed desire for pt to get OP counseling and psychiatry at [facility].   [facility] and is informed pt would need to make profile online and then call back for appointment.   CSW met with pt and completed online profile.   CSW is notified that pt will need methadone tx post-discharge. CSW called [facility] in [place]. They only do intakes on Tuesdays and Wednesday's between 3 and 330am. Pt will need to bring her insurance card and a valid ID.   CSW called [employer] to inquire about MAT; no answer, left voicemail  1408: CSW called [facility] to make OP Psych appointment. Scheduling person is at lunch; they will call CSW back.   1604: CSW called [place] [facility]; Their behavioral health scheduler is still on lunch. They will call CSW back. CSW then scheduled PCP appointment with secretary for 06/29/20 at [place] also discussed with tx team plan to dc pt with enough suboxone tomorrow until Tuesday 05/04/20. Pt's [place] Medicaid does not cover suboxone unless the prescribing physician is associated with [place]. CSW informs pt that her suboxone prescription from the hospital would not be covered by insurance. Pt will need to pay out of pocket. CSW provides pt with Goodrx resources. Pt state she thinks her mom would help her with cost.   Expected Discharge Plan: Home/Self Care Barriers to Discharge: Continued Medical Work up  Expected Discharge Plan and Services Expected Discharge Plan: Home/Self Care   Discharge Planning Services: CM Consult   Living arrangements for the past 2 months: Single Family Home                                        Social Determinants of Health (SDOH) Interventions    Readmission Risk Interventions No flowsheet data found.

## 2020-04-29 NOTE — Progress Notes (Addendum)
      [address]       [place] [address]             [phone]       8 Days Post-Op Procedure(s) (LRB): VIDEO BRONCHOSCOPY WITH BRONCHIAL WASHINGS (Bilateral) VIDEO ASSISTED THORACOSCOPY (VATS)/ MINI THORACOTOMY, DECORTICATION, DRAINAGE OF EMPYEMA, RESECTION OF NECROTIC PORTION OF LEFT LOWER LOBE (Left) INSERTION OF INTERBRONCHIAL VALVE (IBV) ANTERIOR SEGMENT #19 (Left) CHEST TUBE INSERTION x 2 USING 28 BARD AND 28 STRAIGHT TO LEFT PLEURA (Left) INTERCOSTAL NERVE BLOCK (Left)  Subjective: Patient has continued pain and burning under left breast. She states she cried most of yesterday because of pain and that  Dilaudid was stopped.  Objective: Vital signs in last 24 hours: Temp:  [97.7 F (36.5 C)-98.5 F (36.9 C)] 97.7 F (36.5 C) (01/13 0300) Pulse Rate:  [84-103] 87 (01/13 0300) Cardiac Rhythm: Normal sinus rhythm (01/13 0400) Resp:  [15-20] 18 (01/13 0300) BP: (100-115)/(67-84) 105/67 (01/13 0300) SpO2:  [92 %-98 %] 97 % (01/13 0300)      Intake/Output from previous day: 01/12 0701 - 01/13 0700 In: -  Out: 40 [Chest Tube:40]   Physical Exam:  Cardiovascular: RRR Pulmonary: Clear to auscultation on the right and diminished left lateral breath wounds Abdomen: Soft, non tender, bowel sounds present. Extremities: No lower extremity edema. Wounds: Wounds are all, clean and dry, no sign of infection. Chest Tube: to mini express, air leak   Lab Results: CBC: No results for input(s): WBC, HGB, HCT, PLT in the last 72 hours. BMET:  Recent Labs    04/28/20 0058  NA 136  K 4.5  CL 99  CO2 26  GLUCOSE 95  BUN 11  CREATININE 0.60  CALCIUM 9.1    PT/INR: No results for input(s): LABPROT, INR in the last 72 hours. ABG:  INR: Will add last result for INR, ABG once components are confirmed Will add last 4 CBG results once components are confirmed  Assessment/Plan:  1. CV - SR this am. On Lopressor 12.5 mg bid. 2.  Pulmonary - On  room air. Chest tube output 40 cc last 24 hours.  Chest tube placed to mini express yesterday. There is an air leak with cough.  CXR appears relatively stable (stable left hydropneumothorax and atelectasis. Encourage incentive spirometer. 3. Anemia-Last H and H 9 and 29.3 4. ID-Transitioned  to oral Cefdinir for 4 weeks for a total of 6 weeks of antibiotic therapy. Gram stain from pleural debris showed no organisms. Culture shows no growth aerobically or anaerobically. 5. With history of previous drug/narcotic abuse, weaned off Dilaudid.  She has Lidocaine patch, Flexeril, Toradol, and Oxy PRN.  Patient previously on Methadone through [facility] in [place] and patient would like to be set up there again;per primary 6. I have arranged follow up with CXR to see my next week.  Alyssa M ZimmermanPA-C 04/29/2020,7:01 AM [phone]  Will not be ready for  D/c until plan for pain meds established and  narcotic/drug treatment started- Surgery office will not be offering narcotic meds  Will try Neurontin- for chest wall discomfort   Chest tube stable  I have seen and examined Alyssa Vargas and agree with the above assessment  and plan.  Alyssa Ovens MD [facility] [phone] Office [phone] 04/29/2020 9:11 AM

## 2020-04-30 ENCOUNTER — Inpatient Hospital Stay (HOSPITAL_COMMUNITY): Payer: Medicaid Other

## 2020-04-30 ENCOUNTER — Other Ambulatory Visit (HOSPITAL_COMMUNITY): Payer: Self-pay | Admitting: Internal Medicine

## 2020-04-30 MED ORDER — DM-GUAIFENESIN ER 30-600 MG PO TB12: 1.0000 | ORAL_TABLET | Freq: Two times a day (BID) | ORAL | 0 refills | Status: AC

## 2020-04-30 MED ORDER — CYCLOBENZAPRINE HCL 5 MG PO TABS: 5.0000 mg | ORAL_TABLET | Freq: Three times a day (TID) | ORAL | 0 refills | Status: AC | PRN

## 2020-04-30 MED ORDER — RISAQUAD PO CAPS: 2.0000 | ORAL_CAPSULE | Freq: Every day | ORAL | 0 refills | Status: AC

## 2020-04-30 MED ORDER — BUSPIRONE HCL 10 MG PO TABS: 10.0000 mg | ORAL_TABLET | Freq: Three times a day (TID) | ORAL | 0 refills | Status: AC

## 2020-04-30 MED ORDER — ALBUTEROL SULFATE HFA 108 (90 BASE) MCG/ACT IN AERS: 2.0000 | INHALATION_SPRAY | RESPIRATORY_TRACT | 0 refills | Status: AC | PRN

## 2020-04-30 MED ORDER — GABAPENTIN 300 MG PO CAPS: 300.0000 mg | ORAL_CAPSULE | Freq: Two times a day (BID) | ORAL | 0 refills | Status: AC

## 2020-04-30 MED ORDER — NICOTINE 7 MG/24HR TD PT24: 7.0000 mg | MEDICATED_PATCH | Freq: Every day | TRANSDERMAL | 0 refills | Status: AC

## 2020-04-30 MED ORDER — BUPRENORPHINE HCL-NALOXONE HCL 8-2 MG SL SUBL: 1.0000 | SUBLINGUAL_TABLET | Freq: Two times a day (BID) | SUBLINGUAL | 0 refills | Status: AC

## 2020-04-30 MED ORDER — CEFDINIR 300 MG PO CAPS: 600.0000 mg | ORAL_CAPSULE | Freq: Every day | ORAL | 0 refills | Status: DC

## 2020-04-30 MED ORDER — METOPROLOL TARTRATE 25 MG PO TABS: 12.5000 mg | ORAL_TABLET | Freq: Two times a day (BID) | ORAL | 0 refills | Status: AC

## 2020-04-30 MED ORDER — TRAZODONE HCL 100 MG PO TABS: 100.0000 mg | ORAL_TABLET | Freq: Every day | ORAL | 0 refills | Status: AC

## 2020-04-30 MED ORDER — LIDOCAINE 5 % EX PTCH
1.0000 | MEDICATED_PATCH | CUTANEOUS | 0 refills | Status: AC
Start: 2020-05-01 — End: ?

## 2020-04-30 MED FILL — BUPREN-NALOX SL TAB 8-2MG: 8-2 | 4 days supply | Qty: 8 | Fill #0

## 2020-04-30 NOTE — Discharge Instructions (Signed)
Thoracoscopy, Care After This sheet gives you information about how to care for yourself after your procedure. Your health care provider may also give you more specific instructions. If you have problems or questions, contact your health care provider. What can I expect after the procedure? After the procedure, it is common to have pain and soreness in the surgical area. Follow these instructions at home: Incision care   Follow instructions from your health care provider about how to take care of your incision. Make sure you: ? Wash your hands with soap and water before you change your bandage (dressing). If soap and water are not available, use hand sanitizer. ? Change your dressing as told by your health care provider. ? Leave stitches (sutures), skin glue, or adhesive strips in place. These skin closures may need to stay in place for 2 weeks or longer. If adhesive strip edges start to loosen and curl up, you may trim the loose edges. Do not remove adhesive strips completely unless your health care provider tells you to do that.  Check your incision areas every day for signs of infection. Check for: ? Redness, swelling, or pain. ? Fluid or blood. ? Warmth. ? Pus or a bad smell.  Do not take baths, swim, or use a hot tub until your health care provider approves. You may take showers. Medicines  Take over-the-counter and prescription medicines only as told by your health care provider.  If you were prescribed an antibiotic medicine, take it as told by your health care provider. Do not stop taking the antibiotic even if you start to feel better.  Do not drive or use heavy machinery while taking prescription pain medicine.  If you are taking prescription pain medicine, take actions to prevent or treat constipation. Your health care provider may recommend that you: ? Drink enough fluid to keep your urine pale yellow. ? Eat foods that are high in fiber, such as fresh fruits and vegetables,  whole grains, and beans. ? Limit foods that are high in fat and processed sugars, such as fried and sweet foods. ? Take an over-the-counter or prescription medicine for constipation. Managing pain, stiffness, and swelling   If directed, put ice on the affected area: ? Put ice in a plastic bag. ? Place a towel between your skin and the bag. ? Leave the ice on for 20 minutes, 2-3 times a day. Preventing lung infection  To prevent pneumonia and to keep your lungs healthy: ? Try to cough often. If it hurts to cough, hold a pillow against your chest as you cough. ? Take deep breaths or do breathing exercises as instructed by your health care provider. ? If you were given an incentive spirometer, use it as directed by your health care provider. General instructions  Do not lift anything that is heavier than 10 lb (4.5 kg), or the limit that you are told, until your health care provider says that it is safe.  Do not use any products that contain nicotine or tobacco, such as cigarettes and e-cigarettes. These can delay healing after surgery. If you need help quitting, ask your health care provider.  Avoid driving until your health care provider approves.  If you have a chest drainage tube, care for it as instructed by your health care provider. Do not travel by airplane after the chest drainage tube is removed until your health care provider approves.  Keep all follow-up visits as told by your health care provider. This is important. Contact   a health care provider if:  You have a fever.  Pain medicines do not ease your pain.  You have redness, swelling, or increasing pain in your incision area.  You develop a cough that does not go away, or you are coughing up mucus that is yellow or green. Get help right away if:  You have fluid, blood, or pus coming from your incision.  There is a bad smell coming from your incision or dressing.  You develop a rash.  You cough up blood.  You  develop light-headedness, or you feel faint.  You have difficulty breathing.  You develop chest pain.  Your heartbeat feels irregular or very fast. These symptoms may represent a serious problem that is an emergency. Do not wait to see if the symptoms will go away. Get medical help right away. Call your local emergency services (911 in the [place] Do not drive yourself to the hospital. Summary  Follow instructions from your health care provider about how to take care of your incision.  Do not drive or use heavy machinery while taking prescription pain medicine.  Leave stitches (sutures), skin glue, or adhesive strips in place.  Check your incision areas every day for signs of infection. This information is not intended to replace advice given to you by your health care provider. Make sure you discuss any questions you have with your health care provider. Document Revised: 03/16/2017 Document Reviewed: 03/13/2017 Elsevier Patient Education  2020 Elsevier Inc.  

## 2020-04-30 NOTE — Progress Notes (Signed)
   04/30/20 1200  Assess: MEWS Score  Temp 98.4 F (36.9 C)  BP 118/72  Pulse Rate (!) 127  ECG Heart Rate (!) 127  Resp (!) 25  Assess: MEWS Score  MEWS Temp 0  MEWS Systolic 0  MEWS Pulse 2  MEWS RR 1  MEWS LOC 0  MEWS Score 3  MEWS Score Color Yellow  Assess: if the MEWS score is Yellow or Red  Were vital signs taken at a resting state? Yes  Focused Assessment No change from prior assessment  Early Detection of Sepsis Score *See Row Information* Low  MEWS guidelines implemented *See Row Information* No, previously yellow, continue vital signs every 4 hours (patient anxoius to be discharged!!!)

## 2020-04-30 NOTE — Progress Notes (Signed)
CSW notified pt of her appointments with [facility]. CSW updated pt that suboxone would be filled at [facility] clinic with cost of 12.90. Pt is okay with this. She will follow up for suboxone provider at [facility] for walk in intake on Tuesday 05/04/20 at 3am

## 2020-04-30 NOTE — Plan of Care (Signed)
  Problem: Education: Goal: Knowledge of General Education information will improve Description: Including pain rating scale, medication(s)/side effects and non-pharmacologic comfort measures Outcome: Completed/Met   Problem: Health Behavior/Discharge Planning: Goal: Ability to manage health-related needs will improve Outcome: Completed/Met   Problem: Clinical Measurements: Goal: Ability to maintain clinical measurements within normal limits will improve Outcome: Completed/Met Goal: Will remain free from infection Outcome: Completed/Met Goal: Diagnostic test results will improve Outcome: Completed/Met Goal: Cardiovascular complication will be avoided Outcome: Completed/Met   Problem: Activity: Goal: Risk for activity intolerance will decrease Outcome: Completed/Met   Problem: Coping: Goal: Level of anxiety will decrease Outcome: Completed/Met   Problem: Pain Managment: Goal: General experience of comfort will improve Outcome: Completed/Met   Problem: Safety: Goal: Ability to remain free from injury will improve Outcome: Completed/Met   Problem: Increased Nutrient Needs (NI-5.1) Goal: Food and/or nutrient delivery Description: Individualized approach for food/nutrient provision. Outcome: Completed/Met   Problem: Education: Goal: Knowledge of risk factors and measures for prevention of condition will improve Outcome: Completed/Met   Problem: Coping: Goal: Psychosocial and spiritual needs will be supported Outcome: Completed/Met   Problem: Respiratory: Goal: Will maintain a patent airway Outcome: Completed/Met Goal: Complications related to the disease process, condition or treatment will be avoided or minimized Outcome: Completed/Met   Problem: Activity: Goal: Risk for activity intolerance will decrease Outcome: Completed/Met   Problem: Clinical Measurements: Goal: Postoperative complications will be avoided or minimized Outcome: Completed/Met   Problem:  Respiratory: Goal: Respiratory status will improve Outcome: Completed/Met   Problem: Pain Management: Goal: Pain level will decrease Outcome: Completed/Met   Problem: Skin Integrity: Goal: Wound healing without signs and symptoms infection will improve Outcome: Completed/Met

## 2020-04-30 NOTE — Discharge Summary (Signed)
Physician Discharge Summary  Alyssa Vargas KPT:[id number] DOB: 1985-08-31 DOA: 04/01/2020  PCP: Patient, No Pcp Per  Admit date: 04/01/2020 Discharge date: 04/30/2020  Admitted From: home Discharge disposition: home   Recommendations for Outpatient Follow-Up:   1. Close CVTS follow up 2. 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  Diet recommendation:   Regular.  Wound care: None.  Code status: Full.   History of Present Illness:   Alyssa Vargas is a 35 y.o. female with medical history significant for tobacco abuse who presents to the emergency department due to 1 week of worsening shortness of breath associated with cough, loss of sense of taste with decreased appetite and generalized body aches. Patient states that she was exposed to someone with Covid, she complained of chills and sweats, but was unsure if she had fever. She states that she has lost about 18 pounds within 1 month and states that this was due to being depressed prior to onset of current symptoms. Last smoking of cigarettes was yesterday. She denies any thoughts to harm herself or anyone.   Hospital Course by Problem:   Acute hypoxic respiratory failure due to large left-sided empyema:Hypoxia has resolved-s/p VATS procedure on 12/20. Pleural fluid culture positive for Streptococcus pneumoniae-thankfully blood cultures negative. Chest tube in place-CT surgery following. Recommendations from [facility] are for 2 weeks of IV Rocephin from  12/20-followed by 4 weeks of oral antimicrobial therapy (Cefdinir).  -ID has transitioned the patient from IV ceftriaxine and vancomycin to PO Cefdinir for the 4 weeks remaining in her 6 week course of antibiotics.  - CTx2in place, but one of them was removed on 04/26/2020.  Pneumothorax: Left Lung - large. Management as per CTS. CT in place- 1 left  COVID-19 infection:The patient initially tested positive at home on 03/27/2020 after having symptoms x 1 day. Has been treated with steroid/Remdesivir. Hypoxia was mostly from empyema-rather than COVID-19 related parenchymal infection. As the patient is clinically Improved. The patient has been taken out of isolation.  Tricuspid valve regurgitation: The patient has received 2 weeks of IV ceftriaxone and vancomycin. ID has now converted her to PO cefdinir to complete the next 4 weeks of therapy.  Pericardial effusion:Seen on echo on 12/18, but resolved on echo on 12/28.   Depression/anorexia:Continue BuSpar as at home. -was on Effexor in past- defer to outpatient  History of IVDA-heroin/methamphetamine: Noted. -change to suboxone and d/c to follow up in suboxone clinic on Tuesday AM -NO narcotics  Tobacco abuse:Continue transdermal nicotine.  Severe Protein Calorie Malnutrition: Nutrition Status: Nutrition Problem: Severe Malnutrition Etiology: acute illness (COVID 19 infection) Signs/Symptoms: moderate fat depletion,severe muscle depletion,energy intake < or equal to 50% for > or equal to 5 days Interventions: Ensure Enlive (each supplement provides 350kcal and 20 grams of protein),Magic cup    Medical Consultants:   cvts ID   Discharge Exam:   Vitals:   04/30/20 0443 04/30/20 0746  BP: 112/69 126/69  Pulse: 88 99  Resp: 16 20  Temp: 98.4 F (36.9 C) 98.2  F (36.8 C)  SpO2: 99%    Vitals:   04/29/20 2100 04/30/20 0013 04/30/20 0443 04/30/20 0746  BP: 113/75 109/78 112/69 126/69  Pulse: 87 99 88 99   Resp: 18 19 16 20   Temp: 98.5 F (36.9 C) 98.1 F (36.7 C) 98.4 F (36.9 C) 98.2 F (36.8 C)  TempSrc: Oral Oral Oral Oral  SpO2: 96% 98% 99%   Weight:      Height:        General exam: Appears calm and comfortable.   The results of significant diagnostics from this hospitalization (including imaging, microbiology, ancillary and laboratory) are listed below for reference.     Procedures and Diagnostic Studies:   CT Angio Chest PE W and/or Wo Contrast  Result Date: 04/02/2020 CLINICAL DATA:  COVID exposure, dyspnea, cough, malaise, positive D-dimer EXAM: CT ANGIOGRAPHY CHEST WITH CONTRAST TECHNIQUE: Multidetector CT imaging of the chest was performed using the standard protocol during bolus administration of intravenous contrast. Multiplanar CT image reconstructions and MIPs were obtained to evaluate the vascular anatomy. CONTRAST:  100mL OMNIPAQUE IOHEXOL 350 MG/ML SOLN COMPARISON:  None. FINDINGS: Cardiovascular: There is adequate opacification of the pulmonary arterial tree. There is no intraluminal filling defect identified to suggest acute pulmonary embolism. The central pulmonary arteries are of normal caliber. There is marked mediastinal shift to the right. Global cardiac size within normal limits. No significant coronary artery calcification. No pericardial effusion. The thoracic aorta is unremarkable. Mediastinum/Nodes: Thyroid unremarkable. Soft tissue within the anterior mediastinum likely represents rebound thymic or residual thymic tissue. The esophagus is unremarkable. No pathologic thoracic adenopathy is identified. Lungs/Pleura: There is a massive left pleural effusion which completely fills the left hemithorax, completely collapses the left lung, and demonstrates marked mass effect upon the mediastinum with marked mediastinal shift to the right. There is interstitial gas within a portion of the left lower lobe likely representing the lateral segment of the left lower lobe  which may represent necrosis of the setting of necrotizing pneumonia. Mild patchy ground-glass infiltrate within the a right upper lobe anteriorly is nonspecific, possibly infectious or inflammatory. A bilobed fluid density structure is seen within the right middle lobe measuring 2.3 x 3.6 x 1.6 cm in greatest dimension, nonspecific, but possibly the sequela of prior infection or trauma. Upper Abdomen: No acute abnormality. Musculoskeletal: No acute bone abnormality. Review of the MIP images confirms the above findings. IMPRESSION: No pulmonary embolism. Massive left pleural effusion, possibly representing a a parapneumonic effusion or empyema, demonstrating marked mass effect upon the mediastinum with marked left right mediastinal shift. Extensive interstitial gas within the probable lateral segment of the right lower lobe suggesting parenchymal necrosis in the setting of necrotizing pneumonia. Bilobed fluid-filled structure within the right middle lobe measuring 3.6 cm possibly representing the sequela of remote trauma or inflammation. Minimal patchy infiltrate within the right upper lobe, likely infectious or inflammatory. Electronically Signed   By: Helyn NumbersAshesh  Parikh MD   On: 04/02/2020 04:18   DG Chest Portable 1 View  Result Date: 04/02/2020 CLINICAL DATA:  Large LEFT pleural effusion EXAM: PORTABLE CHEST 1 VIEW COMPARISON:  Portable exam at 1203 compared to 0227 hrs FINDINGS: Resolution of previously identified LEFT to RIGHT mediastinal shift. Unable to assess heart size due to subtotal opacification of the LEFT hemithorax. Minimal aeration now seen in LEFT lung. No pneumothorax. Nodular foci at inferior RIGHT lung again seen with minimal RIGHT basilar atelectasis. Osseous structures unremarkable. IMPRESSION: No pneumothorax following LEFT thoracentesis. Resolution of LEFT to  RIGHT mediastinal shift. Persistent nodular foci at inferior RIGHT hemithorax. Electronically Signed   By: Ulyses Southward M.D.   On:  04/02/2020 12:32   DG Chest Port 1 View  Result Date: 04/02/2020 CLINICAL DATA:  COVID, shortness of breath EXAM: PORTABLE CHEST 1 VIEW COMPARISON:  None. FINDINGS: Complete whiteout of the left hemithorax, likely related to a combination of effusion and airspace disease. Heart and mediastinal structures are shifted to the right. Right mid lung patchy airspace disease. No effusion on the right. No acute bony abnormality. IMPRESSION: IMPRESSION Complete opacification of the left hemithorax, likely related to effusion and airspace disease. Patchy right mid lung airspace disease. Electronically Signed   By: Charlett Nose M.D.   On: 04/02/2020 02:48   US THORACENTESIS ASP PLEURAL SPACE W/IMG GUIDE  Result Date: 04/02/2020 INDICATION: Large LEFT pleural effusion EXAM: ULTRASOUND GUIDED DIAGNSOTIC AND THERAPEUTIC LEFT THORACENTESIS MEDICATIONS: None. COMPLICATIONS: None immediate. PROCEDURE: Procedure, benefits, and risks of procedure were discussed with patient. Written informed consent for procedure was obtained. Time out protocol followed. Pleural effusion localized by ultrasound at the posterior LEFT hemithorax. Fluid is markedly complex containing diffuse internal echogenicity as well as dependent echogenicity. Skin prepped and draped in usual sterile fashion. Skin and soft tissues anesthetized with 10 mL of 1% lidocaine. 8 French thoracentesis catheter placed into the LEFT pleural space. 1.88 L of complex cloudy tan fluid with particulates with purulent appearance aspirated by syringe pump. Findings most likely represent empyema. Procedure tolerated well by patient without immediate complication. FINDINGS: Complex purulent appearing LEFT pleural fluid likely representing empyema. IMPRESSION: Successful ultrasound guided LEFT thoracentesis yielding 1.88 L of pleural fluid. Electronically Signed   By: Ulyses Southward M.D.   On: 04/02/2020 12:39     Labs:   Basic Metabolic Panel: Recent Labs  Lab  04/25/20 0114 04/28/20 0058  NA 138 136  K 4.1 4.5  CL 104 99  CO2 24 26  GLUCOSE 97 95  BUN 13 11  CREATININE 0.63 0.60  CALCIUM 8.1* 9.1   GFR Estimated Creatinine Clearance: 80 mL/min (by C-G formula based on SCr of 0.6 mg/dL). Liver Function Tests: No results for input(s): AST, ALT, ALKPHOS, BILITOT, PROT, ALBUMIN in the last 168 hours. No results for input(s): LIPASE, AMYLASE in the last 168 hours. No results for input(s): AMMONIA in the last 168 hours. Coagulation profile No results for input(s): INR, PROTIME in the last 168 hours.  CBC: Recent Labs  Lab 04/25/20 0114  WBC 5.1  HGB 9.0*  HCT 29.3*  MCV 89.6  PLT 498*   Cardiac Enzymes: No results for input(s): CKTOTAL, CKMB, CKMBINDEX, TROPONINI in the last 168 hours. BNP: Invalid input(s): POCBNP CBG: No results for input(s): GLUCAP in the last 168 hours. D-Dimer No results for input(s): DDIMER in the last 72 hours. Hgb A1c No results for input(s): HGBA1C in the last 72 hours. Lipid Profile No results for input(s): CHOL, HDL, LDLCALC, TRIG, CHOLHDL, LDLDIRECT in the last 72 hours. Thyroid function studies No results for input(s): TSH, T4TOTAL, T3FREE, THYROIDAB in the last 72 hours.  Invalid input(s): FREET3 Anemia work up No results for input(s): VITAMINB12, FOLATE, FERRITIN, TIBC, IRON, RETICCTPCT in the last 72 hours. Microbiology Recent Results (from the past 240 hour(s))  Culture, respiratory     Status: None   Collection Time: 04/21/20  9:15 AM   Specimen: Bronchial Washing, Left; Respiratory  Result Value Ref Range Status   Specimen Description BRONCHIAL ALVEOLAR LAVAGE  Final   Special  Requests BRONCHIAL WASHINGS SPEC A  Final   Gram Stain   Final    RARE WBC PRESENT, PREDOMINANTLY MONONUCLEAR NO ORGANISMS SEEN    Culture   Final    NO GROWTH 2 DAYS Performed at [facility], [address], [place], [place] [address]    Report Status 04/23/2020 FINAL  Final  Aerobic/Anaerobic  Culture (surgical/deep wound)     Status: None   Collection Time: 04/21/20 10:04 AM   Specimen: Pleural, Left; Body Fluid  Result Value Ref Range Status   Specimen Description TISSUE  Final   Special Requests PLEURAL DEBRIS SPEC B  Final   Gram Stain   Final    MODERATE WBC PRESENT,BOTH PMN AND MONONUCLEAR NO ORGANISMS SEEN    Culture   Final    No growth aerobically or anaerobically. Performed at [facility], [address], [place], [place] [address]    Report Status 04/26/2020 FINAL  Final     Discharge Instructions:   Discharge Instructions    Diet general   Complete by: As directed    Discharge instructions   Complete by: As directed    Incentive spirometry Go to suboxone clinic   Increase activity slowly   Complete by: As directed    No wound care   Complete by: As directed      Allergies as of 04/30/2020   No Known Allergies     Medication List    TAKE these medications   acidophilus Caps capsule Take 2 capsules by mouth daily. Start taking on: May 01, 2020   albuterol 108 (90 Base) MCG/ACT inhaler Commonly known as: VENTOLIN HFA Inhale 2 puffs into the lungs every 4 (four) hours as needed for wheezing or shortness of breath.   buprenorphine-naloxone 8-2 mg Subl SL tablet Commonly known as: SUBOXONE Place 1 tablet under the tongue 2 (two) times daily.   busPIRone 10 MG tablet Commonly known as: BUSPAR Take 1 tablet (10 mg total) by mouth 3 (three) times daily.   cefdinir 300 MG capsule Commonly known as: OMNICEF Take 2 capsules (600 mg total) by mouth daily. Start taking on: May 01, 2020   cyclobenzaprine 5 MG tablet Commonly known as: FLEXERIL Take 1 tablet (5 mg total) by mouth 3 (three) times daily as needed for muscle spasms.   dextromethorphan-guaiFENesin 30-600 MG 12hr tablet Commonly known as: MUCINEX DM Take 1 tablet by mouth 2 (two) times daily.   gabapentin 300 MG capsule Commonly known as: NEURONTIN Take 1 capsule  (300 mg total) by mouth 2 (two) times daily.   lidocaine 5 % Commonly known as: LIDODERM Place 1 patch onto the skin daily. Remove & Discard patch within 12 hours or as directed by MD Start taking on: May 01, 2020   metoprolol tartrate 25 MG tablet Commonly known as: LOPRESSOR Take 0.5 tablets (12.5 mg total) by mouth 2 (two) times daily.   nicotine 7 mg/24hr patch Commonly known as: NICODERM CQ - dosed in mg/24 hr Place 1 patch (7 mg total) onto the skin daily. Start taking on: May 01, 2020   traZODone 100 MG tablet Commonly known as: DESYREL Take 1 tablet (100 mg total) by mouth at bedtime.       Follow-up Information    Delight Ovens, MD. Go on 05/20/2020.   Specialty: Cardiothoracic Surgery Why: PA/LAT CXR to be taken (at [facility] which is in the same building as Dr. Dennie Maizes office) on 05/20/2020 at 10:00 am;Appointment  time is at 10:30 am Contact information: [address] [place] [address] [phone]        [facility] [place]. Go on 05/11/2020.   Specialty: Cardiothoracic Surgery Why: Appointment is with nurse only for chest tube suture removal. Appointment time is at 10:00 am Contact information: [address], [address] [place] [id number] [phone]       [facility] [place]. Go on 05/05/2020.   Specialty: Cardiothoracic Surgery Why: PA/LAT CXR to be taken (at [facility] which is in the same building as Dr. Dennie MaizesGerhardt's office) on 01/19 at 1:00 pm;Appointment time is at 1:30 pm Contact information: [address], [address] [place] [id number] [phone]       [facility] Follow up on 06/29/2020.   Why: [facility], [address], [place], [place] [address] You have a primary care appointment for 06/29/20 at 1pm Contact information: [address] [place] [address] [phone]        [facility]. Schedule an appointment as soon as possible for a visit on 05/11/2020.   Why: You have a virtual outpatient psychiatry appointment with Dr. Rosalene Billingsegister on May 11, 2020 at 3pm. You will be messaged on your phone priory to your appointment with a link Contact information: [address] [place] [phone]       [facility]. Go on 05/04/2020.   Why: [facility] does intake on Tuesdays and Wednesdays between 3am and 330am.  Contact information: [address], [place], [place] [address] [phone]               Time coordinating discharge: 35 min  Signed:  Joseph ArtJessica U Vann DO  [facility] 04/30/2020, 12:17 PM

## 2020-04-30 NOTE — Progress Notes (Addendum)
      [address]       [place] [address]             [phone]       9 Days Post-Op Procedure(s) (LRB): VIDEO BRONCHOSCOPY WITH BRONCHIAL WASHINGS (Bilateral) VIDEO ASSISTED THORACOSCOPY (VATS)/ MINI THORACOTOMY, DECORTICATION, DRAINAGE OF EMPYEMA, RESECTION OF NECROTIC PORTION OF LEFT LOWER LOBE (Left) INSERTION OF INTERBRONCHIAL VALVE (IBV) ANTERIOR SEGMENT #19 (Left) CHEST TUBE INSERTION x 2 USING 28 BARD AND 28 STRAIGHT TO LEFT PLEURA (Left) INTERCOSTAL NERVE BLOCK (Left)  Subjective: Patient states Neurontin has helped greatly as burning and left breast pain nearly resolved.  Objective: Vital signs in last 24 hours: Temp:  [97.8 F (36.6 C)-98.5 F (36.9 C)] 98.4 F (36.9 C) (01/14 0443) Pulse Rate:  [87-104] 88 (01/14 0443) Cardiac Rhythm: Sinus tachycardia (01/14 0700) Resp:  [16-19] 16 (01/14 0443) BP: (105-113)/(69-85) 112/69 (01/14 0443) SpO2:  [96 %-99 %] 99 % (01/14 0443)      Intake/Output from previous day: 01/13 0701 - 01/14 0700 In: 480 [P.O.:480] Out: 35 [Chest Tube:35]   Physical Exam:  Cardiovascular: RRR Pulmonary: Clear to auscultation on the right and diminished left lateral breath wounds Abdomen: Soft, non tender, bowel sounds present. Extremities: No lower extremity edema. Wounds: Wounds are all, clean and dry, no sign of infection. Chest Tube: to mini express, air leak   Lab Results: CBC: No results for input(s): WBC, HGB, HCT, PLT in the last 72 hours. BMET:  Recent Labs    04/28/20 0058  NA 136  K 4.5  CL 99  CO2 26  GLUCOSE 95  BUN 11  CREATININE 0.60  CALCIUM 9.1    PT/INR: No results for input(s): LABPROT, INR in the last 72 hours. ABG:  INR: Will add last result for INR, ABG once components are confirmed Will add last 4 CBG results once components are confirmed  Assessment/Plan:  1. CV - SR this am. On Lopressor 12.5 mg bid. 2.  Pulmonary - On room air. Chest tube output 40 cc last 24  hours.  Chest tube placed to mini express yesterday. There is an air leak. Encourage incentive spirometer. 3. Anemia-Last H and H 9 and 29.3 4. ID-Transitioned  to oral Cefdinir for 4 weeks for a total of 6 weeks of antibiotic therapy. Gram stain from pleural debris showed no organisms. Culture shows no growth aerobically or anaerobically. 5. With history of previous drug/narcotic abuse, weaned off Dilaudid.  She has Lidocaine patch, Flexeril, Toradol, and Oxy PRN.  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 She was also given Suboxone yesterday. Patient previously on Methadone through [facility] in [place] and patient would like to be set up there again;per primary 6. I have arranged follow up with CXR to see my next week. Hopefully, now that left breast and chest wall pain and burning under much better control, she will be able to be discharged soon  Lelon Huh [facility] 04/30/2020,7:36 AM [phone]  Post hospital care arranged , plan home today I have seen and examined Alyssa Vargas and agree with the above assessment  and plan.  Delight Ovens MD [facility] [phone] Office [phone] 04/30/2020 12:09 PM

## 2020-05-03 ENCOUNTER — Other Ambulatory Visit: Payer: Self-pay | Admitting: Cardiothoracic Surgery

## 2020-05-03 DIAGNOSIS — J90 Pleural effusion, not elsewhere classified: Secondary | ICD-10-CM

## 2020-05-05 ENCOUNTER — Ambulatory Visit
Admission: RE | Admit: 2020-05-05 | Discharge: 2020-05-05 | Disposition: A | Discharge status: Home / Self Care | Payer: Medicaid - Out of State | Source: Ambulatory Visit | Attending: Cardiothoracic Surgery | Admitting: Cardiothoracic Surgery

## 2020-05-05 ENCOUNTER — Ambulatory Visit (INDEPENDENT_AMBULATORY_CARE_PROVIDER_SITE_OTHER): Payer: Self-pay | Admitting: Physician Assistant

## 2020-05-05 ENCOUNTER — Other Ambulatory Visit: Payer: Self-pay

## 2020-05-05 VITALS — BP 108/75 | HR 108 | Temp 97.6°F | Resp 20 | Ht 66.0 in | Wt 110.0 lb

## 2020-05-05 DIAGNOSIS — J95812 Postprocedural air leak: Secondary | ICD-10-CM

## 2020-05-05 DIAGNOSIS — J90 Pleural effusion, not elsewhere classified: Secondary | ICD-10-CM

## 2020-05-05 DIAGNOSIS — Z09 Encounter for follow-up examination after completed treatment for conditions other than malignant neoplasm: Secondary | ICD-10-CM

## 2020-05-05 DIAGNOSIS — J869 Pyothorax without fistula: Secondary | ICD-10-CM

## 2020-05-05 NOTE — Progress Notes (Signed)
HPI:  Patient returns for routine postoperative follow-up having initially undergone a left VATS, drain empyema, and decortication (COVID pneumonia) 04/05/2020. This surgery was followed by a bronchoscopy, left VATS, left mini thoracotomy, decortication, resection of a portion of necrotic LLL, and IBV on 04/21/2020  by Dr. Tyrone Sage. Because of a persistent air leak, left hydropneumothorax, patient was discharged with chest tube to a mini express. Of note, she has been on oral Cefdinir since around 04/15/2020. She has complaints of swelling, pain, and numbness on left side of anterior abdomen and has numbness under left breast (all likely surgically related). She denies abdominal pain, nausea, or vomiting. She has had a very good appetite and \ multiple bowel movements.   Current Outpatient Medications  Medication Sig Dispense Refill  . acidophilus (RISAQUAD) CAPS capsule Take 2 capsules by mouth daily. 60 capsule 0  . albuterol (VENTOLIN HFA) 108 (90 Base) MCG/ACT inhaler Inhale 2 puffs into the lungs every 4 (four) hours as needed for wheezing or shortness of breath. 18 g 0  . buprenorphine-naloxone (SUBOXONE) 8-2 mg SUBL SL tablet Place 1 tablet under the tongue 2 (two) times daily. 8 tablet 0  . busPIRone (BUSPAR) 10 MG tablet Take 1 tablet (10 mg total) by mouth 3 (three) times daily. 90 tablet 0  . cefdinir (OMNICEF) 300 MG capsule Take 2 capsules (600 mg total) by mouth daily. 14 capsule 0  . cyclobenzaprine (FLEXERIL) 5 MG tablet Take 1 tablet (5 mg total) by mouth 3 (three) times daily as needed for muscle spasms. 30 tablet 0  . dextromethorphan-guaiFENesin (MUCINEX DM) 30-600 MG 12hr tablet Take 1 tablet by mouth 2 (two) times daily. 30 tablet 0  . gabapentin (NEURONTIN) 300 MG capsule Take 1 capsule (300 mg total) by mouth 2 (two) times daily. 60 capsule 0  . lidocaine (LIDODERM) 5 % Place 1 patch onto the skin daily. Remove & Discard patch within 12 hours or as directed by MD 30 patch  0  . metoprolol tartrate (LOPRESSOR) 25 MG tablet Take 0.5 tablets (12.5 mg total) by mouth 2 (two) times daily. 60 tablet 0  . nicotine (NICODERM CQ - DOSED IN MG/24 HR) 7 mg/24hr patch Place 1 patch (7 mg total) onto the skin daily. 28 patch 0  . traZODone (DESYREL) 100 MG tablet Take 1 tablet (100 mg total) by mouth at bedtime. 30 tablet 0  Vital Signs: Temp 97.6, RR 20, HR 108, and BP 108/75, Oxygenation 95% on room air    Physical Exam: CV-Tachycardic (has been since surgery) Pulmonary-Clear to auscultation on the right and diminished breath sounds on left Wounds-Clean and dry Chest tube-to mini express, air leak present  Diagnostic Tests: Narrative & Impression  CLINICAL DATA:  LEFT-sided pleural effusion.  EXAM: CHEST - 2 VIEW  COMPARISON:  April 30, 2020  FINDINGS: The cardiomediastinal silhouette is unchanged in contour.LEFT-sided chest tube. There is a moderate LEFT-sided hydropneumothorax, similar comparison to prior. Minimally improved aeration of the LEFT lung. Persistent heterogeneous opacification LEFT lung likely reflecting atelectasis. Visualized abdomen is unremarkable. No acute osseous abnormality.  IMPRESSION: Moderate LEFT-sided hydropneumothorax with LEFT-sided chest tube in place. This is unchanged in comparison to prior.   Electronically Signed   By: Meda Klinefelter MD   On: 05/05/2020 13:41    Impression and Plan: Patient continues to have an air leak so chest tube to remain to mini express. Sutures and staples removed without difficulty. There are no signs of a wound infection. Patient instructed to continue taking  Cefdinir and Lopressor as previously instructed.  Hopefully, left anterior swelling pain, and numbness improve with time as well as breast numbness. She is also on Neurontin for nerve type pain. She likely will need follow up echo regarding previously seen TV regurgitation. She will return in one week to see Dr.  Tyrone Sage.  Ardelle Balls, PA-C [facility] [phone]

## 2020-05-10 NOTE — Progress Notes (Signed)
[address]       [place] [address]             [phone]      Alyssa Vargas [facility] Medical Record #[id number] Date of Birth: 1985/07/29  Referring: Elgergawy, Leana Roe, MD Primary Care: Patient, No Pcp Per Primary Cardiologist: No primary care provider on file.   Chief Complaint:   POST OP FOLLOW UP DATE OF PROCEDURE:  04/21/2020  PREOPERATIVE DIAGNOSIS:  Persistent air leak and poor inflation of the lower lobe following presentation with acute large empyema and COVID infection. POSTOP DIAGNOSIS: Same PROCEDURE PERFORMED: Video bronchoscopy, left video-assisted thoracoscopy, minithoracotomy decortication, resection of portion left lower lobe-necrotic, placement of endobronchial 9 mm IBV valve  OPERATIVE REPORT Preop- Left Chest empyema Postop Same Procedure: Left VATS Drainage of Empyema with decortication  DATE OF PROCEDURE:  04/05/2020  History of Present Illness:     Patient returns to the office after a prolonged hospitalization which started with admission to [facility] where she stayed several days had a thoracentesis of the left chest that showed purulent material.  The left chest at that time was "total white out".  The patient was also Covid positive.  Ultimately after a delay of several days she arrived at [facility] for further evaluation.  A left VATS was done in a chest tubes were placed to drain the effusion at this point she was critically ill.  Over 10-day.  She slowly improved but had a persistent air leak and incomplete inflation of the lower lobe of her lung.  She was returned to the OR where an area of necrotic lower lobe was removed further decortication and placement of bronchial valve was done.  This improved the space situation and significantly decreased air leak but did not completely stop it.  She was ultimately discharged home with follow-up appointment for psychiatry, Suboxone clinic in [place], and primary  care.  Unfortunately after discharge she had no follow-up other than in the surgical office today.  She had run out of her her Suboxone.   She had a previous history of endocarditis treated at [facility], while hospitalized at [facility] TEE raised the issue of possible endocarditis, she was seen by ID this regard, antibiotic treatment was recommended.  She had no follow-up in ID clinic.  She has now completed her course of antibiotics that she was discharged home with.  Overall she feels well denies fever chills, left chest tube still in place she appears to be taking care of this adequately      Social History   Tobacco Use  Smoking Status Current Every Day Smoker  . Packs/day: 0.50  . Types: Cigarettes  Smokeless Tobacco Never Used    Social History   Substance and Sexual Activity  Alcohol Use Not Currently     No Known Allergies  Current Outpatient Medications  Medication Sig Dispense Refill  . acidophilus (RISAQUAD) CAPS capsule Take 2 capsules by mouth daily. 60 capsule 0  . albuterol (VENTOLIN HFA) 108 (90 Base) MCG/ACT inhaler Inhale 2 puffs into the lungs every 4 (four) hours as needed for wheezing or shortness of breath. 18 g 0  . busPIRone (BUSPAR) 10 MG tablet Take 1 tablet (10 mg total) by mouth 3 (three) times daily. 90 tablet 0  . cyclobenzaprine (FLEXERIL) 5 MG tablet Take 1 tablet (5 mg total) by mouth 3 (three) times daily as needed for muscle spasms. 30 tablet 0  .  dextromethorphan-guaiFENesin (MUCINEX DM) 30-600 MG 12hr tablet Take 1 tablet by mouth 2 (two) times daily. 30 tablet 0  . gabapentin (NEURONTIN) 300 MG capsule Take 1 capsule (300 mg total) by mouth 2 (two) times daily. 60 capsule 0  . lidocaine (LIDODERM) 5 % Place 1 patch onto the skin daily. Remove & Discard patch within 12 hours or as directed by MD 30 patch 0  . metoprolol tartrate (LOPRESSOR) 25 MG tablet Take 0.5 tablets (12.5 mg total) by mouth 2 (two) times daily. 60 tablet 0   . nicotine (NICODERM CQ - DOSED IN MG/24 HR) 7 mg/24hr patch Place 1 patch (7 mg total) onto the skin daily. 28 patch 0  . traZODone (DESYREL) 100 MG tablet Take 1 tablet (100 mg total) by mouth at bedtime. 30 tablet 0  . buprenorphine-naloxone (SUBOXONE) 8-2 mg SUBL SL tablet Place 1 tablet under the tongue 2 (two) times daily. (Patient not taking: Reported on 05/12/2020) 8 tablet 0  . cefdinir (OMNICEF) 300 MG capsule Take 2 capsules (600 mg total) by mouth daily. (Patient not taking: Reported on 05/12/2020) 14 capsule 0   No current facility-administered medications for this visit.       Physical Exam: BP 100/71   Pulse 90   Temp 97.6 F (36.4 C) (Skin)   Resp 20   Ht 5\' 6"  (1.676 m)   Wt 111 lb (50.3 kg)   SpO2 100% Comment: RA  BMI 17.92 kg/m   General appearance: alert, cooperative and no distress Neurologic: intact Heart: regular rate and rhythm, S1, S2 normal, no murmur, click, rub or gallop Lungs: diminished breath sounds LLL Abdomen: soft, non-tender; bowel sounds normal; no masses,  no organomegaly Extremities: extremities normal, atraumatic, no cyanosis or edema and Homans sign is negative, no sign of DVT Wound: Chest tube sites are intact, single chest tube is in place there is still a small air leak with cough.    Diagnostic Studies & Laboratory data:     Recent Radiology Findings:  DG Chest 2 View  Result Date: 05/12/2020 CLINICAL DATA:  Status post VATS. EXAM: CHEST - 2 VIEW COMPARISON:  05/05/20 FINDINGS: Left chest tube is stable in position. The left-sided hydropneumothorax is again identified. This is stable to mildly decreased in volume from previous exam. Right lung appears clear. Visualized osseous structures are unremarkable. IMPRESSION: Stable to mildly decreased volume of left hydropneumothorax. Unchanged position of left chest tube. Electronically Signed   By: 05/07/20 M.D.   On: 05/12/2020 15:40   .   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  Plan to see her back with a follow-up chest x-ray in 1 week   Medication Changes: No orders of the defined types were placed in this encounter.     Delight Ovens MD      [address] [place] [place] [address] Office [phone]     05/18/2020 8:25 AM

## 2020-05-11 ENCOUNTER — Other Ambulatory Visit: Payer: Self-pay | Admitting: Cardiothoracic Surgery

## 2020-05-11 DIAGNOSIS — J90 Pleural effusion, not elsewhere classified: Secondary | ICD-10-CM

## 2020-05-12 ENCOUNTER — Ambulatory Visit
Admission: RE | Admit: 2020-05-12 | Discharge: 2020-05-12 | Disposition: A | Discharge status: Home / Self Care | Payer: Medicaid - Out of State | Source: Ambulatory Visit | Attending: Cardiothoracic Surgery | Admitting: Cardiothoracic Surgery

## 2020-05-12 ENCOUNTER — Ambulatory Visit (INDEPENDENT_AMBULATORY_CARE_PROVIDER_SITE_OTHER): Payer: Self-pay | Admitting: Cardiothoracic Surgery

## 2020-05-12 ENCOUNTER — Other Ambulatory Visit: Payer: Self-pay

## 2020-05-12 VITALS — BP 100/71 | HR 90 | Temp 97.6°F | Resp 20 | Ht 66.0 in | Wt 111.0 lb

## 2020-05-12 DIAGNOSIS — F411 Generalized anxiety disorder: Secondary | ICD-10-CM | POA: Insufficient documentation

## 2020-05-12 DIAGNOSIS — J869 Pyothorax without fistula: Secondary | ICD-10-CM

## 2020-05-12 DIAGNOSIS — J95812 Postprocedural air leak: Secondary | ICD-10-CM

## 2020-05-12 DIAGNOSIS — J90 Pleural effusion, not elsewhere classified: Secondary | ICD-10-CM

## 2020-05-12 DIAGNOSIS — F329 Major depressive disorder, single episode, unspecified: Secondary | ICD-10-CM | POA: Insufficient documentation

## 2020-05-12 DIAGNOSIS — Z09 Encounter for follow-up examination after completed treatment for conditions other than malignant neoplasm: Secondary | ICD-10-CM

## 2020-05-19 ENCOUNTER — Other Ambulatory Visit: Payer: Self-pay | Admitting: Cardiothoracic Surgery

## 2020-05-19 DIAGNOSIS — J90 Pleural effusion, not elsewhere classified: Secondary | ICD-10-CM

## 2020-05-20 ENCOUNTER — Encounter: Payer: Medicaid - Out of State | Admitting: Cardiothoracic Surgery

## 2020-05-20 ENCOUNTER — Encounter: Payer: Self-pay | Admitting: Cardiothoracic Surgery

## 2020-05-20 ENCOUNTER — Other Ambulatory Visit: Payer: Self-pay

## 2020-05-20 ENCOUNTER — Ambulatory Visit: Payer: PRIVATE HEALTH INSURANCE | Admitting: Internal Medicine

## 2020-05-20 ENCOUNTER — Ambulatory Visit
Admission: RE | Admit: 2020-05-20 | Discharge: 2020-05-20 | Disposition: A | Discharge status: Home / Self Care | Payer: PRIVATE HEALTH INSURANCE | Source: Ambulatory Visit | Attending: Cardiothoracic Surgery | Admitting: Cardiothoracic Surgery

## 2020-05-20 ENCOUNTER — Ambulatory Visit (INDEPENDENT_AMBULATORY_CARE_PROVIDER_SITE_OTHER): Payer: Self-pay | Admitting: Cardiothoracic Surgery

## 2020-05-20 VITALS — BP 113/83 | HR 113 | Resp 20 | Ht 66.0 in

## 2020-05-20 DIAGNOSIS — Z09 Encounter for follow-up examination after completed treatment for conditions other than malignant neoplasm: Secondary | ICD-10-CM

## 2020-05-20 DIAGNOSIS — J90 Pleural effusion, not elsewhere classified: Secondary | ICD-10-CM

## 2020-05-20 NOTE — Progress Notes (Deleted)
[facility]  Reason for Consult: Hospital follow up for empyema  Referring Provider: Dr. Tyrone Sage   HPI:    Alyssa Vargas is a 35 y.o. female with PMHx as below who presents to the clinic for follow-up of empyema.   She was recently hospitalized from April 01, 2020 through April 30, 2020 due to Covid 19 infection complicated by acute hypoxic respiratory failure from large left-sided empyema.  She is status post VATS procedure on December 20 with pleural fluid culture positive for Streptococcus pneumonia.  Blood cultures were negative.  She received 2 weeks of ceftriaxone inpatient date of surgery and then was transitioned to oral cefdinir to complete 4 additional weeks (6 weeks total).  Hospital notes this plan was discussed with inpatient ID service however this was stopped otherwise documented.  After her initial surgery she slowly improved but had persistent air leak and incomplete inflation of the lower lung.  She returned to the OR on January 5 where an area of necrotic lower lobe was removed, further decortication and placement of bronchial valve was done.  She was seen for follow-up of the thoracic surgery office on January 26.  Denied fevers, chills.  Left chest tube was still in place but she appeared to be taking care of at adequately.  She was seen again today in the clinic and ***.  CXR today reviewed and shows stable left hydropneumothorax and left chest tube.  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  This was treated [facility].  Transthoracic echo during her recent admission noted tricuspid valve thickening with regurgitation.  However, there was no gross vegetations noted.  Patient's Medications  New Prescriptions   No medications on file  Previous Medications   ACIDOPHILUS (RISAQUAD) CAPS CAPSULE    Take 2 capsules by mouth daily.   ALBUTEROL (VENTOLIN HFA) 108 (90 BASE)  MCG/ACT INHALER    Inhale 2 puffs into the lungs every 4 (four) hours as needed for wheezing or shortness of breath.   BUPRENORPHINE-NALOXONE (SUBOXONE) 8-2 MG SUBL SL TABLET    Place 1 tablet under the tongue 2 (two) times daily.   BUSPIRONE (BUSPAR) 10 MG TABLET    Take 1 tablet (10 mg total) by mouth 3 (three) times daily.   CEFDINIR (OMNICEF) 300 MG CAPSULE    Take 2 capsules (600 mg total) by mouth daily.   CYCLOBENZAPRINE (FLEXERIL) 5 MG TABLET    Take 1 tablet (5 mg total) by mouth 3 (three) times daily as needed for muscle spasms.   DEXTROMETHORPHAN-GUAIFENESIN (MUCINEX DM) 30-600 MG 12HR TABLET    Take 1 tablet by mouth 2 (two) times daily.   GABAPENTIN (NEURONTIN) 300 MG CAPSULE    Take 1 capsule (300 mg total) by mouth 2 (two) times daily.   LIDOCAINE (LIDODERM) 5 %    Place 1 patch onto the skin daily. Remove & Discard patch within 12 hours or as directed by MD   METOPROLOL TARTRATE (LOPRESSOR) 25 MG TABLET    Take 0.5 tablets (12.5 mg total) by mouth 2 (two) times daily.   NICOTINE (NICODERM CQ - DOSED IN MG/24 HR) 7 MG/24HR PATCH    Place 1 patch (7 mg total) onto the skin daily.   TRAZODONE (DESYREL) 100 MG TABLET    Take 1 tablet (100 mg total) by mouth at bedtime.  Modified Medications   No medications on file  Discontinued Medications  No medications on file      No past medical history on file.  Social History   Tobacco Use  . Smoking status: Current Every Day Smoker    Packs/day: 0.50    Types: Cigarettes  . Smokeless tobacco: Never Used  Vaping Use  . Vaping Use: Never used  Substance Use Topics  . Alcohol use: Not Currently  . Drug use: Not Currently    Comment: hx of opoid abuse and methadone    No family history on file.  No Known Allergies  ROS    OBJECTIVE:    There were no vitals filed for this visit.   There is no height or weight on file to calculate BMI.  Physical Exam   Labs and Microbiology:  CBC Latest Ref Rng & Units 04/25/2020  04/23/2020 04/22/2020  WBC 4.0 - 10.5 K/uL 5.1 5.1 6.6  Hemoglobin 12.0 - 15.0 g/dL 9.0(L) 8.7(L) 8.1(L)  Hematocrit 36.0 - 46.0 % 29.3(L) 28.7(L) 24.5(L)  Platelets 150 - 400 K/uL 498(H) 421(H) 388   CMP Latest Ref Rng & Units 04/28/2020 04/25/2020 04/23/2020  Glucose 70 - 99 mg/dL 95 97 333(L)  BUN 6 - 20 mg/dL 11 13 13   Creatinine 0.44 - 1.00 mg/dL 4.56 2.56  Sodium 135 - 145 mmol/L 136 138 141  Potassium 3.5 - 5.1 mmol/L 4.5 4.1 4.2  Chloride 98 - 111 mmol/L 99 104 106  CO2 22 - 32 mmol/L 26 24 27   Calcium 8.9 - 10.3 mg/dL 9.1 3.89) 8.3(L)  Total Protein 6.5 - 8.1 g/dL - - 5.5(L)  Total Bilirubin 0.3 - 1.2 mg/dL - - 0.3  Alkaline Phos 38 - 126 U/L - - 53  AST 15 - 41 U/L - - 21  ALT 0 - 44 U/L - - 18     No results found for this or any previous visit (from the past 240 hour(s)).  Imaging: IMPRESSION: Stable appearance of left hydropneumothorax. Stable position of left-sided chest tube.   ASSESSMENT & PLAN:    No problem-specific Assessment & Plan notes found for this encounter.   No orders of the defined types were placed in this encounter.     ***  for Infectious Disease [facility] 05/20/2020, 2:42 PM

## 2020-05-20 NOTE — Progress Notes (Signed)
[address]       [place] [address]             [phone]      Alyssa Vargas [facility] Medical Record #[id number] Date of Birth: 1985-12-23  Referring: Elgergawy, Leana Roe, MD Primary Care: Patient, No Pcp Per Primary Cardiologist: No primary care provider on file.   Chief Complaint:   POST OP FOLLOW UP DATE OF PROCEDURE:  04/21/2020  PREOPERATIVE DIAGNOSIS:  Persistent air leak and poor inflation of the lower lobe following presentation with acute large empyema and COVID infection. POSTOP DIAGNOSIS: Same PROCEDURE PERFORMED: Video bronchoscopy, left video-assisted thoracoscopy, minithoracotomy decortication, resection of portion left lower lobe-necrotic, placement of endobronchial 9 mm IBV valve  OPERATIVE REPORT Preop- Left Chest empyema Postop Same Procedure: Left VATS Drainage of Empyema with decortication  DATE OF PROCEDURE:  04/05/2020  History of Present Illness:     Patient returns to the office after a prolonged hospitalization which started with admission to [facility] where she stayed several days had a thoracentesis of the left chest that showed purulent material.  The left chest at that time was "total white out".  The patient was also Covid positive.  Ultimately after a delay of several days she arrived at [facility] for further evaluation.  A left VATS was done in a chest tubes were placed to drain the effusion at this point she was critically ill.  Over 10-day.  She slowly improved but had a persistent air leak and incomplete inflation of the lower lobe of her lung.  She was returned to the OR where an area of necrotic lower lobe was removed further decortication and placement of bronchial valve was done.  This improved the space situation and significantly decreased air leak but did not completely stop it.  She was ultimately discharged home with follow-up appointment for psychiatry, Suboxone clinic in [place], and primary  care.  Unfortunately after discharge she had no follow-up other than in the surgical office today.  She had run out of her her Suboxone.   She had a previous history of endocarditis treated at [facility], while hospitalized at [facility] TEE raised the issue of possible endocarditis, she was seen by ID this regard, antibiotic treatment was recommended.  She had no follow-up in ID clinic.  She has now completed her course of antibiotics that she was discharged home with.  Overall she feels well denies fever chills, left chest tube still in place she appears to be taking care of this adequately      Social History   Tobacco Use  Smoking Status Current Every Day Smoker  . Packs/day: 0.50  . Types: Cigarettes  Smokeless Tobacco Never Used    Social History   Substance and Sexual Activity  Alcohol Use Not Currently     No Known Allergies  Current Outpatient Medications  Medication Sig Dispense Refill  . acidophilus (RISAQUAD) CAPS capsule Take 2 capsules by mouth daily. 60 capsule 0  . albuterol (VENTOLIN HFA) 108 (90 Base) MCG/ACT inhaler Inhale 2 puffs into the lungs every 4 (four) hours as needed for wheezing or shortness of breath. 18 g 0  . busPIRone (BUSPAR) 10 MG tablet Take 1 tablet (10 mg total) by mouth 3 (three) times daily. 90 tablet 0  . cyclobenzaprine (FLEXERIL) 5 MG tablet Take 1 tablet (5 mg total) by mouth 3 (three) times daily as needed for muscle spasms. 30 tablet 0  .  dextromethorphan-guaiFENesin (MUCINEX DM) 30-600 MG 12hr tablet Take 1 tablet by mouth 2 (two) times daily. 30 tablet 0  . gabapentin (NEURONTIN) 300 MG capsule Take 1 capsule (300 mg total) by mouth 2 (two) times daily. 60 capsule 0  . lidocaine (LIDODERM) 5 % Place 1 patch onto the skin daily. Remove & Discard patch within 12 hours or as directed by MD 30 patch 0  . metoprolol tartrate (LOPRESSOR) 25 MG tablet Take 0.5 tablets (12.5 mg total) by mouth 2 (two) times daily. 60 tablet 0   . nicotine (NICODERM CQ - DOSED IN MG/24 HR) 7 mg/24hr patch Place 1 patch (7 mg total) onto the skin daily. 28 patch 0  . traZODone (DESYREL) 100 MG tablet Take 1 tablet (100 mg total) by mouth at bedtime. 30 tablet 0  . buprenorphine-naloxone (SUBOXONE) 8-2 mg SUBL SL tablet Place 1 tablet under the tongue 2 (two) times daily. (Patient not taking: Reported on 05/12/2020) 8 tablet 0  . cefdinir (OMNICEF) 300 MG capsule Take 2 capsules (600 mg total) by mouth daily. (Patient not taking: Reported on 05/12/2020) 14 capsule 0   No current facility-administered medications for this visit.       Physical Exam: BP 113/83 (BP Location: Left Arm, Patient Position: Sitting)   Pulse (!) 113   Resp 20   Ht 5\' 6"  (1.676 m)   LMP 05/07/2020 (Exact Date)   SpO2 100% Comment: RA with mask on  BMI 17.92 kg/m   General appearance: alert, cooperative and no distress Neurologic: intact Heart: regular rate and rhythm, S1, S2 normal, no murmur, click, rub or gallop Lungs: diminished breath sounds LLL Abdomen: soft, non-tender; bowel sounds normal; no masses,  no organomegaly Extremities: extremities normal, atraumatic, no cyanosis or edema and Homans sign is negative, no sign of DVT Wound: Chest tube sites are intact, single chest tube is in place there is still a small air leak with cough.    Diagnostic Studies & Laboratory data:     Recent Radiology Findings:  DG Chest 2 View  Result Date: 05/12/2020 CLINICAL DATA:  Status post VATS. EXAM: CHEST - 2 VIEW COMPARISON:  05/05/20 FINDINGS: Left chest tube is stable in position. The left-sided hydropneumothorax is again identified. This is stable to mildly decreased in volume from previous exam. Right lung appears clear. Visualized osseous structures are unremarkable. IMPRESSION: Stable to mildly decreased volume of left hydropneumothorax. Unchanged position of left chest tube. Electronically Signed   By: 05/07/20 M.D.   On: 05/12/2020 15:40    .   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  Further calls today she does t have an appointment at the office, and also for medication renewalincluding Suboxone   Plan to see her back with a follow-up chest x-ray in 1 week-likely remove chest tube next week if still no airleak and airspace chest is decreased in size further.   Medication Changes: No orders of the defined types were placed in this encounter.     Delight Ovens MD      [address] [place] [place] [address] Office [phone]     05/20/2020 3:29 PM

## 2020-05-21 ENCOUNTER — Telehealth: Payer: Self-pay | Admitting: General Practice

## 2020-05-21 NOTE — Telephone Encounter (Signed)
Copied from CRM [id number]. Topic: Appointment Scheduling - Scheduling Inquiry for Clinic >> May 20, 2020  1:27 PM Randol Kern wrote: Reason for CRM: Darl Pikes from [facility] called to schedule the patient a hosp fu. Pt was discharged 04/30/2020 for having Covid. Please advise, [facility] does not have hosp fu option for virtual.   Best contact: [phone]  Called patient and LVM advising her to call back and schedule. Ok to schedule HFU and then just put in the notes *tele. Patient could also call [phone] (primary care [place]) to be scheduled with the mobile medicine unit.

## 2020-05-25 ENCOUNTER — Encounter: Payer: Self-pay | Admitting: Internal Medicine

## 2020-05-25 ENCOUNTER — Other Ambulatory Visit: Payer: Self-pay

## 2020-05-25 ENCOUNTER — Ambulatory Visit (INDEPENDENT_AMBULATORY_CARE_PROVIDER_SITE_OTHER): Payer: PRIVATE HEALTH INSURANCE | Admitting: Internal Medicine

## 2020-05-25 VITALS — BP 110/79 | HR 111 | Temp 98.0°F | Ht 66.0 in | Wt 115.0 lb

## 2020-05-25 DIAGNOSIS — F1991 Other psychoactive substance use, unspecified, in remission: Secondary | ICD-10-CM

## 2020-05-25 DIAGNOSIS — J869 Pyothorax without fistula: Secondary | ICD-10-CM

## 2020-05-25 DIAGNOSIS — U071 COVID-19: Secondary | ICD-10-CM

## 2020-05-25 DIAGNOSIS — Z8679 Personal history of other diseases of the circulatory system: Secondary | ICD-10-CM

## 2020-05-25 DIAGNOSIS — Z87898 Personal history of other specified conditions: Secondary | ICD-10-CM

## 2020-05-25 NOTE — Progress Notes (Signed)
[facility]  Reason for Consult: Hospital follow up for empyema  Referring Provider: Dr. Tyrone Sage   HPI:    Alyssa Vargas is a 35 y.o. female with PMHx as below who presents to the clinic for follow-up of empyema.   The note below was pended from a previous visit where patient did not show and was copy forwarded for today's visit purposes:  She was recently hospitalized from April 01, 2020 through April 30, 2020 due to Covid 19 infection complicated by acute hypoxic respiratory failure from large left-sided empyema.  She is status post VATS procedure on December 20 with pleural fluid culture positive for Streptococcus pneumonia.  Blood cultures were negative.  She received 2 weeks of ceftriaxone inpatient from date of surgery and then was transitioned to oral cefdinir to complete 4 additional weeks (6 weeks total). Hospital notes indicate this plan was discussed with inpatient ID service however this was not otherwise documented.  After her initial surgery she slowly improved but had persistent air leak and incomplete inflation of the lower lung.  She returned to the OR on January 5 where an area of necrotic lower lobe was removed, further decortication and placement of bronchial valve was done.  She was seen for follow-up of the thoracic surgery office on January 26 and February 3.  Denied fevers, chills.  Left chest tube was still in place but she appeared to be taking care of at adequately.  CXR today reviewed and shows stable left hydropneumothorax and left chest tube.  She is going back to their office for follow up later this week for hopefully removal of chest tube.  She completed her antibiotics 2 weeks ago and has been doing well without any signs/symptoms of infection.   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  This was treated [facility].  Transthoracic echo during her recent admission  noted tricuspid valve thickening with regurgitation.  However, there was no gross vegetations noted.  She has not used needles for about 6 years and is now clean from opiates for about 2 months and doing well on Suboxone.  Patient's Medications  New Prescriptions   No medications on file  Previous Medications   ACIDOPHILUS (RISAQUAD) CAPS CAPSULE    Take 2 capsules by mouth daily.   ALBUTEROL (VENTOLIN HFA) 108 (90 BASE) MCG/ACT INHALER    Inhale 2 puffs into the lungs every 4 (four) hours as needed for wheezing or shortness of breath.   BUPRENORPHINE-NALOXONE (SUBOXONE) 8-2 MG SUBL SL TABLET    Place 1 tablet under the tongue 2 (two) times daily.   BUSPIRONE (BUSPAR) 10 MG TABLET    Take 1 tablet (10 mg total) by mouth 3 (three) times daily.   CYCLOBENZAPRINE (FLEXERIL) 5 MG TABLET    Take 1 tablet (5 mg total) by mouth 3 (three) times daily as needed for muscle spasms.   DEXTROMETHORPHAN-GUAIFENESIN (MUCINEX DM) 30-600 MG 12HR TABLET    Take 1 tablet by mouth 2 (two) times daily.   GABAPENTIN (NEURONTIN) 300 MG CAPSULE    Take 1 capsule (300 mg total) by mouth 2 (two) times daily.   LIDOCAINE (LIDODERM) 5 %    Place 1 patch onto the skin daily. Remove & Discard patch within 12 hours or as directed by MD   METOPROLOL TARTRATE (LOPRESSOR) 25 MG TABLET    Take 0.5 tablets (12.5 mg total) by mouth 2 (two)  times daily.   NICOTINE (NICODERM CQ - DOSED IN MG/24 HR) 7 MG/24HR PATCH    Place 1 patch (7 mg total) onto the skin daily.   TRAZODONE (DESYREL) 100 MG TABLET    Take 1 tablet (100 mg total) by mouth at bedtime.  Modified Medications   No medications on file  Discontinued Medications   CEFDINIR (OMNICEF) 300 MG CAPSULE    Take 2 capsules (600 mg total) by mouth daily.      No past medical history on file.  Social History   Tobacco Use  . Smoking status: Former Smoker    Packs/day: 0.50    Types: Cigarettes    Quit date: 03/17/2020    Years since quitting: 0.1  . Smokeless tobacco:  Never Used  Vaping Use  . Vaping Use: Never used  Substance Use Topics  . Alcohol use: Not Currently  . Drug use: Not Currently    Comment: hx of opoid abuse and methadone    No family history on file.  No Known Allergies  Review of Systems  Constitutional: Negative for chills and fever.  Respiratory: Negative for cough, sputum production and shortness of breath.   Cardiovascular: Negative for chest pain.  Musculoskeletal:       + discomfort at chest tube site.   Skin: Negative for rash.      OBJECTIVE:    Vitals:   05/25/20 1459  BP: 110/79  Pulse: (!) 111  Temp: 98 F (36.7 C)  Weight: 115 lb (52.2 kg)  Height: 5\' 6"  (1.676 m)     Body mass index is 18.56 kg/m.  Physical Exam Constitutional:      General: She is not in acute distress.    Appearance: Normal appearance.  Cardiovascular:     Rate and Rhythm: Regular rhythm. Tachycardia present.     Heart sounds: No murmur heard.   Pulmonary:     Effort: Pulmonary effort is normal. No respiratory distress.     Breath sounds: Normal breath sounds.     Comments: Left sided chest tube in place.  Skin:    General: Skin is warm and dry.  Neurological:     General: No focal deficit present.     Mental Status: She is alert and oriented to person, place, and time.  Psychiatric:        Mood and Affect: Mood normal.        Behavior: Behavior normal.      Labs and Microbiology:  CBC Latest Ref Rng & Units 04/25/2020 04/23/2020 04/22/2020  WBC 4.0 - 10.5 K/uL 5.1 5.1 6.6  Hemoglobin 12.0 - 15.0 g/dL 9.0(L) 8.7(L) 8.1(L)  Hematocrit 36.0 - 46.0 % 29.3(L) 28.7(L) 24.5(L)  Platelets 150 - 400 K/uL 498(H) 421(H) 388   CMP Latest Ref Rng & Units 04/28/2020 04/25/2020 04/23/2020  Glucose 70 - 99 mg/dL 95 97 06/21/2020)  BUN 6 - 20 mg/dL 11 13 13   Creatinine 0.44 - 1.00 mg/dL 644(I 3.47  Sodium 135 - 145 mmol/L 136 138 141  Potassium 3.5 - 5.1 mmol/L 4.5 4.1 4.2  Chloride 98 - 111 mmol/L 99 104 106  CO2 22 - 32 mmol/L 26  24 27   Calcium 8.9 - 10.3 mg/dL 9.1 4.25) 8.3(L)  Total Protein 6.5 - 8.1 g/dL - - 5.5(L)  Total Bilirubin 0.3 - 1.2 mg/dL - - 0.3  Alkaline Phos 38 - 126 U/L - - 53  AST 15 - 41 U/L - - 21  ALT 0 -  44 U/L - - 18     No results found for this or any previous visit (from the past 240 hour(s)).  Imaging: IMPRESSION: Stable appearance of left hydropneumothorax. Stable position of left-sided chest tube.   ASSESSMENT & PLAN:    1. Recent COVID pneumonia complicated by Strep pneumo empyema s/p left VATS drainage of empyema with decortication on 04/05/20 and further decortication and placement of bronchial valve on 04/21/20.  She completed 6 weeks of antibiotics and chest tube remains in place.  Currently doing well off antibiotics for 2 weeks and would favor continuing to monitor her clinically.   2. History of IVDU and previous endocarditis about 8 years ago.  Her blood cultures last admission were negative.  TTE with some TV thickening but no gross vegetation.  She is doing well off antibiotics and discussed with patient that she should discuss with her primary care or dentist in the future for abx prophylaxis prior to procedures given her history.  She is also doing well on Suboxone therapy and is going to a clinic near her home in [place] for this.   Follow up with ID as needed.   Vedia Coffer for Infectious Disease [facility] 05/25/2020, 3:15 PM

## 2020-05-25 NOTE — Patient Instructions (Signed)
Thank you for coming to see me today. It was a pleasure seeing you.  To Do: Marland Kitchen Continue to follow up with your surgeon for chest tube management . Continue to monitor your symptoms off antibiotics  If you have any questions or concerns, please do not hesitate to call the office at [phone].  Take Care,   Gwynn Burly, DO

## 2020-05-26 ENCOUNTER — Other Ambulatory Visit: Payer: Self-pay | Admitting: Cardiothoracic Surgery

## 2020-05-26 DIAGNOSIS — J948 Other specified pleural conditions: Secondary | ICD-10-CM

## 2020-05-27 ENCOUNTER — Ambulatory Visit (INDEPENDENT_AMBULATORY_CARE_PROVIDER_SITE_OTHER): Payer: Self-pay | Admitting: Cardiothoracic Surgery

## 2020-05-27 ENCOUNTER — Other Ambulatory Visit: Payer: Self-pay

## 2020-05-27 ENCOUNTER — Ambulatory Visit
Admission: RE | Admit: 2020-05-27 | Discharge: 2020-05-27 | Disposition: A | Discharge status: Home / Self Care | Payer: PRIVATE HEALTH INSURANCE | Source: Ambulatory Visit | Attending: Cardiothoracic Surgery | Admitting: Cardiothoracic Surgery

## 2020-05-27 VITALS — BP 112/72 | HR 110 | Resp 20 | Ht 66.0 in | Wt 115.0 lb

## 2020-05-27 DIAGNOSIS — J948 Other specified pleural conditions: Secondary | ICD-10-CM

## 2020-05-27 DIAGNOSIS — Z09 Encounter for follow-up examination after completed treatment for conditions other than malignant neoplasm: Secondary | ICD-10-CM

## 2020-05-27 DIAGNOSIS — J95812 Postprocedural air leak: Secondary | ICD-10-CM

## 2020-05-27 NOTE — Progress Notes (Signed)
[address]       [place] [address]             [phone]      A[facility]re [facility] Medical Record #[id number] Date of Birth: 10-28-1985  Referring: Elgergawy, Leana Roe, MD Primary Care: Patient, No Pcp Per Primary Cardiologist: No primary care provider on file.   Chief Complaint:   POST OP FOLLOW UP DATE OF PROCEDURE:  04/21/2020  PREOPERATIVE DIAGNOSIS:  Persistent air leak and poor inflation of the lower lobe following presentation with acute large empyema and COVID infection. POSTOP DIAGNOSIS: Same PROCEDURE PERFORMED: Video bronchoscopy, left video-assisted thoracoscopy, minithoracotomy decortication, resection of portion left lower lobe-necrotic, placement of endobronchial 9 mm IBV valve  OPERATIVE REPORT Preop- Left Chest empyema Postop Same Procedure: Left VATS Drainage of Empyema with decortication  DATE OF PROCEDURE:  04/05/2020  History of Present Illness:     Patient returns to the office after a prolonged hospitalization which started with admission to [facility] where she stayed several days had a thoracentesis of the left chest that showed purulent material.  The left chest at that time was "total white out".  The patient was also Covid positive.  Ultimately after a delay of several days she arrived at [facility] for further evaluation.  A left VATS was done in a chest tubes were placed to drain the effusion at this point she was critically ill.  Over 10-day.  She slowly improved but had a persistent air leak and incomplete inflation of the lower lobe of her lung.  She was returned to the OR where an area of necrotic lower lobe was removed further decortication and placement of bronchial valve was done.  This improved the space situation and significantly decreased air leak but did not completely stop it.  She was ultimately discharged home with follow-up appointment for psychiatry, Suboxone clinic in [place], and primary  care.  Unfortunately after discharge she had no follow-up other than in the surgical office today.  She had run out of her her Suboxone.   She had a previous history of endocarditis treated at [facility], while hospitalized at [facility] raised the issue of possible endocarditis, she was seen by ID this regard, antibiotic treatment was recommended.  She had no follow-up in ID clinic.  She has now completed her course of antibiotics that she was discharged home with.  Patient returns to the office today with a follow-up chest x-ray.  She has had little drainage from the tube since seen last week and has not seen evidence of palpable.      Social History   Tobacco Use  Smoking Status Former Smoker  . Packs/day: 0.50  . Types: Cigarettes  . Quit date: 03/17/2020  . Years since quitting: 0.1  Smokeless Tobacco Never Used    Social History   Substance and Sexual Activity  Alcohol Use Not Currently     No Known Allergies  Current Outpatient Medications  Medication Sig Dispense Refill  . acidophilus (RISAQUAD) CAPS capsule Take 2 capsules by mouth daily. 60 capsule 0  . albuterol (VENTOLIN HFA) 108 (90 Base) MCG/ACT inhaler Inhale 2 puffs into the lungs every 4 (four) hours as needed for wheezing or shortness of breath. 18 g 0  . buprenorphine-naloxone (SUBOXONE) 8-2 mg SUBL SL tablet Place 1 tablet under the tongue 2 (two) times daily. 8 tablet 0  . busPIRone (BUSPAR) 10 MG tablet Take 1  tablet (10 mg total) by mouth 3 (three) times daily. 90 tablet 0  . cyclobenzaprine (FLEXERIL) 5 MG tablet Take 1 tablet (5 mg total) by mouth 3 (three) times daily as needed for muscle spasms. 30 tablet 0  . dextromethorphan-guaiFENesin (MUCINEX DM) 30-600 MG 12hr tablet Take 1 tablet by mouth 2 (two) times daily. 30 tablet 0  . gabapentin (NEURONTIN) 300 MG capsule Take 1 capsule (300 mg total) by mouth 2 (two) times daily. 60 capsule 0  . lidocaine (LIDODERM) 5 % Place 1 patch  onto the skin daily. Remove & Discard patch within 12 hours or as directed by MD 30 patch 0  . metoprolol tartrate (LOPRESSOR) 25 MG tablet Take 0.5 tablets (12.5 mg total) by mouth 2 (two) times daily. 60 tablet 0  . nicotine (NICODERM CQ - DOSED IN MG/24 HR) 7 mg/24hr patch Place 1 patch (7 mg total) onto the skin daily. 28 patch 0  . traZODone (DESYREL) 100 MG tablet Take 1 tablet (100 mg total) by mouth at bedtime. 30 tablet 0   No current facility-administered medications for this visit.       Physical Exam: BP 112/72   Pulse (!) 110   Resp 20   Ht 5\' 6"  (1.676 m)   Wt 115 lb (52.2 kg)   LMP 05/07/2020 (Exact Date)   SpO2 96% Comment: RA  BMI 18.56 kg/m   General appearance: alert, cooperative and no distress Neurologic: intact Heart: regular rate and rhythm, S1, S2 normal, no murmur, click, rub or gallop Lungs: diminished breath sounds LLL Abdomen: soft, non-tender; bowel sounds normal; no masses,  no organomegaly Extremities: extremities normal, atraumatic, no cyanosis or edema and Homans sign is negative, no sign of DVT Wound: Chest tube sites are intact, single chest tube is in place no air leak with cough   Diagnostic Studies & Laboratory data:     Recent Radiology Findings:  DG Chest 2 View  Result Date: 05/12/2020 CLINICAL DATA:  Status post VATS. EXAM: CHEST - 2 VIEW COMPARISON:  05/05/20 FINDINGS: Left chest tube is stable in position. The left-sided hydropneumothorax is again identified. This is stable to mildly decreased in volume from previous exam. Right lung appears clear. Visualized osseous structures are unremarkable. IMPRESSION: Stable to mildly decreased volume of left hydropneumothorax. Unchanged position of left chest tube. Electronically Signed   By: 05/07/20 M.D.   On: 05/12/2020 15:40   .   Recent Lab Findings: Lab Results  Component Value Date   WBC 5.1 04/25/2020   HGB 9.0 (L) 04/25/2020   HCT 29.3 (L) 04/25/2020   PLT 498 (H)  04/25/2020   GLUCOSE 95 04/28/2020   ALT 18 04/23/2020   AST 21 04/23/2020   NA 136 04/28/2020   K 4.5 04/28/2020   CL 99 04/28/2020   CREATININE 0.60 04/28/2020   BUN 11 04/28/2020   CO2 26 04/28/2020   INR 1.1 04/03/2020      Assessment / Plan:   #1  Chest tube still in place after significant empyema and necrotic lung with concomitant Covid infection-tube appears no air leak .  Chest tube was removed in office today.  Occlusive dressing was placed on the site and sutured closed.  Patient will return in 1 week with a follow-up chest x-ray.   #2 history of IV drug use and history of endocarditis treated at [facility] hospitals-  #3 antibiotic drug therapy per ID at the time of discharge-patient has been seen in infectious disease clinic  and no further antibiotic therapy was recommended  #4 patient was discharged home on Suboxone-with plans for drug rehab and psychiatry consults-neither which occurred for continued medical therapy and follow-up from her recent hospitalization from a medical standpoint she has been referred to the since other attempts at post hospital follow-up did not occur.  Further calls have been made since she was seen last week, [facility] and was agreeable in seeing her in the next 2 weeks to refill her medication and set up a formal evaluation in April.  Plan to see her back with a follow-up chest x-ray in 1 week-   Medication Changes: No orders of the defined types were placed in this encounter.     Delight Ovens MD      [address] [place] [place] [address] Office [phone]     05/27/2020 1:44 PM

## 2020-06-02 ENCOUNTER — Other Ambulatory Visit: Payer: Self-pay | Admitting: Cardiothoracic Surgery

## 2020-06-02 DIAGNOSIS — J948 Other specified pleural conditions: Secondary | ICD-10-CM

## 2020-06-03 ENCOUNTER — Other Ambulatory Visit: Payer: Self-pay

## 2020-06-03 ENCOUNTER — Ambulatory Visit (INDEPENDENT_AMBULATORY_CARE_PROVIDER_SITE_OTHER): Payer: Self-pay | Admitting: Cardiothoracic Surgery

## 2020-06-03 ENCOUNTER — Encounter: Payer: Self-pay | Admitting: Cardiothoracic Surgery

## 2020-06-03 ENCOUNTER — Ambulatory Visit
Admission: RE | Admit: 2020-06-03 | Discharge: 2020-06-03 | Disposition: A | Discharge status: Home / Self Care | Payer: Medicaid - Out of State | Source: Ambulatory Visit | Attending: Cardiothoracic Surgery | Admitting: Cardiothoracic Surgery

## 2020-06-03 VITALS — BP 106/77 | HR 99 | Resp 20

## 2020-06-03 DIAGNOSIS — J948 Other specified pleural conditions: Secondary | ICD-10-CM

## 2020-06-03 DIAGNOSIS — Z09 Encounter for follow-up examination after completed treatment for conditions other than malignant neoplasm: Secondary | ICD-10-CM

## 2020-06-03 NOTE — Progress Notes (Signed)
[address]       [place] [address]             [phone]      Ozzie Remmers [facility] Medical Record #[id number] Date of Birth: Jul 12, 1985  Referring: Elgergawy, Leana Roe, MD Primary Care: Patient, No Pcp Per Primary Cardiologist: No primary care provider on file.   Chief Complaint:   POST OP FOLLOW UP DATE OF PROCEDURE:  04/21/2020  PREOPERATIVE DIAGNOSIS:  Persistent air leak and poor inflation of the lower lobe following presentation with acute large empyema and COVID infection. POSTOP DIAGNOSIS: Same PROCEDURE PERFORMED: Video bronchoscopy, left video-assisted thoracoscopy, minithoracotomy decortication, resection of portion left lower lobe-necrotic, placement of endobronchial 9 mm IBV valve  OPERATIVE REPORT Preop- Left Chest empyema Postop Same Procedure: Left VATS Drainage of Empyema with decortication  DATE OF PROCEDURE:  04/05/2020  History of Present Illness:     Patient returns to the office after a prolonged hospitalization which started with admission to [facility] where she stayed several days had a thoracentesis of the left chest that showed purulent material.  The left chest at that time was "total white out".  The patient was also Covid positive.  Ultimately after a delay of several days she arrived at [facility] for further evaluation.  A left VATS was done in a chest tubes were placed to drain the effusion at this point she was critically ill.  Over 10-day.  She slowly improved but had a persistent air leak and incomplete inflation of the lower lobe of her lung.  She was returned to the OR where an area of necrotic lower lobe was removed further decortication and placement of bronchial valve was done.  This improved the space situation and significantly decreased air leak but did not completely stop it.  She was ultimately discharged home with follow-up appointment for psychiatry, Suboxone clinic in [place], and primary  care.  Unfortunately after discharge she had no follow-up other than in the surgical office today.  She had run out of her her Suboxone.   She had a previous history of endocarditis treated at [facility], while hospitalized at [facility] TEE raised the issue of possible endocarditis, she was seen by ID this regard, antibiotic treatment was recommended.  She was seen in ID clinic last week and no further antibiotics were recommended.  Since her chest tube was removed last week she denies any fever chills drainage from the chest tube site or any other indications of infection.  She still has a single bronchial valve in place that will need to decide on removing in the future.   Social History   Tobacco Use  Smoking Status Former Smoker  . Packs/day: 0.50  . Types: Cigarettes  . Quit date: 03/17/2020  . Years since quitting: 0.2  Smokeless Tobacco Never Used    Social History   Substance and Sexual Activity  Alcohol Use Not Currently     No Known Allergies  Current Outpatient Medications  Medication Sig Dispense Refill  . albuterol (VENTOLIN HFA) 108 (90 Base) MCG/ACT inhaler Inhale 2 puffs into the lungs every 4 (four) hours as needed for wheezing or shortness of breath. 18 g 0  . buprenorphine-naloxone (SUBOXONE) 8-2 mg SUBL SL tablet Place 1 tablet under the tongue 2 (two) times daily. 8 tablet 0  . busPIRone (BUSPAR) 10 MG tablet Take 1 tablet (10 mg total) by mouth 3 (three) times daily. 90 tablet  0  . cyclobenzaprine (FLEXERIL) 5 MG tablet Take 1 tablet (5 mg total) by mouth 3 (three) times daily as needed for muscle spasms. 30 tablet 0  . gabapentin (NEURONTIN) 300 MG capsule Take 1 capsule (300 mg total) by mouth 2 (two) times daily. 60 capsule 0  . metoprolol tartrate (LOPRESSOR) 25 MG tablet Take 0.5 tablets (12.5 mg total) by mouth 2 (two) times daily. 60 tablet 0  . traZODone (DESYREL) 100 MG tablet Take 1 tablet (100 mg total) by mouth at bedtime. 30  tablet 0  . acidophilus (RISAQUAD) CAPS capsule Take 2 capsules by mouth daily. 60 capsule 0  . dextromethorphan-guaiFENesin (MUCINEX DM) 30-600 MG 12hr tablet Take 1 tablet by mouth 2 (two) times daily. 30 tablet 0  . lidocaine (LIDODERM) 5 % Place 1 patch onto the skin daily. Remove & Discard patch within 12 hours or as directed by MD 30 patch 0  . nicotine (NICODERM CQ - DOSED IN MG/24 HR) 7 mg/24hr patch Place 1 patch (7 mg total) onto the skin daily. 28 patch 0   No current facility-administered medications for this visit.       Physical Exam: BP 106/77 (BP Location: Left Arm, Patient Position: Sitting)   Pulse 99   Resp 20   LMP 05/07/2020 (Exact Date)   SpO2 97% Comment: RA  General appearance: alert and cooperative Neck: no adenopathy, no carotid bruit, no JVD, supple, symmetrical, trachea midline and thyroid not enlarged, symmetric, no tenderness/mass/nodules Resp: clear to auscultation bilaterally Cardio: regular rate and rhythm, S1, S2 normal, no murmur, click, rub or gallop  Diagnostic Studies & Laboratory data:     Recent Radiology Findings:  DG Chest 2 View  Result Date: 06/03/2020 CLINICAL DATA:  Follow-up hydropneumothorax dyspnea EXAM: CHEST - 2 VIEW COMPARISON:  05/27/2020 chest radiograph. FINDINGS: Left chest tube removed. Stable cardiomediastinal silhouette with normal heart size. No right pneumothorax. No right pleural effusion. Bronchial valve overlies the left infrahilar region, unchanged. Volume loss in the left hemithorax is unchanged. Loculated peripheral/basilar small to moderate left hydropneumothorax appears unchanged. Clear right lung. Stable hazy and linear mid to lower left lung opacities. IMPRESSION: 1. Stable loculated peripheral/basilar small to moderate left hydropneumothorax status post left chest tube removal. 2. Stable left lung volume loss and hazy and linear mid to lower left lung opacities favoring atelectasis/scarring. Electronically Signed    By: Delbert Phenix M.D.   On: 06/03/2020 11:42     DG Chest 2 View  Result Date: 05/12/2020 CLINICAL DATA:  Status post VATS. EXAM: CHEST - 2 VIEW COMPARISON:  05/05/20 FINDINGS: Left chest tube is stable in position. The left-sided hydropneumothorax is again identified. This is stable to mildly decreased in volume from previous exam. Right lung appears clear. Visualized osseous structures are unremarkable. IMPRESSION: Stable to mildly decreased volume of left hydropneumothorax. Unchanged position of left chest tube. Electronically Signed   By: Signa Kell M.D.   On: 05/12/2020 15:40   .   Recent Lab Findings: Lab Results  Component Value Date   WBC 5.1 04/25/2020   HGB 9.0 (L) 04/25/2020   HCT 29.3 (L) 04/25/2020   PLT 498 (H) 04/25/2020   GLUCOSE 95 04/28/2020   ALT 18 04/23/2020   AST 21 04/23/2020   NA 136 04/28/2020   K 4.5 04/28/2020   CL 99 04/28/2020   CREATININE 0.60 04/28/2020   BUN 11 04/28/2020   CO2 26 04/28/2020   INR 1.1 04/03/2020      Assessment /  Plan:   #1  Chest tube now out for the past week -chest tube site has healed over without signs of infection-chest x-ray is stable-we will plan follow-up visit in 1 month with chest x-ray And at that time can assess for removal of the bronchial valve  #2 history of IV drug use and history of endocarditis treated at [facility] hospitals-  #3 antibiotic drug therapy per ID at the time of discharge-patient has been seen in infectious disease clinic and no further antibiotic therapy was recommended  #4 patient was discharged home on Suboxone-with plans for drug rehab and psychiatry consults-neither which occurred for continued medical therapy and follow-up from her recent hospitalization from a medical standpoint she has been referred to the since other attempts at post hospital follow-up did not occur.  Further calls have been made since she was seen last week, [facility] and was agreeable in seeing her in the next  2 weeks to refill her medication and set up a formal evaluation in April.  Plan to see her back with a follow-up chest x-ray in 1 month  Medication Changes: No orders of the defined types were placed in this encounter.     Delight Ovens MD      [address] [place] [place] [address] Office [phone]     06/03/2020 12:14 PM

## 2020-06-04 ENCOUNTER — Encounter: Payer: Self-pay | Admitting: Nurse Practitioner

## 2020-06-04 ENCOUNTER — Ambulatory Visit: Payer: PRIVATE HEALTH INSURANCE | Attending: Nurse Practitioner | Admitting: Nurse Practitioner

## 2020-06-04 NOTE — Progress Notes (Signed)
Patient anceled appointment as we do not prescribe suboxone or benzodiazepines at this clinic She was given the number to the [place] treatment center

## 2020-06-10 ENCOUNTER — Other Ambulatory Visit: Payer: PRIVATE HEALTH INSURANCE

## 2020-06-30 ENCOUNTER — Other Ambulatory Visit: Payer: Self-pay | Admitting: Cardiothoracic Surgery

## 2020-06-30 DIAGNOSIS — J869 Pyothorax without fistula: Secondary | ICD-10-CM

## 2020-06-30 NOTE — Progress Notes (Signed)
[address]       [place] [address]             [phone]      Baneza Bartoszek [facility] Medical Record #[id number] Date of Birth: 06/05/1985  Referring: Elgergawy, Leana Roe, MD Primary Care: Patient, No Pcp Per Primary Cardiologist: No primary care provider on file.   Chief Complaint:   POST OP FOLLOW UP DATE OF PROCEDURE:  04/21/2020  PREOPERATIVE DIAGNOSIS:  Persistent air leak and poor inflation of the lower lobe following presentation with acute large empyema and COVID infection. POSTOP DIAGNOSIS: Same PROCEDURE PERFORMED: Video bronchoscopy, left video-assisted thoracoscopy, minithoracotomy decortication, resection of portion left lower lobe-necrotic, placement of endobronchial 9 mm IBV valve  OPERATIVE REPORT Preop- Left Chest empyema Postop Same Procedure: Left VATS Drainage of Empyema with decortication  DATE OF PROCEDURE:  04/05/2020  History of Present Illness:     Patient returns to the office after a prolonged hospitalization which started with admission to [facility] where she stayed several days had a thoracentesis of the left chest that showed purulent material.  The left chest at that time was "total white out".  The patient was also Covid positive.  Ultimately after a delay of several days she arrived at [facility] for further evaluation.  A left VATS was done in a chest tubes were placed to drain the effusion at this point she was critically ill.  Over 10-day.  She slowly improved but had a persistent air leak and incomplete inflation of the lower lobe of her lung.  She was returned to the OR where an area of necrotic lower lobe was removed further decortication and placement of bronchial valve was done.  This improved the space situation and significantly decreased air leak but did not completely stop it.  She was ultimately discharged home with follow-up appointment for psychiatry, Suboxone clinic in [place], and primary  care.    She had a previous history of endocarditis treated at [facility], while hospitalized at [facility] TEE raised the issue of possible endocarditis, she was seen by ID this regard, antibiotic treatment was recommended.  She was seen in ID clinic no further antibiotics were recommended.  Since her chest tube was removed last month she denies any fever chills drainage from the chest tube site   Patient continues to improve since discharge.  She notes she is now working in [employer] full-time.  She denies any fever chills night sweats has not had any hemoptysis or evidence of pulmonary infection.   Social History   Tobacco Use  Smoking Status Former Smoker  . Packs/day: 0.50  . Types: Cigarettes  . Quit date: 03/17/2020  . Years since quitting: 0.2  Smokeless Tobacco Never Used    Social History   Substance and Sexual Activity  Alcohol Use Not Currently     No Known Allergies  Current Outpatient Medications  Medication Sig Dispense Refill  . albuterol (VENTOLIN HFA) 108 (90 Base) MCG/ACT inhaler Inhale 2 puffs into the lungs every 4 (four) hours as needed for wheezing or shortness of breath. 18 g 0  . buprenorphine-naloxone (SUBOXONE) 8-2 mg SUBL SL tablet Place 1 tablet under the tongue 2 (two) times daily. 8 tablet 0  . busPIRone (BUSPAR) 10 MG tablet Take 1 tablet (10 mg total) by mouth 3 (three) times daily. 90 tablet 0  . cyclobenzaprine (FLEXERIL) 5 MG tablet Take 1 tablet (5 mg total) by  mouth 3 (three) times daily as needed for muscle spasms. 30 tablet 0  . gabapentin (NEURONTIN) 300 MG capsule Take 1 capsule (300 mg total) by mouth 2 (two) times daily. 60 capsule 0  . metoprolol tartrate (LOPRESSOR) 25 MG tablet Take 0.5 tablets (12.5 mg total) by mouth 2 (two) times daily. 60 tablet 0  . traZODone (DESYREL) 100 MG tablet Take 1 tablet (100 mg total) by mouth at bedtime. 30 tablet 0  . acidophilus (RISAQUAD) CAPS capsule Take 2 capsules by mouth  daily. 60 capsule 0  . dextromethorphan-guaiFENesin (MUCINEX DM) 30-600 MG 12hr tablet Take 1 tablet by mouth 2 (two) times daily. 30 tablet 0  . lidocaine (LIDODERM) 5 % Place 1 patch onto the skin daily. Remove & Discard patch within 12 hours or as directed by MD 30 patch 0  . nicotine (NICODERM CQ - DOSED IN MG/24 HR) 7 mg/24hr patch Place 1 patch (7 mg total) onto the skin daily. 28 patch 0   No current facility-administered medications for this visit.       Physical Exam: BP 116/80 (BP Location: Left Arm, Patient Position: Sitting)   Pulse (!) 125   Resp 20   Ht 5\' 7"  (1.702 m)   SpO2 96% Comment: RA  BMI 19.11 kg/m   General appearance: alert and cooperative Neck: no adenopathy, no carotid bruit, no JVD, supple, symmetrical, trachea midline and thyroid not enlarged, symmetric, no tenderness/mass/nodules Resp: clear to auscultation bilaterally Cardio: regular rate and rhythm, S1, S2 normal, no murmur, click, rub or gallop  Diagnostic Studies & Laboratory data:     Recent Radiology Findings:  DG Chest 2 View  Result Date: 07/01/2020 CLINICAL DATA:  Follow-up of empyema EXAM: CHEST - 2 VIEW COMPARISON:  06/03/2020 FINDINGS: S shaped thoracic spine curvature. Midline trachea. Normal heart size. A moderate left-sided hydropneumothorax is again identified, loculated laterally. Slightly decreased, with the visceral pleural line on the order of 4.3 cm from the chest wall today versus 4.8 cm on the prior. Persistent left mid and lower lung hazy airspace opacities are similar. Clear right lung. IMPRESSION: Decrease in loculated left-sided hydropneumothorax. Persistent left lower lung opacity, favoring scarring and/or atelectasis. Electronically Signed   By: 06/05/2020 M.D.   On: 07/01/2020 14:42   I have independently reviewed the above radiology studies  and reviewed the findings with the patient.     Recent Lab Findings: Lab Results  Component Value Date   WBC 5.1 04/25/2020    HGB 9.0 (L) 04/25/2020   HCT 29.3 (L) 04/25/2020   PLT 498 (H) 04/25/2020   GLUCOSE 95 04/28/2020   ALT 18 04/23/2020   AST 21 04/23/2020   NA 136 04/28/2020   K 4.5 04/28/2020   CL 99 04/28/2020   CREATININE 0.60 04/28/2020   BUN 11 04/28/2020   CO2 26 04/28/2020   INR 1.1 04/03/2020      Assessment / Plan:   #1  Chest tube now out -chest x-ray stable  #2 history of IV drug use and history of endocarditis treated at [facility] hospitals-   #3 antibiotic drug therapy per ID at the time of discharge-patient has been seen in infectious disease clinic and no further antibiotic therapy was recommended  #4 patient was discharged home on Suboxone-with plans for drug rehab and psychiatry consults-neither which occurred for continued medical therapy and follow-up from her recent hospitalization from a medical standpoint she has been referred to the since other attempts at post hospital follow-up did  not occur.  Further calls have been made since she was seen last week, [facility] and was agreeable in seeing her in the next 2 weeks to refill her medication and set up a formal evaluation in April.  #5 patient has a single bronchial valve in place-put in due to persistent air leak in January now with the chest tube removed and stable x-ray we will plan to remove the IBV valve next week, its been since December 2021 that she had a positive Covid test so this will need to be repeated.   Medication Changes: No orders of the defined types were placed in this encounter.     Delight Ovens MD      [address] [place] [place] [address] Office [phone]     07/01/2020 3:31 PM

## 2020-07-01 ENCOUNTER — Encounter: Payer: Self-pay | Admitting: Cardiothoracic Surgery

## 2020-07-01 ENCOUNTER — Ambulatory Visit
Admission: RE | Admit: 2020-07-01 | Discharge: 2020-07-01 | Disposition: A | Discharge status: Home / Self Care | Payer: Medicaid Other | Source: Ambulatory Visit | Attending: Cardiothoracic Surgery | Admitting: Cardiothoracic Surgery

## 2020-07-01 ENCOUNTER — Other Ambulatory Visit: Payer: Self-pay | Admitting: *Deleted

## 2020-07-01 ENCOUNTER — Other Ambulatory Visit: Payer: Self-pay

## 2020-07-01 ENCOUNTER — Ambulatory Visit (INDEPENDENT_AMBULATORY_CARE_PROVIDER_SITE_OTHER): Payer: Self-pay | Admitting: Cardiothoracic Surgery

## 2020-07-01 VITALS — BP 116/80 | HR 125 | Resp 20 | Ht 67.0 in

## 2020-07-01 DIAGNOSIS — J869 Pyothorax without fistula: Secondary | ICD-10-CM

## 2020-07-01 DIAGNOSIS — Z09 Encounter for follow-up examination after completed treatment for conditions other than malignant neoplasm: Secondary | ICD-10-CM

## 2020-07-01 DIAGNOSIS — J9382 Other air leak: Secondary | ICD-10-CM

## 2020-07-02 ENCOUNTER — Other Ambulatory Visit (HOSPITAL_COMMUNITY)
Admission: RE | Admit: 2020-07-02 | Discharge: 2020-07-02 | Disposition: A | Discharge status: Home / Self Care | Payer: Medicaid Other | Source: Ambulatory Visit | Attending: Cardiothoracic Surgery | Admitting: Cardiothoracic Surgery

## 2020-07-02 DIAGNOSIS — Z20822 Contact with and (suspected) exposure to covid-19: Secondary | ICD-10-CM | POA: Insufficient documentation

## 2020-07-02 DIAGNOSIS — Z01812 Encounter for preprocedural laboratory examination: Secondary | ICD-10-CM | POA: Diagnosis present

## 2020-07-02 LAB — SARS CORONAVIRUS 2 (TAT 6-24 HRS): SARS Coronavirus 2: NEGATIVE

## 2020-07-02 NOTE — Progress Notes (Addendum)
I was unable to reach patient on the phone, I left a voice message. I reminded patient that she has an appointment for Covid test today.  I left the number for the Admission Medication, I asked patient to call there and update medications. I asked patient to call me after medications are updated or I will call her back in a couple hours.   I did not reach Ms Friedhoff . I left a voice message   I instructed the patient to arrive at [facility] Main entrance at 0530 , register in the Admitting Office. DO NOT eat or drink anything after midnight. I instructed patient o not take any medication on Monday am, th Admission Medication Call Center had not reached her I instructed patient to shower with antibiotic soap, dry off with a clean towel. I instructed sked patient to not wear any lotions, powders, cologne, jewelry, piercing, make-up or nail polish.  Wear clean clothes. Brush teeth. I informed patient that there will need to be a driver and someone to stay with her for the first 24 hours after surgery.   I instructed  patient to call [phone], in the am if there were any questions or problems.  I informed Ms Vaeth that she may try Pre- Op desk Saturday or Sunday for instructions.  I called patient's mother, Willette Cluster to have her daughter I asked  Mom to get Ms. Bracco to listen to her voice mail. I left the Pre- OP  desk number,for her to give to patient , in case she did not understnd the vm.  I thanked patient for having Covid test today.

## 2020-07-04 NOTE — Anesthesia Preprocedure Evaluation (Addendum)
Anesthesia Evaluation  Patient identified by MRN, date of birth, ID band Patient awake    Reviewed: Allergy & Precautions, NPO status , Patient's Chart, lab work & pertinent test results  Airway Mallampati: II  TM Distance: >3 FB Neck ROM: Full    Dental  (+) Dental Advisory Given, Edentulous Upper   Pulmonary former smoker,  S/P IBV INSERTION   Pulmonary exam normal breath sounds clear to auscultation       Cardiovascular negative cardio ROS Normal cardiovascular exam Rhythm:Regular Rate:Normal     Neuro/Psych PSYCHIATRIC DISORDERS Anxiety Depression negative neurological ROS     GI/Hepatic negative GI ROS, (+)     substance abuse ( history of IV drug use and history of endocarditis )  ,   Endo/Other  negative endocrine ROS  Renal/GU negative Renal ROS     Musculoskeletal negative musculoskeletal ROS (+)   Abdominal   Peds  Hematology negative hematology ROS (+)   Anesthesia Other Findings   Reproductive/Obstetrics                           Anesthesia Physical Anesthesia Plan  ASA: III  Anesthesia Plan: General   Post-op Pain Management:    Induction: Intravenous  PONV Risk Score and Plan: 3 and Midazolam, Dexamethasone and Ondansetron  Airway Management Planned: Oral ETT  Additional Equipment: Arterial line  Intra-op Plan:   Post-operative Plan: Extubation in OR  Informed Consent: I have reviewed the patients History and Physical, chart, labs and discussed the procedure including the risks, benefits and alternatives for the proposed anesthesia with the patient or authorized representative who has indicated his/her understanding and acceptance.     Dental advisory given  Plan Discussed with: CRNA  Anesthesia Plan Comments: (2 large bore PIV)      Anesthesia Quick Evaluation

## 2020-07-05 ENCOUNTER — Ambulatory Visit (HOSPITAL_COMMUNITY): Payer: Medicaid Other

## 2020-07-05 ENCOUNTER — Encounter (HOSPITAL_COMMUNITY)
Admission: RE | Disposition: A | Discharge status: Home / Self Care | Payer: Self-pay | Source: Home / Self Care | Attending: Cardiothoracic Surgery

## 2020-07-05 ENCOUNTER — Ambulatory Visit (HOSPITAL_COMMUNITY)
Admission: RE | Admit: 2020-07-05 | Discharge: 2020-07-05 | Disposition: A | Discharge status: Home / Self Care | Payer: Medicaid Other | Attending: Cardiothoracic Surgery | Admitting: Cardiothoracic Surgery

## 2020-07-05 ENCOUNTER — Encounter (HOSPITAL_COMMUNITY): Payer: Self-pay | Admitting: Cardiothoracic Surgery

## 2020-07-05 ENCOUNTER — Ambulatory Visit (HOSPITAL_COMMUNITY): Payer: Medicaid Other | Admitting: Anesthesiology

## 2020-07-05 ENCOUNTER — Other Ambulatory Visit: Payer: Self-pay

## 2020-07-05 DIAGNOSIS — Z8616 Personal history of COVID-19: Secondary | ICD-10-CM | POA: Insufficient documentation

## 2020-07-05 DIAGNOSIS — Z4682 Encounter for fitting and adjustment of non-vascular catheter: Secondary | ICD-10-CM | POA: Diagnosis present

## 2020-07-05 DIAGNOSIS — J9382 Other air leak: Secondary | ICD-10-CM

## 2020-07-05 DIAGNOSIS — Z87891 Personal history of nicotine dependence: Secondary | ICD-10-CM | POA: Insufficient documentation

## 2020-07-05 HISTORY — PX: VIDEO BRONCHOSCOPY WITH INSERTION OF INTERBRONCHIAL VALVE (IBV): SHX6178

## 2020-07-05 LAB — PROTIME-INR
INR: 0.9 (ref 0.8–1.2)
Prothrombin Time: 12.1 seconds (ref 11.4–15.2)

## 2020-07-05 LAB — COMPREHENSIVE METABOLIC PANEL
ALT: 108 U/L — ABNORMAL HIGH (ref 0–44)
AST: 112 U/L — ABNORMAL HIGH (ref 15–41)
Albumin: 3.5 g/dL (ref 3.5–5.0)
Alkaline Phosphatase: 70 U/L (ref 38–126)
Anion gap: 8 (ref 5–15)
BUN: 15 mg/dL (ref 6–20)
CO2: 22 mmol/L (ref 22–32)
Calcium: 9 mg/dL (ref 8.9–10.3)
Chloride: 106 mmol/L (ref 98–111)
Creatinine, Ser: 0.58 mg/dL (ref 0.44–1.00)
GFR, Estimated: >60 mL/min (ref >60)
Glucose, Bld: 75 mg/dL (ref 70–99)
Potassium: 5.6 mmol/L — ABNORMAL HIGH (ref 3.5–5.1)
Sodium: 136 mmol/L (ref 135–145)
Total Bilirubin: 1.1 mg/dL (ref 0.3–1.2)
Total Protein: 6.8 g/dL (ref 6.5–8.1)

## 2020-07-05 LAB — POCT PREGNANCY, URINE: Preg Test, Ur: NEGATIVE

## 2020-07-05 LAB — APTT: aPTT: 28 seconds (ref 24–36)

## 2020-07-05 SURGERY — BRONCHOSCOPY, FLEXIBLE, WITH INTRABRONCHIAL VALVE INSERTION
Anesthesia: General

## 2020-07-05 MED ORDER — PROPOFOL 10 MG/ML IV BOLUS
INTRAVENOUS | Status: DC | PRN
  Administered 2020-07-05: 130 mg via INTRAVENOUS
  Administered 2020-07-05: 40 mg via INTRAVENOUS
  Administered 2020-07-05: 30 mg via INTRAVENOUS

## 2020-07-05 MED ORDER — ROCURONIUM BROMIDE 10 MG/ML (PF) SYRINGE
PREFILLED_SYRINGE | INTRAVENOUS | Status: DC | PRN
  Administered 2020-07-05: 60 mg via INTRAVENOUS

## 2020-07-05 MED ORDER — ACETAMINOPHEN 500 MG PO TABS
1000.0000 mg | ORAL_TABLET | Freq: Once | ORAL | Status: AC
  Administered 2020-07-05: 1000 mg via ORAL
  Filled 2020-07-05: qty 2

## 2020-07-05 MED ORDER — ORAL CARE MOUTH RINSE: 15.0000 mL | Freq: Once | OROMUCOSAL | Status: AC

## 2020-07-05 MED ORDER — PROPOFOL 10 MG/ML IV BOLUS
INTRAVENOUS | Status: AC
  Filled 2020-07-05: qty 20

## 2020-07-05 MED ORDER — LACTATED RINGERS IV SOLN: INTRAVENOUS | Status: DC

## 2020-07-05 MED ORDER — SUGAMMADEX SODIUM 200 MG/2ML IV SOLN
INTRAVENOUS | Status: DC | PRN
  Administered 2020-07-05: 300 mg via INTRAVENOUS

## 2020-07-05 MED ORDER — 0.9 % SODIUM CHLORIDE (POUR BTL) OPTIME
TOPICAL | Status: DC | PRN
  Administered 2020-07-05: 1000 mL

## 2020-07-05 MED ORDER — FENTANYL CITRATE (PF) 100 MCG/2ML IJ SOLN
INTRAMUSCULAR | Status: DC | PRN
  Administered 2020-07-05: 100 ug via INTRAVENOUS

## 2020-07-05 MED ORDER — PHENYLEPHRINE 40 MCG/ML (10ML) SYRINGE FOR IV PUSH (FOR BLOOD PRESSURE SUPPORT)
PREFILLED_SYRINGE | INTRAVENOUS | Status: AC
  Filled 2020-07-05: qty 10

## 2020-07-05 MED ORDER — CEFAZOLIN SODIUM-DEXTROSE 1-4 GM/50ML-% IV SOLN
INTRAVENOUS | Status: DC | PRN
  Administered 2020-07-05: 1 g via INTRAVENOUS

## 2020-07-05 MED ORDER — MIDAZOLAM HCL 2 MG/2ML IJ SOLN
INTRAMUSCULAR | Status: AC
  Filled 2020-07-05: qty 2

## 2020-07-05 MED ORDER — EPINEPHRINE PF 1 MG/ML IJ SOLN
INTRAMUSCULAR | Status: DC | PRN
  Administered 2020-07-05: 1 mg

## 2020-07-05 MED ORDER — MIDAZOLAM HCL 5 MG/5ML IJ SOLN
INTRAMUSCULAR | Status: DC | PRN
  Administered 2020-07-05: 2 mg via INTRAVENOUS

## 2020-07-05 MED ORDER — PHENYLEPHRINE HCL (PRESSORS) 10 MG/ML IV SOLN
INTRAVENOUS | Status: DC | PRN
  Administered 2020-07-05: 120 ug via INTRAVENOUS

## 2020-07-05 MED ORDER — ONDANSETRON HCL 4 MG/2ML IJ SOLN
INTRAMUSCULAR | Status: AC
  Filled 2020-07-05: qty 2

## 2020-07-05 MED ORDER — ONDANSETRON HCL 4 MG/2ML IJ SOLN
INTRAMUSCULAR | Status: DC | PRN
  Administered 2020-07-05: 4 mg via INTRAVENOUS

## 2020-07-05 MED ORDER — FENTANYL CITRATE (PF) 100 MCG/2ML IJ SOLN
25.0000 ug | INTRAMUSCULAR | Status: DC | PRN
Start: 2020-07-05 — End: 2020-07-05

## 2020-07-05 MED ORDER — PROMETHAZINE HCL 25 MG/ML IJ SOLN: 6.2500 mg | INTRAMUSCULAR | Status: DC | PRN

## 2020-07-05 MED ORDER — DEXAMETHASONE SODIUM PHOSPHATE 10 MG/ML IJ SOLN
INTRAMUSCULAR | Status: DC | PRN
  Administered 2020-07-05: 10 mg via INTRAVENOUS

## 2020-07-05 MED ORDER — LIDOCAINE 2% (20 MG/ML) 5 ML SYRINGE
INTRAMUSCULAR | Status: DC | PRN
  Administered 2020-07-05: 60 mg via INTRAVENOUS

## 2020-07-05 MED ORDER — EPINEPHRINE PF 1 MG/ML IJ SOLN
INTRAMUSCULAR | Status: AC
  Filled 2020-07-05: qty 1

## 2020-07-05 MED ORDER — CHLORHEXIDINE GLUCONATE 0.12 % MT SOLN
OROMUCOSAL | Status: AC
  Administered 2020-07-05: 15 mL via OROMUCOSAL
  Filled 2020-07-05: qty 15

## 2020-07-05 MED ORDER — CHLORHEXIDINE GLUCONATE 0.12 % MT SOLN: 15.0000 mL | Freq: Once | OROMUCOSAL | Status: AC

## 2020-07-05 MED ORDER — FENTANYL CITRATE (PF) 250 MCG/5ML IJ SOLN
INTRAMUSCULAR | Status: AC
  Filled 2020-07-05: qty 5

## 2020-07-05 SURGICAL SUPPLY — 34 items
BLADE CLIPPER SURG (BLADE) ×3 IMPLANT
CANISTER SUCT 3000ML PPV (MISCELLANEOUS) ×3 IMPLANT
CATH BALLN 4FR (CATHETERS) ×3 IMPLANT
CATH EMB 4FR 80CM (CATHETERS) IMPLANT
CATH EMB 5FR 80CM (CATHETERS) IMPLANT
CNTNR URN SCR LID CUP LEK RST (MISCELLANEOUS) ×1 IMPLANT
CONT SPEC 4OZ STRL OR WHT (MISCELLANEOUS) ×2
COVER BACK TABLE 60X90IN (DRAPES) ×3 IMPLANT
FILTER SMOKE EVAC ULPA (FILTER) ×3 IMPLANT
FILTER STRAW FLUID ASPIR (MISCELLANEOUS) IMPLANT
FORCEPS BIOP RJ4 1.8 (CUTTING FORCEPS) IMPLANT
GAUZE SPONGE 4X4 12PLY STRL (GAUZE/BANDAGES/DRESSINGS) ×3 IMPLANT
GLOVE BIO SURGEON STRL SZ 6.5 (GLOVE) ×2 IMPLANT
GLOVE BIO SURGEONS STRL SZ 6.5 (GLOVE) ×1
GOWN STRL REUS W/ TWL LRG LVL3 (GOWN DISPOSABLE) ×2 IMPLANT
GOWN STRL REUS W/TWL LRG LVL3 (GOWN DISPOSABLE) ×4
KIT CLEAN ENDO COMPLIANCE (KITS) ×3 IMPLANT
KIT TURNOVER KIT B (KITS) ×3 IMPLANT
MARKER SKIN DUAL TIP RULER LAB (MISCELLANEOUS) ×3 IMPLANT
NS IRRIG 1000ML POUR BTL (IV SOLUTION) ×3 IMPLANT
OIL SILICONE PENTAX (PARTS (SERVICE/REPAIRS)) ×3 IMPLANT
PAD ARMBOARD 7.5X6 YLW CONV (MISCELLANEOUS) ×6 IMPLANT
PENCIL SMOKE EVACUATOR (MISCELLANEOUS) ×3 IMPLANT
SLEEVE SUCTION 125 (MISCELLANEOUS) ×3 IMPLANT
STOPCOCK MORSE 400PSI 3WAY (MISCELLANEOUS) ×3 IMPLANT
SYR 10ML LL (SYRINGE) ×3 IMPLANT
SYR 20ML ECCENTRIC (SYRINGE) ×3 IMPLANT
SYR 5ML LL (SYRINGE) ×3 IMPLANT
TOWEL GREEN STERILE (TOWEL DISPOSABLE) ×3 IMPLANT
TOWEL GREEN STERILE FF (TOWEL DISPOSABLE) ×3 IMPLANT
TRAP FLUID SMOKE EVACUATOR (MISCELLANEOUS) ×3 IMPLANT
TRAP SPECIMEN MUCUS 40CC (MISCELLANEOUS) ×3 IMPLANT
TUBE CONNECTING 20'X1/4 (TUBING) ×1
TUBE CONNECTING 20X1/4 (TUBING) ×2 IMPLANT

## 2020-07-05 NOTE — Transfer of Care (Signed)
Immediate Anesthesia Transfer of Care Note  Patient: Alyssa Vargas  Procedure(s) Performed: VIDEO BRONCHOSCOPY WITH REMOVAL OF INTERBRONCHIAL VALVE (IBV) (N/A )  Patient Location: PACU  Anesthesia Type:General  Level of Consciousness: awake, alert  and oriented  Airway & Oxygen Therapy: Patient Spontanous Breathing and Patient connected to nasal cannula oxygen  Post-op Assessment: Report given to RN, Post -op Vital signs reviewed and stable and Patient moving all extremities  Post vital signs: Reviewed and stable  Last Vitals:  Vitals Value Taken Time  BP    Temp    Pulse 89 07/05/20 0830  Resp 16 07/05/20 0830  SpO2 100 % 07/05/20 0830  Vitals shown include unvalidated device data.  Last Pain:  Vitals:   07/05/20 0708  TempSrc:   PainSc: 0-No pain      Patients Stated Pain Goal: 3 (07/05/20 0708)  Complications: No complications documented.

## 2020-07-05 NOTE — Anesthesia Postprocedure Evaluation (Signed)
Anesthesia Post Note  Patient: Alyssa Vargas  Procedure(s) Performed: VIDEO BRONCHOSCOPY WITH REMOVAL OF INTERBRONCHIAL VALVE (IBV) (N/A )     Patient location during evaluation: PACU Anesthesia Type: General Level of consciousness: awake and alert Pain management: pain level controlled Vital Signs Assessment: post-procedure vital signs reviewed and stable Respiratory status: spontaneous breathing, nonlabored ventilation and respiratory function stable Cardiovascular status: blood pressure returned to baseline and stable Postop Assessment: no apparent nausea or vomiting Anesthetic complications: no   No complications documented.  Last Vitals:  Vitals:   07/05/20 0845 07/05/20 0900  BP: 117/66 135/74  Pulse: 73 68  Resp: 14 12  Temp:    SpO2: 98% 98%    Last Pain:  Vitals:   07/05/20 0900  TempSrc:   PainSc: 0-No pain                 Cecile Hearing

## 2020-07-05 NOTE — Anesthesia Procedure Notes (Signed)
Procedure Name: Intubation Date/Time: 07/05/2020 7:58 AM Performed by: Inda Coke, CRNA Pre-anesthesia Checklist: Patient identified, Emergency Drugs available, Suction available and Patient being monitored Patient Re-evaluated:Patient Re-evaluated prior to induction Oxygen Delivery Method: Circle System Utilized Preoxygenation: Pre-oxygenation with 100% oxygen Induction Type: IV induction Ventilation: Mask ventilation without difficulty Laryngoscope Size: Mac and 3 Grade View: Grade I Tube type: Oral Tube size: 8.5 mm Number of attempts: 1 Airway Equipment and Method: Stylet and Oral airway Placement Confirmation: ETT inserted through vocal cords under direct vision,  positive ETCO2 and breath sounds checked- equal and bilateral Secured at: 22 cm Tube secured with: Tape Dental Injury: Teeth and Oropharynx as per pre-operative assessment

## 2020-07-05 NOTE — Brief Op Note (Addendum)
      [address]       [place] [address]             [phone]      07/05/2020  8:22 AM  PATIENT:  Alyssa Vargas  35 y.o. female  PRE-OPERATIVE DIAGNOSIS:  S/P IBV INSERTION  POST-OPERATIVE DIAGNOSIS:  S/P IBV INSERTION  PROCEDURE:  Procedure(s): VIDEO BRONCHOSCOPY WITH REMOVAL OF INTERBRONCHIAL VALVE (IBV) (N/A)  SURGEON:  Surgeon(s) and Role:    * Delight Ovens, MD - Primary  ANESTHESIA:   general  EBL: none   BLOOD ADMINISTERED:none  DRAINS: none   LOCAL MEDICATIONS USED:  NONE  SPECIMEN:  Source of Specimen:  bronchial valve   DISPOSITION OF SPECIMEN:  N/A  COUNTS:  YES  DICTATION: .Dragon Dictation  PLAN OF CARE: Discharge to home after PACU  PATIENT DISPOSITION:  PACU - hemodynamically stable.   Delay start of Pharmacological VTE agent (>24hrs) due to surgical blood loss or risk of bleeding: yes

## 2020-07-05 NOTE — H&P (Signed)
[address]       [place] [address]             [phone]      Devorah Givhan [facility] Medical Record #[id number] Date of Birth: September 30, 1985  Referring: No ref. provider found Primary Care: Alyssa Vargas, No Pcp Per Primary Cardiologist: No primary care provider on file.   Chief Complaint:   POST OP FOLLOW UP DATE OF PROCEDURE:  04/21/2020  PREOPERATIVE DIAGNOSIS:  Persistent air leak and poor inflation of the lower lobe following presentation with acute large empyema and COVID infection. POSTOP DIAGNOSIS: Same PROCEDURE PERFORMED: Video bronchoscopy, left video-assisted thoracoscopy, minithoracotomy decortication, resection of portion left lower lobe-necrotic, placement of endobronchial 9 mm IBV valve  OPERATIVE REPORT Preop- Left Chest empyema Postop Same Procedure: Left VATS Drainage of Empyema with decortication  DATE OF PROCEDURE:  04/05/2020  History of Present Illness:     Alyssa Vargas returnsafter a prolonged hospitalization which started with admission to [facility] where she stayed several days had a thoracentesis of the left chest that showed purulent material.  The left chest at that time was "total white out".  The Alyssa Vargas was also Covid positive.  Ultimately after a delay of several days she arrived at [facility] for further evaluation.  A left VATS was done in a chest tubes were placed to drain the effusion at this point she was critically ill.  Over 10-day.  She slowly improved but had a persistent air leak and incomplete inflation of the lower lobe of her lung.  She was returned to the OR where an area of necrotic lower lobe was removed further decortication and placement of bronchial valve was done.  This improved the space situation and significantly decreased air leak but did not completely stop it.  She was ultimately discharged home with follow-up appointment for psychiatry, Suboxone clinic in [place], and primary care.    She had  a previous history of endocarditis treated at [facility], while hospitalized at [facility] TEE raised the issue of possible endocarditis, she was seen by ID this regard, antibiotic treatment was recommended.  She was seen in ID clinic no further antibiotics were recommended.  Since her chest tube was removed last month she denies any fever chills drainage from the chest tube site   Alyssa Vargas continues to improve since discharge.  She notes she is now working in [employer] full-time.  She denies any fever chills night sweats has not had any hemoptysis or evidence of pulmonary infection.   Social History   Tobacco Use  Smoking Status Former Smoker  . Packs/day: 0.50  . Types: Cigarettes  . Quit date: 03/17/2020  . Years since quitting: 0.3  Smokeless Tobacco Never Used    Social History   Substance and Sexual Activity  Alcohol Use Not Currently     No Known Allergies  Current Facility-Administered Medications  Medication Dose Route Frequency Provider Last Rate Last Admin  . acetaminophen (TYLENOL) tablet 1,000 mg  1,000 mg Oral Once Cecile Hearing, MD      . chlorhexidine (PERIDEX) 0.12 % solution 15 mL  15 mL Mouth/Throat Once Cecile Hearing, MD       Or  . MEDLINE mouth rinse  15 mL Mouth Rinse Once Cecile Hearing, MD      . chlorhexidine (PERIDEX) 0.12 % solution           . lactated ringers infusion   Intravenous  Continuous Cecile Hearing, MD           Physical Exam: BP 111/74   Pulse 79   Temp 98.1 F (36.7 C) (Oral)   Resp 17   Ht 5\' 7"  (1.702 m)   Wt 55.8 kg   LMP 06/21/2020 (Approximate)   SpO2 100%   BMI 19.26 kg/m   General appearance: alert and cooperative Neck: no adenopathy, no carotid bruit, no JVD, supple, symmetrical, trachea midline and thyroid not enlarged, symmetric, no tenderness/mass/nodules Resp: clear to auscultation bilaterally Cardio: regular rate and rhythm, S1, S2 normal, no murmur, click, rub or  gallop  Diagnostic Studies & Laboratory data:     Recent Radiology Findings:  DG Chest 2 View  Result Date: 07/01/2020 CLINICAL DATA:  Follow-up of empyema EXAM: CHEST - 2 VIEW COMPARISON:  06/03/2020 FINDINGS: S shaped thoracic spine curvature. Midline trachea. Normal heart size. A moderate left-sided hydropneumothorax is again identified, loculated laterally. Slightly decreased, with the visceral pleural line on the order of 4.3 cm from the chest wall today versus 4.8 cm on the prior. Persistent left mid and lower lung hazy airspace opacities are similar. Clear right lung. IMPRESSION: Decrease in loculated left-sided hydropneumothorax. Persistent left lower lung opacity, favoring scarring and/or atelectasis. Electronically Signed   By: 06/05/2020 M.D.   On: 07/01/2020 14:42   I have independently reviewed the above radiology studies  and reviewed the findings with the Alyssa Vargas.     Recent Lab Findings: Lab Results  Component Value Date   WBC 5.1 04/25/2020   HGB 9.0 (L) 04/25/2020   HCT 29.3 (L) 04/25/2020   PLT 498 (H) 04/25/2020   GLUCOSE 95 04/28/2020   ALT 18 04/23/2020   AST 21 04/23/2020   NA 136 04/28/2020   K 4.5 04/28/2020   CL 99 04/28/2020   CREATININE 0.60 04/28/2020   BUN 11 04/28/2020   CO2 26 04/28/2020   INR 1.1 04/03/2020      Assessment / Plan:   #1  Chest tube now out -chest x-ray stable  #2 history of IV drug use and history of endocarditis treated at [facility] hospitals-   #3 antibiotic drug therapy per ID at the time of discharge-Alyssa Vargas has been seen in infectious disease clinic and no further antibiotic therapy was recommended  #4 Alyssa Vargas was discharged home on Suboxone-with plans for drug rehab and psychiatry consults-neither which occurred for continued medical therapy and follow-up from her recent hospitalization from a medical standpoint she has been referred to the since other attempts at post hospital follow-up did not occur.  Further calls have  been made since she was seen last week, [facility] and was agreeable in seeing her in the next 2 weeks to refill her medication and set up a formal evaluation in April.  #5 Alyssa Vargas has a single bronchial valve in place-put in due to persistent air leak in January now with the chest tube removed and stable x-ray we will plan to remove the IBV valve with bronchoscopy under general AN  , its been since December 2021 that she had a positive Covid test so this will need to be repeated.   Medication Changes: Meds ordered this encounter  Medications  . acetaminophen (TYLENOL) tablet 1,000 mg  . lactated ringers infusion  . OR Linked Order Group   . chlorhexidine (PERIDEX) 0.12 % solution 15 mL   . MEDLINE mouth rinse  . chlorhexidine (PERIDEX) 0.12 % solution    January 2022   :  cabinet override      Delight Ovens MD      [address] [place] [place] [address] Office [phone]     07/05/2020 7:12 AM

## 2020-07-05 NOTE — Discharge Instructions (Addendum)
Flexible Bronchoscopy  Flexible bronchoscopy is a procedure used to examine the passageways in the lungs. During the procedure, a thin, flexible tool with a camera (bronchoscope) is passed into the mouth or nose, down through the windpipe (trachea), and into the air tubes in the lungs (bronchi). This tool allows the health care provider to look inside the lungs and to take samples for testing, if needed. Tell a health care provider about:  Any allergies you have.  All medicines you are taking, including vitamins, herbs, eye drops, creams, and over-the-counter medicines.  Any problems you or family members have had with anesthetic medicines.  Any blood disorders you have.  Any surgeries you have had.  Any medical conditions you have.  Whether you are pregnant or may be pregnant. What are the risks? Generally, this is a safe procedure. However, problems may occur, including:  Infection.  Bleeding.  Damage to other structures or organs.  Allergic reactions to medicines.  Collapsed lung (pneumothorax).  Increased need for oxygen or difficulty breathing after the procedure. What happens before the procedure? Staying hydrated Follow instructions from your health care provider about hydration, which may include:  Up to 2 hours before the procedure - you may continue to drink clear liquids, such as water, clear fruit juice, black coffee, and plain tea.   Eating and drinking restrictions Follow instructions from your health care provider about eating and drinking, which may include:  8 hours before the procedure - stop eating heavy meals or foods, such as meat, fried foods, or fatty foods.  6 hours before the procedure - stop eating light meals or foods, such as toast or cereal.  6 hours before the procedure - stop drinking milk or drinks that contain milk.  2 hours before the procedure - stop drinking clear liquids. Medicines Ask your health care provider about:  Changing or  stopping your regular medicines. This is especially important if you are taking diabetes medicines or blood thinners.  Taking medicines such as aspirin and ibuprofen. These medicines can thin your blood. Do not take these medicines unless your health care provider tells you to take them.  Taking over-the-counter medicines, vitamins, herbs, and supplements. General instructions  You may be given antibiotic medicine to help lower the risk of infection.  Plan to have a responsible adult take you home from the hospital or clinic.  If you will be going home right after the procedure, plan to have a responsible adult care for you for the time you are told. This is important. What happens during the procedure?  An IV will be inserted into one of your veins.  You will be given a medicine (local anesthetic) to numb your mouth, nose, throat, and voice box (larynx). You may also be given one or more of the following: ? A medicine to help you relax (sedative). ? A medicine to control coughing. ? A medicine to dry up any fluids or secretions in your lungs.  A bronchoscope will be passed into your nose or mouth, and into your lungs. Your health care provider will examine your lungs.  Samples of airway secretions may be collected for testing.  If abnormal areas are seen in your airways, samples of tissue may be removed and checked under a microscope (biopsy).  If tissue samples are needed from the outer parts of the lung, a type of X-ray (fluoroscopy) may be used to guide the bronchoscope to these areas.  If bleeding occurs, you may be given medicine to  stop or decrease the bleeding. The procedure may vary among health care providers and hospitals. What can I expect after the procedure?  Your blood pressure, heart rate, breathing rate, and blood oxygen level will be monitored until you leave the hospital or clinic.  You may have a chest X-ray to check for signs of pneumothorax.  You willnot be  allowed to eat or drink anything for 2 hours after your procedure.  If a biopsy was taken, it is up to you to get the results of the test. Ask your health care provider, or the department that is doing the procedure, when your results will be ready.  You may have the following symptoms for 24-48 hours: ? A cough that is worse than it was before the procedure. ? A low-grade fever. ? A sore throat or hoarse voice. ? Some blood in the mucus from your lungs (sputum), if a biopsy was done. Follow these instructions at home: Eating and drinking  Do not eat or drink anything, including water, for 2 hours after your procedure, or until your numbing medicine has worn off. Having a numb throat increases your risk of burning yourself or choking.  Start eating soft foods and slowly drinking liquids after your numbness is gone and your cough and gag reflexes have returned.  You may return to your normal diet the day after the procedure. Driving  If you were given a sedative during the procedure, it can affect you for several hours. Do not drive or operate machinery until your health care provider says that it is safe.  Ask your health care provider if the medicine prescribed to you requires you to avoid driving or using machinery.  Return to your normal activities as told by your health care provider. Ask your health care provider what activities are safe for you. General instructions  Take over-the-counter and prescription medicines only as told by your health care provider.  Do not use any products that contain nicotine or tobacco. These products include cigarettes, chewing tobacco, and vaping devices, such as e-cigarettes. If you need help quitting, ask your health care provider.  Keep all follow-up visits. This is important.   Get help right away if:  You have shortness of breath that gets worse.  You become light-headed or feel like you might faint.  You have chest pain.  You cough up  more than a small amount of blood. These symptoms may represent a serious problem that is an emergency. Do not wait to see if the symptoms will go away. Get medical help right away. Call your local emergency services (911 in the [place] Do not drive yourself to the hospital. Summary  Flexible bronchoscopy is a procedure that allows your health care provider to look closely inside your lungs and to take testing samples if needed.  Risks of flexible bronchoscopy include bleeding, infection, and collapsed lung (pneumothorax).  Before the procedure, you will be given a medicine to numb your mouth, nose, throat, and voice box. Then, a bronchoscope will be passed into your nose or mouth, and into your lungs.  After the procedure, your blood pressure, heart rate, breathing rate, and blood oxygen level will be monitored until you leave the hospital or clinic. You may have a chest X-ray to check for signs of pneumothorax.  You will not be allowed to eat or drink anything for 2 hours after your procedure. This information is not intended to replace advice given to you by your health care provider.  Make sure you discuss any questions you have with your health care provider. Document Revised: 10/23/2019 Document Reviewed: 10/23/2019 Elsevier Patient Education  [employer].

## 2020-07-06 ENCOUNTER — Encounter (HOSPITAL_COMMUNITY): Payer: Self-pay | Admitting: Cardiothoracic Surgery

## 2020-07-07 NOTE — Op Note (Signed)
[name], [name] MEDICAL RECORD NO: [id number] ACCOUNT NO: [id number] DATE OF BIRTH: 28-Mar-1986 FACILITY: [facility] LOCATION: [facility] PHYSICIAN: Gwenith Daily. Tyrone Sage, MD  Operative Report   DATE OF PROCEDURE: 07/05/2020   PREOPERATIVE DIAGNOSIS:  Previous placement of intrabronchial bowel for persistent air leak.  POSTOPERATIVE DIAGNOSIS:  Previous placement of intrabronchial bowel for persistent air leak.  PROCEDURE PERFORMED:  Bronchoscopy under general anesthesia and removal of bronchial valve.  SURGEON:  Dr. Sheliah Plane.  INDICATIONS:  The patient is a 35 year old female who presented with a complex history of left empyema and COVID infection in December of 2021.  She initially had chest tubes placed and drainage and ultimately required a video-assisted thoracoscopy and  placement of a bronchial valve for persistent air leak.  She has gradually improved since that time.  Chest tube was removed and she now returns for removal of the bronchial valve.  DESCRIPTION OF PROCEDURE:  With the patient under general anesthesia, appropriate, time-out was performed, and then we proceeded with a 2.8 mm bronchoscope to the subsegmental level.  Examining both the right and left tracheobronchial tree.  There were  no abnormalities noted in the right tracheobronchial tree, in the left there is one bronchial valve that had been in place was easily identified.  This was grabbed with a biopsy forceps and removed without difficulty.  The patient tolerated the procedure  without difficulty, was awakened in the operating room and returned to the recovery room for postoperative observation.   [email or web address] D: 07/06/2020 10:57:13 am T: 07/07/2020 1:36:00 am  JOB: [id number]/ [id number]

## 2020-07-21 ENCOUNTER — Ambulatory Visit: Payer: Medicaid - Out of State | Admitting: Nurse Practitioner

## 2020-07-28 ENCOUNTER — Other Ambulatory Visit: Payer: Self-pay | Admitting: Thoracic Surgery (Cardiothoracic Vascular Surgery)

## 2020-07-28 DIAGNOSIS — J948 Other specified pleural conditions: Secondary | ICD-10-CM

## 2020-07-29 ENCOUNTER — Ambulatory Visit
Admission: RE | Admit: 2020-07-29 | Discharge: 2020-07-29 | Disposition: A | Discharge status: Home / Self Care | Payer: PRIVATE HEALTH INSURANCE | Source: Ambulatory Visit | Attending: Thoracic Surgery (Cardiothoracic Vascular Surgery) | Admitting: Thoracic Surgery (Cardiothoracic Vascular Surgery)

## 2020-07-29 ENCOUNTER — Ambulatory Visit (INDEPENDENT_AMBULATORY_CARE_PROVIDER_SITE_OTHER): Payer: PRIVATE HEALTH INSURANCE | Admitting: Thoracic Surgery (Cardiothoracic Vascular Surgery)

## 2020-07-29 ENCOUNTER — Other Ambulatory Visit: Payer: Self-pay

## 2020-07-29 ENCOUNTER — Encounter: Payer: Self-pay | Admitting: Thoracic Surgery (Cardiothoracic Vascular Surgery)

## 2020-07-29 VITALS — BP 108/76 | HR 88 | Resp 20 | Ht 67.0 in | Wt 118.0 lb

## 2020-07-29 DIAGNOSIS — J9382 Other air leak: Secondary | ICD-10-CM

## 2020-07-29 DIAGNOSIS — J948 Other specified pleural conditions: Secondary | ICD-10-CM

## 2020-07-29 NOTE — Progress Notes (Signed)
      [address]       [place] [address]             [phone]        Alyssa Vargas [facility] Medical Record #[id number] Date of Birth: 04-08-1986  Referring: Elgergawy, Leana Roe, MD Primary Care: Patient, No Pcp Per (Inactive) Primary Cardiologist:No primary care provider on file.  Reason for visit:   follow-up  History of Present Illness:     Alyssa Vargas returns in follow-up after removal of her endobronchial valves.  She is a 35 year old female with prolonged air leak after being treated for Covid pneumonia.  Since the valve removal she states that she has been slowly progressing.  Her only complaint is occasional when she lays down she has a dry cough.  Physical Exam: BP 108/76 (BP Location: Left Arm, Patient Position: Sitting)   Pulse 88   Resp 20   Ht 5\' 7"  (1.702 m)   Wt 118 lb (53.5 kg)   SpO2 96% Comment: RA  BMI 18.48 kg/m   Alert NAD Abdomen ND Easy work of breathing   Diagnostic Studies & Laboratory data: CXR: IMPRESSION: Redemonstrated, loculated appearing left lateral and sub pulmonic hydropneumothorax, not significantly changed in appearance compared to prior examination dated 07/05/2020. No new airspace opacity.      Assessment / Plan:   35 year old female status post endobronchial valve removal.  She continues to have a loculated hydropneumothorax, but the fluid appears to be improving.  She can follow-up as needed if she redevelops any respiratory symptoms.   34 07/29/2020 3:49 PM

## 2020-07-30 ENCOUNTER — Ambulatory Visit: Payer: PRIVATE HEALTH INSURANCE | Admitting: Thoracic Surgery (Cardiothoracic Vascular Surgery)

## 2020-09-01 ENCOUNTER — Ambulatory Visit: Payer: Medicaid - Out of State | Admitting: Nurse Practitioner

## 2022-08-06 IMAGING — US US THORACENTESIS ASP PLEURAL SPACE W/IMG GUIDE
1 series · 2 of 2 positions shown · non-contrast
Comparison: none

INDICATION: Large LEFT pleural effusion

[Series 1: us thoracentesis asp pleural space w/img guide · 2 of 2 slices shown]
[im 1/2]
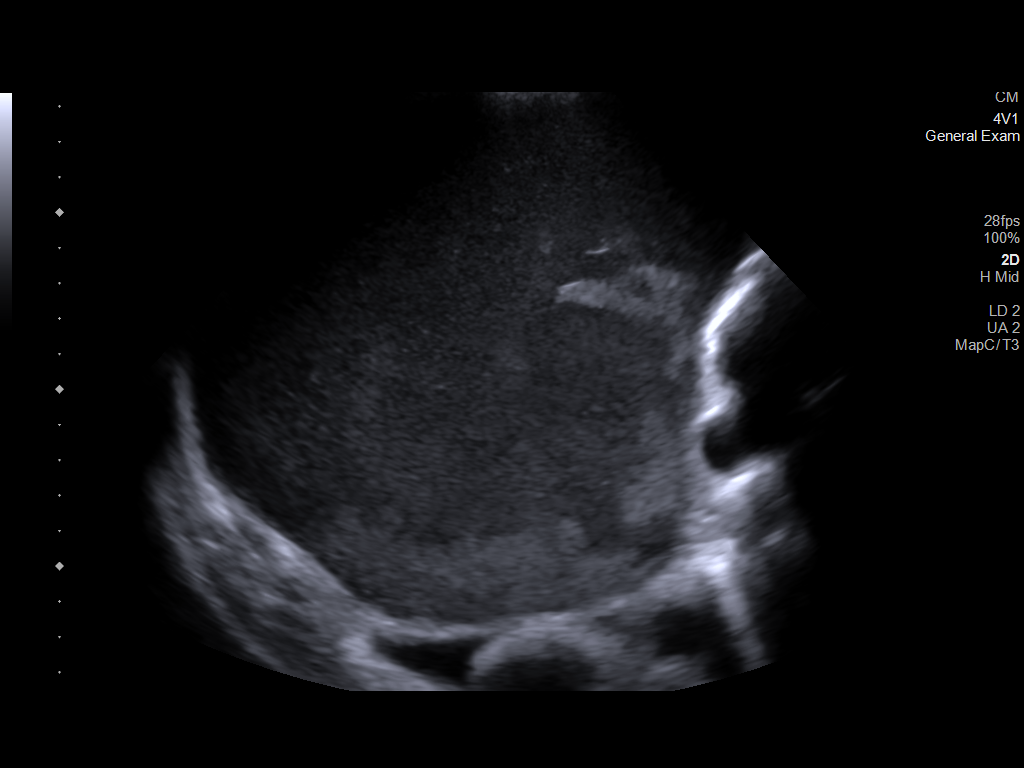
[im 2/2]
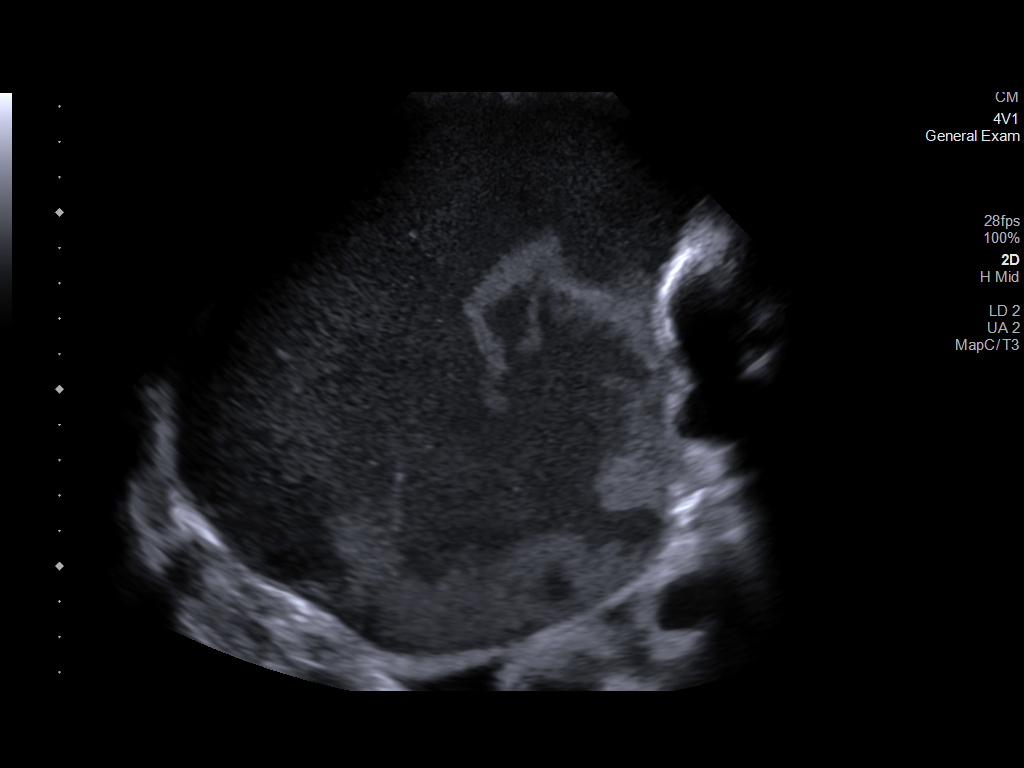

[2 of 2 positions shown; findings below may reference images not displayed]

EXAM:
ULTRASOUND GUIDED DIAGNSOTIC AND THERAPEUTIC LEFT THORACENTESIS

MEDICATIONS:
None.

COMPLICATIONS:
None immediate.

PROCEDURE:
Procedure, benefits, and risks of procedure were discussed with
patient.

Written informed consent for procedure was obtained.

Time out protocol followed.

Pleural effusion localized by ultrasound at the posterior LEFT
hemithorax.

Fluid is markedly complex containing diffuse internal echogenicity
as well as dependent echogenicity.

Skin prepped and draped in usual sterile fashion.

Skin and soft tissues anesthetized with 10 mL of 1% lidocaine.

8 French thoracentesis catheter placed into the LEFT pleural space.

1.88 L of complex cloudy tan fluid with particulates with purulent
appearance aspirated by syringe pump.

Findings most likely represent empyema.

Procedure tolerated well by patient without immediate complication.
FINDINGS: Complex purulent appearing LEFT pleural fluid likely representing
empyema.
IMPRESSION: Successful ultrasound guided LEFT thoracentesis yielding 1.88 L of
pleural fluid.

## 2022-08-09 IMAGING — DX DG CHEST 1V PORT
1 series · 1 of 1 positions shown · non-contrast
Comparison: 04/04/2020

CLINICAL DATA: Shortness of breath, [id number] positivity

EXAM:
PORTABLE CHEST 1 VIEW

[chest ap]
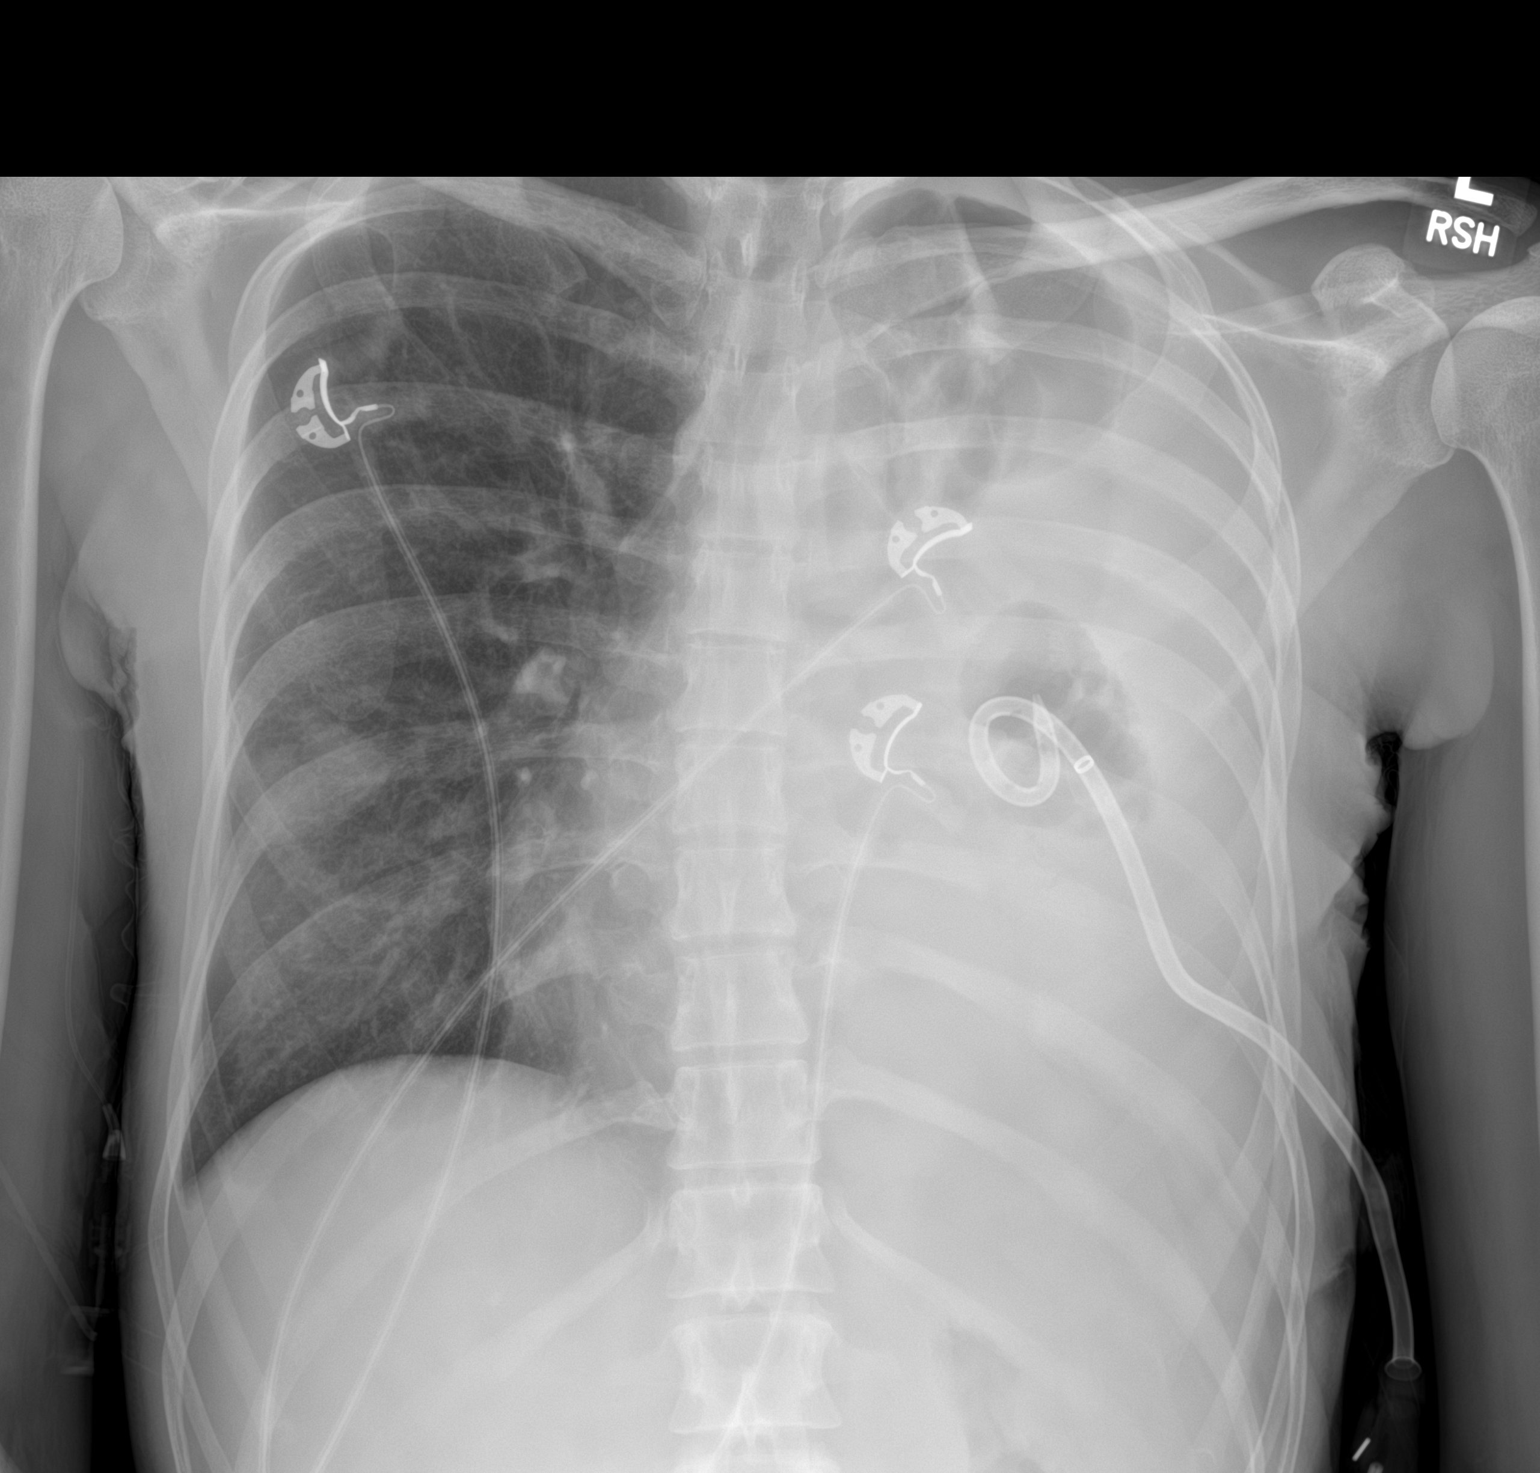

[1 of 1 positions shown; findings below may reference images not displayed]

FINDINGS: Cardiac shadow is obscured by the large left empyema. Right lung
remains clear. Left hemithorax demonstrates opacification relatively
similar to that seen on the prior exam. Pigtail catheter is noted in
place. Slight improved aeration in the left upper lobe is noted. No
bony abnormality is seen.
IMPRESSION: Slight improved aeration on the left. Pigtail catheter remains in
place with relatively CT stable left empyema.

## 2022-08-10 IMAGING — DX DG CHEST 1V PORT
1 series · 1 of 1 positions shown · non-contrast
Comparison: 04/05/2020.  CT 04/02/2020.

CLINICAL DATA: Chest tube status post VATS.  COVID positive.

EXAM:
PORTABLE CHEST 1 VIEW

[chest]
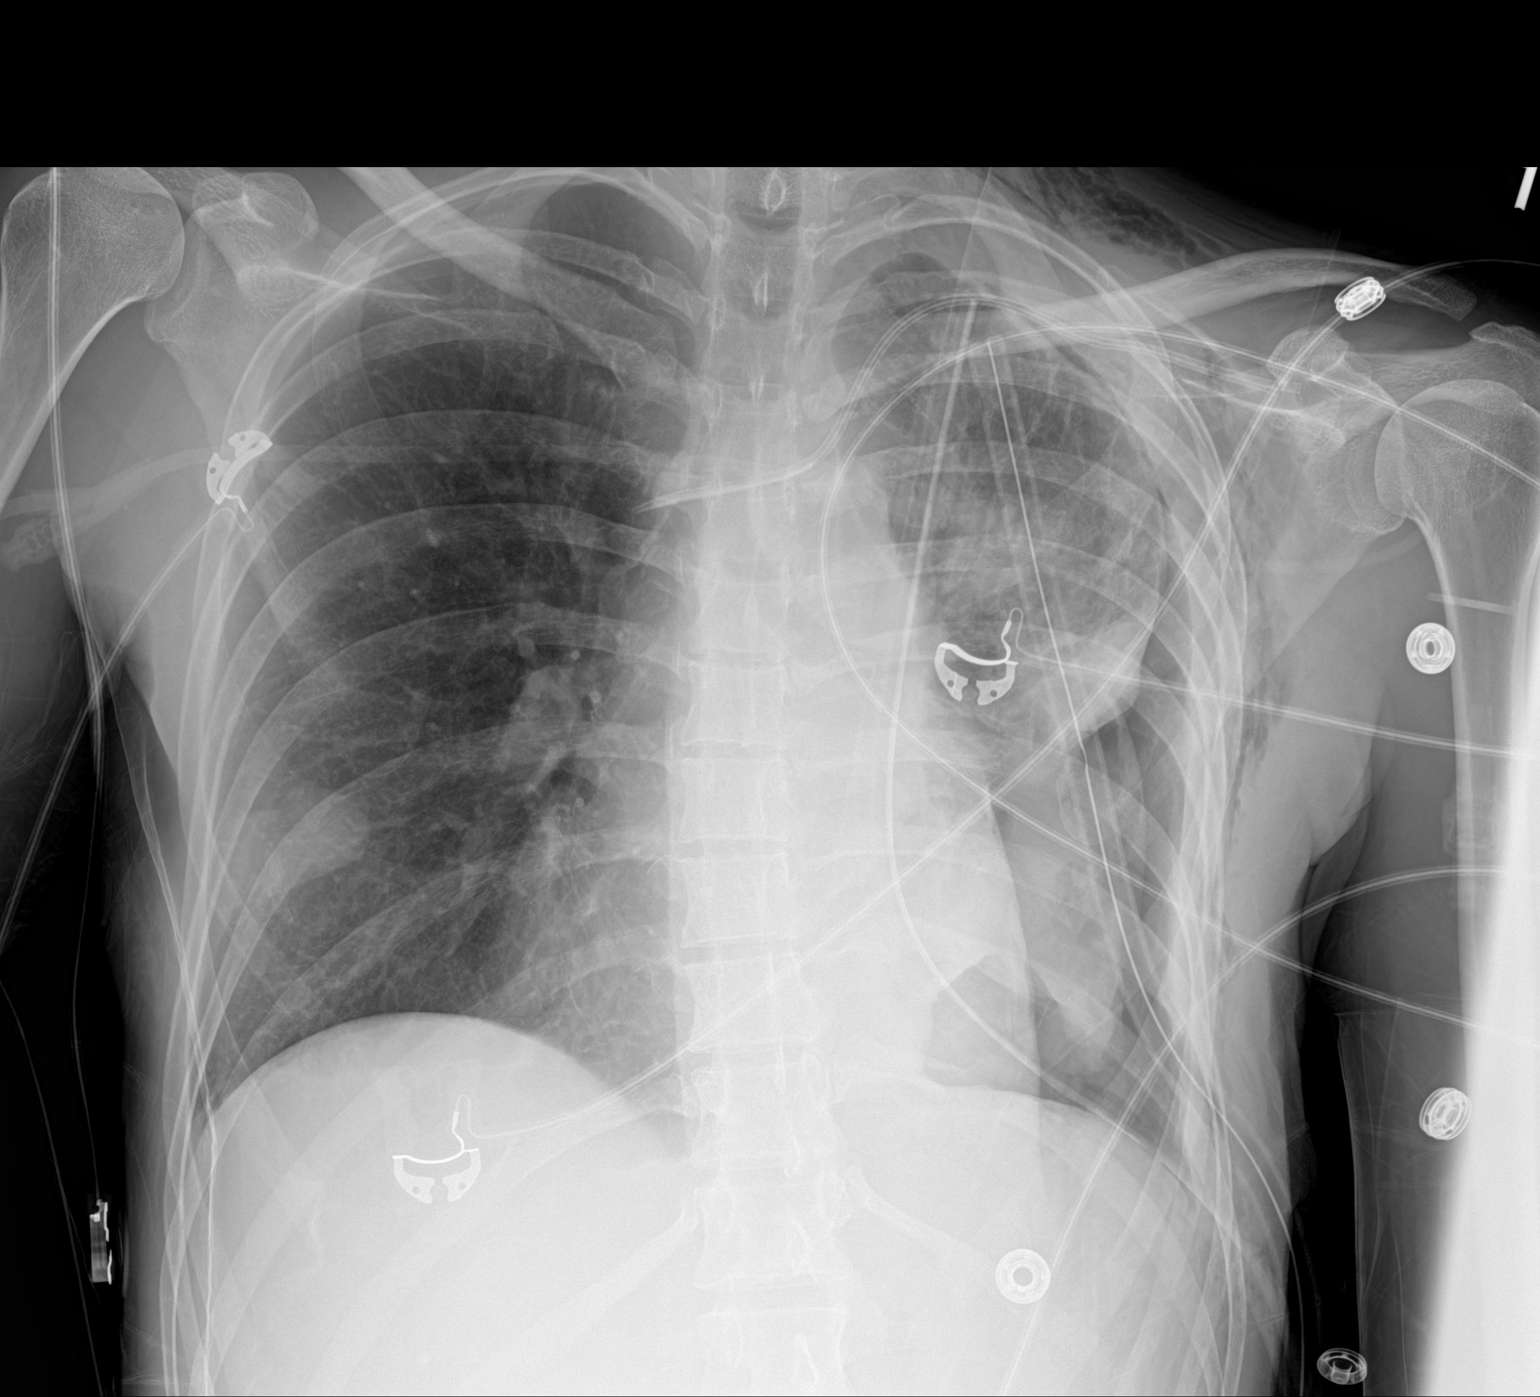

[1 of 1 positions shown; findings below may reference images not displayed]

FINDINGS: Left subclavian line and 2 left chest tubes in stable position.
Interim slight progression of left-sided pneumothorax. Stable
left-sided pleural thickening. Persistent left lung
atelectasis/infiltrate. Persistent density noted over the right
lower chest. This density was noted to be within the right lung base
on prior CT. This could represent a collection of pleural fluid and
or atelectasis. Heart size stable. Left chest wall and
supraclavicular subcutaneous emphysema again noted.
IMPRESSION: 1. Stable positioning of left subclavian line and 2 left chest
tubes. Interim slight progression of left-sided pneumothorax. Left
chest wall and supraclavicular subcutaneous emphysema again noted
without interim change.
2. Persistent left lung atelectasis/infiltrate. Stable left-sided
pleural thickening.
3. Persistent density noted the right lower chest consistent with
fluid pseudotumor and or atelectasis.

## 2022-09-03 IMAGING — DX DG CHEST 1V PORT
1 series · 1 of 1 positions shown · non-contrast
Comparison: 04/29/2020

CLINICAL DATA: Chest tube, empyema

EXAM:
PORTABLE CHEST 1 VIEW

[chest ap]
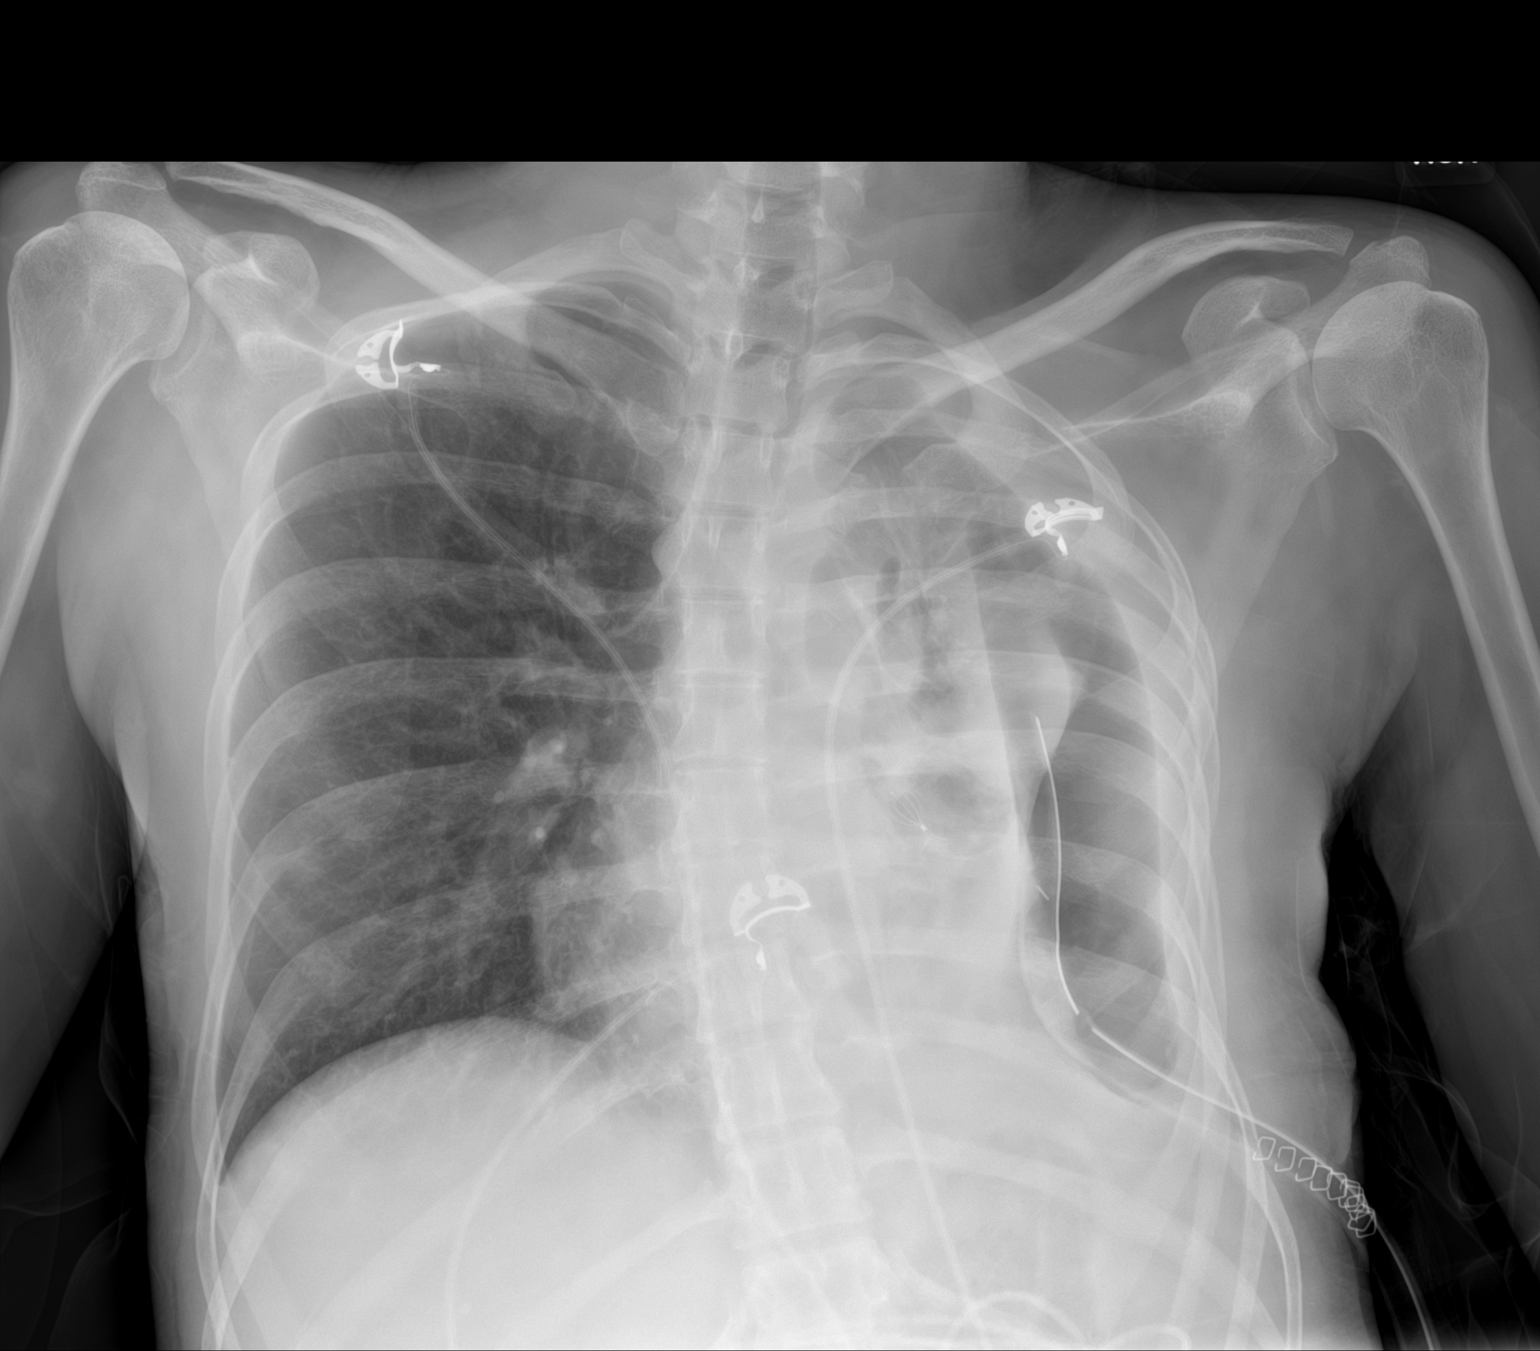

[1 of 1 positions shown; findings below may reference images not displayed]

FINDINGS: No significant interval change in AP portable chest radiograph with
left-sided chest tube and near total atelectasis of the left lung,
likely trapped with a substantial hydropneumothorax. The right lung
is normally aerated. The visualized portions of the heart and
mediastinum are unremarkable.
IMPRESSION: 1. No significant interval change in AP portable chest radiograph
with left-sided chest tube and near total atelectasis of the left
lung, likely trapped with a substantial hydropneumothorax.

2.  The right lung is normally aerated.
